# Patient Record
Sex: Male | Born: 1965 | Race: Black or African American | Hispanic: No | Marital: Single | Smoking: Current every day smoker
Health system: Southern US, Community
[De-identification: ages and names within clinical notes are randomized; demographics above are authoritative.]

## PROBLEM LIST (undated history)

## (undated) DIAGNOSIS — R262 Difficulty in walking, not elsewhere classified: Secondary | ICD-10-CM

## (undated) DIAGNOSIS — I69391 Dysphagia following cerebral infarction: Secondary | ICD-10-CM

## (undated) DIAGNOSIS — I10 Essential (primary) hypertension: Secondary | ICD-10-CM

## (undated) DIAGNOSIS — F17200 Nicotine dependence, unspecified, uncomplicated: Secondary | ICD-10-CM

## (undated) DIAGNOSIS — E119 Type 2 diabetes mellitus without complications: Secondary | ICD-10-CM

---

## 2019-09-09 DIAGNOSIS — E1165 Type 2 diabetes mellitus with hyperglycemia: Secondary | ICD-10-CM | POA: Insufficient documentation

## 2019-09-09 DIAGNOSIS — I10 Essential (primary) hypertension: Secondary | ICD-10-CM | POA: Insufficient documentation

## 2020-09-29 ENCOUNTER — Emergency Department: Payer: 59

## 2020-09-29 ENCOUNTER — Other Ambulatory Visit: Payer: Self-pay

## 2020-09-29 ENCOUNTER — Inpatient Hospital Stay
Admission: EM | Admit: 2020-09-29 | Discharge: 2020-10-06 | DRG: 064 | Disposition: A | Discharge status: Home / Self Care | Payer: 59 | Attending: Internal Medicine | Admitting: Internal Medicine

## 2020-09-29 ENCOUNTER — Encounter: Payer: Self-pay | Admitting: Emergency Medicine

## 2020-09-29 DIAGNOSIS — I43 Cardiomyopathy in diseases classified elsewhere: Secondary | ICD-10-CM | POA: Diagnosis present

## 2020-09-29 DIAGNOSIS — J90 Pleural effusion, not elsewhere classified: Secondary | ICD-10-CM | POA: Diagnosis present

## 2020-09-29 DIAGNOSIS — I639 Cerebral infarction, unspecified: Secondary | ICD-10-CM

## 2020-09-29 DIAGNOSIS — Z20822 Contact with and (suspected) exposure to covid-19: Secondary | ICD-10-CM | POA: Diagnosis present

## 2020-09-29 DIAGNOSIS — N1831 Chronic kidney disease, stage 3a: Secondary | ICD-10-CM | POA: Diagnosis present

## 2020-09-29 DIAGNOSIS — R4781 Slurred speech: Secondary | ICD-10-CM | POA: Diagnosis present

## 2020-09-29 DIAGNOSIS — Z7984 Long term (current) use of oral hypoglycemic drugs: Secondary | ICD-10-CM

## 2020-09-29 DIAGNOSIS — I63521 Cerebral infarction due to unspecified occlusion or stenosis of right anterior cerebral artery: Secondary | ICD-10-CM | POA: Diagnosis present

## 2020-09-29 DIAGNOSIS — R471 Dysarthria and anarthria: Secondary | ICD-10-CM | POA: Diagnosis present

## 2020-09-29 DIAGNOSIS — F17210 Nicotine dependence, cigarettes, uncomplicated: Secondary | ICD-10-CM | POA: Diagnosis present

## 2020-09-29 DIAGNOSIS — R1312 Dysphagia, oropharyngeal phase: Secondary | ICD-10-CM | POA: Diagnosis present

## 2020-09-29 DIAGNOSIS — R778 Other specified abnormalities of plasma proteins: Secondary | ICD-10-CM | POA: Diagnosis present

## 2020-09-29 DIAGNOSIS — I63421 Cerebral infarction due to embolism of right anterior cerebral artery: Secondary | ICD-10-CM | POA: Diagnosis not present

## 2020-09-29 DIAGNOSIS — I160 Hypertensive urgency: Secondary | ICD-10-CM | POA: Diagnosis present

## 2020-09-29 DIAGNOSIS — N179 Acute kidney failure, unspecified: Secondary | ICD-10-CM | POA: Diagnosis present

## 2020-09-29 DIAGNOSIS — Z9889 Other specified postprocedural states: Secondary | ICD-10-CM

## 2020-09-29 DIAGNOSIS — R7989 Other specified abnormal findings of blood chemistry: Secondary | ICD-10-CM | POA: Diagnosis present

## 2020-09-29 DIAGNOSIS — E1122 Type 2 diabetes mellitus with diabetic chronic kidney disease: Secondary | ICD-10-CM | POA: Diagnosis present

## 2020-09-29 DIAGNOSIS — N19 Unspecified kidney failure: Secondary | ICD-10-CM | POA: Diagnosis not present

## 2020-09-29 DIAGNOSIS — N289 Disorder of kidney and ureter, unspecified: Secondary | ICD-10-CM

## 2020-09-29 DIAGNOSIS — Z9114 Patient's other noncompliance with medication regimen: Secondary | ICD-10-CM

## 2020-09-29 DIAGNOSIS — I5031 Acute diastolic (congestive) heart failure: Secondary | ICD-10-CM | POA: Diagnosis present

## 2020-09-29 DIAGNOSIS — R296 Repeated falls: Secondary | ICD-10-CM | POA: Diagnosis present

## 2020-09-29 DIAGNOSIS — R29701 NIHSS score 1: Secondary | ICD-10-CM | POA: Diagnosis present

## 2020-09-29 DIAGNOSIS — I10 Essential (primary) hypertension: Secondary | ICD-10-CM

## 2020-09-29 DIAGNOSIS — R260 Ataxic gait: Secondary | ICD-10-CM | POA: Diagnosis present

## 2020-09-29 DIAGNOSIS — E870 Hyperosmolality and hypernatremia: Secondary | ICD-10-CM | POA: Diagnosis not present

## 2020-09-29 DIAGNOSIS — I130 Hypertensive heart and chronic kidney disease with heart failure and stage 1 through stage 4 chronic kidney disease, or unspecified chronic kidney disease: Secondary | ICD-10-CM | POA: Diagnosis present

## 2020-09-29 DIAGNOSIS — Z880 Allergy status to penicillin: Secondary | ICD-10-CM

## 2020-09-29 DIAGNOSIS — I509 Heart failure, unspecified: Secondary | ICD-10-CM

## 2020-09-29 DIAGNOSIS — N390 Urinary tract infection, site not specified: Secondary | ICD-10-CM | POA: Diagnosis present

## 2020-09-29 HISTORY — DX: Type 2 diabetes mellitus without complications: E11.9

## 2020-09-29 HISTORY — DX: Essential (primary) hypertension: I10

## 2020-09-29 LAB — TROPONIN I (HIGH SENSITIVITY)
Troponin I (High Sensitivity): 21 ng/L — ABNORMAL HIGH (ref <18)
Troponin I (High Sensitivity): 26 ng/L — ABNORMAL HIGH (ref <18)

## 2020-09-29 LAB — CBC
HCT: 44.3 % (ref 39.0–52.0)
Hemoglobin: 13.3 g/dL (ref 13.0–17.0)
MCH: 26.8 pg (ref 26.0–34.0)
MCHC: 30 g/dL (ref 30.0–36.0)
MCV: 89.3 fL (ref 80.0–100.0)
Platelets: 191 10*3/uL (ref 150–400)
RBC: 4.96 MIL/uL (ref 4.22–5.81)
RDW: 16.8 % — ABNORMAL HIGH (ref 11.5–15.5)
WBC: 6.6 10*3/uL (ref 4.0–10.5)
nRBC: 0 % (ref 0.0–0.2)

## 2020-09-29 LAB — URINALYSIS, COMPLETE (UACMP) WITH MICROSCOPIC
Bilirubin Urine: NEGATIVE
Glucose, UA: 50 mg/dL — AB
Ketones, ur: NEGATIVE mg/dL
Nitrite: NEGATIVE
Protein, ur: >=300 mg/dL — AB
Specific Gravity, Urine: 1.013 (ref 1.005–1.030)
WBC, UA: >50 WBC/hpf — ABNORMAL HIGH (ref 0–5)
pH: 5 (ref 5.0–8.0)

## 2020-09-29 LAB — COMPREHENSIVE METABOLIC PANEL
ALT: 38 U/L (ref 0–44)
AST: 28 U/L (ref 15–41)
Albumin: 2.5 g/dL — ABNORMAL LOW (ref 3.5–5.0)
Alkaline Phosphatase: 127 U/L — ABNORMAL HIGH (ref 38–126)
Anion gap: 10 (ref 5–15)
BUN: 29 mg/dL — ABNORMAL HIGH (ref 6–20)
CO2: 21 mmol/L — ABNORMAL LOW (ref 22–32)
Calcium: 7.9 mg/dL — ABNORMAL LOW (ref 8.9–10.3)
Chloride: 115 mmol/L — ABNORMAL HIGH (ref 98–111)
Creatinine, Ser: 1.72 mg/dL — ABNORMAL HIGH (ref 0.61–1.24)
GFR, Estimated: 46 mL/min — ABNORMAL LOW (ref >60)
Glucose, Bld: 108 mg/dL — ABNORMAL HIGH (ref 70–99)
Potassium: 4.6 mmol/L (ref 3.5–5.1)
Sodium: 146 mmol/L — ABNORMAL HIGH (ref 135–145)
Total Bilirubin: 0.4 mg/dL (ref 0.3–1.2)
Total Protein: 6.9 g/dL (ref 6.5–8.1)

## 2020-09-29 LAB — BRAIN NATRIURETIC PEPTIDE: B Natriuretic Peptide: 1264.5 pg/mL — ABNORMAL HIGH (ref 0.0–100.0)

## 2020-09-29 LAB — PROCALCITONIN: Procalcitonin: <0.1 ng/mL

## 2020-09-29 LAB — RESP PANEL BY RT-PCR (FLU A&B, COVID) ARPGX2
Influenza A by PCR: NEGATIVE
Influenza B by PCR: NEGATIVE
SARS Coronavirus 2 by RT PCR: NEGATIVE

## 2020-09-29 MED ORDER — FUROSEMIDE 10 MG/ML IJ SOLN
60.0000 mg | Freq: Once | INTRAMUSCULAR | Status: AC
  Administered 2020-09-29: 60 mg via INTRAVENOUS
  Filled 2020-09-29: qty 8

## 2020-09-29 MED ORDER — ACETAMINOPHEN 160 MG/5ML PO SOLN
650.0000 mg | ORAL | Status: DC | PRN
  Filled 2020-09-29: qty 20.3

## 2020-09-29 MED ORDER — POLYETHYLENE GLYCOL 3350 17 G PO PACK
17.0000 g | PACK | Freq: Every day | ORAL | Status: DC | PRN
  Filled 2020-09-29: qty 1

## 2020-09-29 MED ORDER — ALBUTEROL SULFATE (2.5 MG/3ML) 0.083% IN NEBU: 2.5000 mg | INHALATION_SOLUTION | RESPIRATORY_TRACT | Status: DC | PRN

## 2020-09-29 MED ORDER — SODIUM CHLORIDE 0.9 % IV SOLN
1.0000 g | INTRAVENOUS | Status: AC
  Administered 2020-09-29 – 2020-10-03 (×5): 1 g via INTRAVENOUS
  Filled 2020-09-29 (×5): qty 10

## 2020-09-29 MED ORDER — IOHEXOL 300 MG/ML  SOLN
75.0000 mL | Freq: Once | INTRAMUSCULAR | Status: AC | PRN
  Administered 2020-09-29: 75 mL via INTRAVENOUS
  Filled 2020-09-29: qty 75

## 2020-09-29 MED ORDER — HYDRALAZINE HCL 20 MG/ML IJ SOLN
10.0000 mg | Freq: Four times a day (QID) | INTRAMUSCULAR | Status: DC | PRN
  Administered 2020-10-01 – 2020-10-02 (×3): 10 mg via INTRAVENOUS
  Filled 2020-09-29 (×3): qty 1

## 2020-09-29 MED ORDER — LABETALOL HCL 5 MG/ML IV SOLN
INTRAVENOUS | Status: AC
  Administered 2020-09-29: 20 mg via INTRAVENOUS
  Filled 2020-09-29: qty 4

## 2020-09-29 MED ORDER — ASPIRIN 81 MG PO CHEW
324.0000 mg | CHEWABLE_TABLET | Freq: Once | ORAL | Status: AC
  Administered 2020-09-29: 324 mg via ORAL
  Filled 2020-09-29: qty 4

## 2020-09-29 MED ORDER — ACETAMINOPHEN 650 MG RE SUPP: 650.0000 mg | RECTAL | Status: DC | PRN

## 2020-09-29 MED ORDER — ACETAMINOPHEN 325 MG PO TABS: 650.0000 mg | ORAL_TABLET | ORAL | Status: DC | PRN

## 2020-09-29 MED ORDER — LABETALOL HCL 5 MG/ML IV SOLN: 20.0000 mg | Freq: Once | INTRAVENOUS | Status: AC

## 2020-09-29 MED ORDER — FUROSEMIDE 10 MG/ML IJ SOLN: 40.0000 mg | Freq: Once | INTRAMUSCULAR | Status: DC

## 2020-09-29 MED ORDER — STROKE: EARLY STAGES OF RECOVERY BOOK: Freq: Once | Status: AC

## 2020-09-29 MED ORDER — HYDRALAZINE HCL 20 MG/ML IJ SOLN
10.0000 mg | Freq: Once | INTRAMUSCULAR | Status: AC
  Administered 2020-09-29: 10 mg via INTRAVENOUS
  Filled 2020-09-29: qty 1

## 2020-09-29 NOTE — ED Provider Notes (Signed)
[facility] Emergency Department Provider Note  Time seen: 3:36 PM  I have reviewed the triage vital signs and the nursing notes.   HISTORY  Chief Complaint Off balance, dizzy  HPI Tommy Perez is a 55 y.o. male with a past medical history of hypertension who presents to the emergency department with off balance/dizziness. Cording to the patient over the past 2 weeks or so he has been very off balance having difficulty ambulating, significant others who states he is also been having trouble speaking at times. Patient is significantly hypertensive currently 212 systolic, states he has not taken his blood pressure medication in several weeks, does not recall which blood pressure medications he is taking. Patient denies any headache. Denies any chest pain or shortness of breath. No fever. Patient denies alcohol use, but is a daily smoker. No vomiting. Initially reported leg pain to the triage nurse however upon further explanation he states they are not hurting him but he feels like he cannot control them properly.   Past Medical History:  Diagnosis Date  . Hypertension     There are no problems to display for this patient.   History reviewed. No pertinent surgical history.  Prior to Admission medications   Not on File    Allergies  Allergen Reactions  . Penicillins     No family history on file.  Social History Social History   Tobacco Use  . Smoking status: Current Every Day Smoker    Types: Cigarettes  . Smokeless tobacco: Never Used  Substance Use Topics  . Alcohol use: Not Currently  . Drug use: Not Currently    Review of Systems Constitutional: Negative for fever. Positive for being off balance x2 weeks Cardiovascular: Negative for chest pain. Respiratory: Negative for shortness of breath. Gastrointestinal: Negative for abdominal pain, vomiting  Musculoskeletal: Negative for musculoskeletal complaints Neurological: Negative for headache.  Positive for being off balance. Occasional difficulty speaking per significant another. All other ROS negative  ____________________________________________   PHYSICAL EXAM:  VITAL SIGNS: ED Triage Vitals  Enc Vitals Group     BP 09/29/20 1412 (!) 212/117     Pulse Rate 09/29/20 1412 85     Resp 09/29/20 1412 16     Temp 09/29/20 1412 98.3 F (36.8 C)     Temp Source 09/29/20 1412 Oral     SpO2 09/29/20 1412 100 %     Weight 09/29/20 1410 170 lb (77.1 kg)     Height 09/29/20 1410 5\' 11"  (1.803 m)     Head Circumference --      Peak Flow --      Pain Score 09/29/20 1409 3     Pain Loc --      Pain Edu? --      Excl. in GC? --     Constitutional: Alert and oriented. Well appearing and in no distress. Eyes: Chronic strabismus. ENT      Head: Normocephalic and atraumatic.      Mouth/Throat: Mucous membranes are moist. Cardiovascular: Normal rate, regular rhythm.  Respiratory: Normal respiratory effort without tachypnea nor retractions. Breath sounds are clear  Gastrointestinal: Soft and nontender. No distention.   Musculoskeletal: 1+ lower extremity edema. Nontender to palpation. Neurologic:  Normal speech and language. Equal grip strength bilaterally. No pronator drift. 5/5 motor in bilateral upper extremities, 4+/5 motor in bilateral lower extremities. Mild difficulty with finger-to-nose testing but overall intact. No cranial nerve deficits noted. Skin:  Skin is warm, dry and intact.  Psychiatric: Mood and affect are normal.  ____________________________________________    EKG  EKG viewed and interpreted by myself shows a normal sinus rhythm 84 bpm with a narrow QRS, normal axis, normal intervals, no concerning ST changes.  ____________________________________________    RADIOLOGY  CT scan of the head is negative. Chest x-ray shows moderate right pleural effusion with patchy opacities could represent  pneumonia.  ____________________________________________   INITIAL IMPRESSION / ASSESSMENT AND PLAN / ED COURSE  Pertinent labs & imaging results that were available during my care of the patient were reviewed by me and considered in my medical decision making (see chart for details).   Patient presents to the emergency department with multiple symptoms including feeling off balance, difficulty ambulating occasional difficulty speaking over the past 2 weeks. Also states he has been off his blood pressure medication for some time at least several weeks. Patient is significantly hypertensive 212 systolic over 120 diastolic. Patient given hydralazine currently 210/120. We will dose IV labetalol. Patient's lab work shows renal insufficiency, no old lab work seen in care everywhere or in our system for comparison. CBC is largely within normal limits. Troponin slightly elevated at 21, not significantly so and could be explained by the renal insufficiency. Given the patient's ataxia with significant hypertension I ordered a CT scan to evaluate for CVA, bleed or press. CT is overall negative however given the patient's continued ataxia we will proceed with MRI for further evaluation. Patient denies any significant cough no fever, he is a daily smoker and has mild rhonchi on lung exam we will obtain a CT scan with contrast to rule out oncologic or other intrathoracic process. Patient agreeable.  MRI confirms acute to subacute infarct.  Blood pressure remains 210/120, however given acute infarct we will proceed with permissive hypertension.  CT scan is somewhat indeterminate, BNP is quite elevated with peripheral edema highly suspect a degree of interstitial edema over infectious etiology given negative Covid normal white blood cell count no cough fever or other signs of pneumonia.  We will dose 60 mg of IV Lasix.  We will dose aspirin given acute infarct.  Patient agreeable to admission for further work-up and  treatment.  Donnelle Olmeda was evaluated in Emergency Department on 09/29/2020 for the symptoms described in the history of present illness. He was evaluated in the context of the global COVID-19 pandemic, which necessitated consideration that the patient might be at risk for infection with the SARS-CoV-2 virus that causes COVID-19. Institutional protocols and algorithms that pertain to the evaluation of patients at risk for COVID-19 are in a state of rapid change based on information released by regulatory bodies including the CDC and federal and state organizations. These policies and algorithms were followed during the patient's care in the ED.  ____________________________________________   FINAL CLINICAL IMPRESSION(S) / ED DIAGNOSES  Hypertension Pleural effusion Ataxia CVA   Minna Antis, MD 09/29/20 1754

## 2020-09-29 NOTE — ED Notes (Signed)
Lab staff in room to draw blood work at this time.

## 2020-09-29 NOTE — ED Notes (Signed)
Pt reports he has missed his home medications x 2 weeks. Pt has bottles of medication in his possession in room and verified that he only takes Hydralazine, Metformin, and Jardiance currently.

## 2020-09-29 NOTE — ED Notes (Signed)
Reference triage note. Pt reports bilateral leg pain. Pt denies shortness of breath or weakness at this time. Pt reports hx of high bp, has not taken medications this am.

## 2020-09-29 NOTE — ED Notes (Signed)
MD Shalhoub at bedside 

## 2020-09-29 NOTE — ED Notes (Signed)
Date and time results received: 09/29/20 6:50 (use smartphrase ".now" to insert current time)  Test: BP Critical Value: 201/112  Name of Provider Notified: Dr. Leafy Half  Orders Received? Or Actions Taken?: Acknowledged-Do not treat due to stroke

## 2020-09-29 NOTE — ED Triage Notes (Signed)
Arrives with C/O bilateral leg pain x 2 weeks.  Denies CP and SOB.

## 2020-09-30 ENCOUNTER — Observation Stay
Admit: 2020-09-30 | Discharge: 2020-09-30 | Disposition: A | Discharge status: Home / Self Care | Payer: 59 | Attending: Internal Medicine | Admitting: Internal Medicine

## 2020-09-30 ENCOUNTER — Observation Stay: Payer: 59

## 2020-09-30 ENCOUNTER — Encounter: Payer: Self-pay | Admitting: Internal Medicine

## 2020-09-30 DIAGNOSIS — R471 Dysarthria and anarthria: Secondary | ICD-10-CM | POA: Diagnosis present

## 2020-09-30 DIAGNOSIS — N179 Acute kidney failure, unspecified: Secondary | ICD-10-CM | POA: Diagnosis present

## 2020-09-30 DIAGNOSIS — E870 Hyperosmolality and hypernatremia: Secondary | ICD-10-CM | POA: Diagnosis not present

## 2020-09-30 DIAGNOSIS — R4781 Slurred speech: Secondary | ICD-10-CM | POA: Diagnosis present

## 2020-09-30 DIAGNOSIS — R778 Other specified abnormalities of plasma proteins: Secondary | ICD-10-CM

## 2020-09-30 DIAGNOSIS — R29701 NIHSS score 1: Secondary | ICD-10-CM | POA: Diagnosis present

## 2020-09-30 DIAGNOSIS — F17210 Nicotine dependence, cigarettes, uncomplicated: Secondary | ICD-10-CM | POA: Diagnosis present

## 2020-09-30 DIAGNOSIS — N19 Unspecified kidney failure: Secondary | ICD-10-CM | POA: Diagnosis present

## 2020-09-30 DIAGNOSIS — I43 Cardiomyopathy in diseases classified elsewhere: Secondary | ICD-10-CM | POA: Diagnosis present

## 2020-09-30 DIAGNOSIS — I130 Hypertensive heart and chronic kidney disease with heart failure and stage 1 through stage 4 chronic kidney disease, or unspecified chronic kidney disease: Secondary | ICD-10-CM | POA: Diagnosis present

## 2020-09-30 DIAGNOSIS — Z7984 Long term (current) use of oral hypoglycemic drugs: Secondary | ICD-10-CM | POA: Diagnosis not present

## 2020-09-30 DIAGNOSIS — N390 Urinary tract infection, site not specified: Secondary | ICD-10-CM | POA: Diagnosis present

## 2020-09-30 DIAGNOSIS — I63421 Cerebral infarction due to embolism of right anterior cerebral artery: Secondary | ICD-10-CM | POA: Diagnosis present

## 2020-09-30 DIAGNOSIS — R260 Ataxic gait: Secondary | ICD-10-CM | POA: Diagnosis present

## 2020-09-30 DIAGNOSIS — I160 Hypertensive urgency: Secondary | ICD-10-CM | POA: Diagnosis present

## 2020-09-30 DIAGNOSIS — I509 Heart failure, unspecified: Secondary | ICD-10-CM | POA: Diagnosis not present

## 2020-09-30 DIAGNOSIS — J90 Pleural effusion, not elsewhere classified: Secondary | ICD-10-CM | POA: Diagnosis not present

## 2020-09-30 DIAGNOSIS — I5031 Acute diastolic (congestive) heart failure: Secondary | ICD-10-CM | POA: Diagnosis present

## 2020-09-30 DIAGNOSIS — E1122 Type 2 diabetes mellitus with diabetic chronic kidney disease: Secondary | ICD-10-CM | POA: Diagnosis present

## 2020-09-30 DIAGNOSIS — I63521 Cerebral infarction due to unspecified occlusion or stenosis of right anterior cerebral artery: Secondary | ICD-10-CM | POA: Diagnosis not present

## 2020-09-30 DIAGNOSIS — R1312 Dysphagia, oropharyngeal phase: Secondary | ICD-10-CM | POA: Diagnosis present

## 2020-09-30 DIAGNOSIS — Z20822 Contact with and (suspected) exposure to covid-19: Secondary | ICD-10-CM | POA: Diagnosis present

## 2020-09-30 DIAGNOSIS — R296 Repeated falls: Secondary | ICD-10-CM | POA: Diagnosis present

## 2020-09-30 DIAGNOSIS — N1831 Chronic kidney disease, stage 3a: Secondary | ICD-10-CM | POA: Diagnosis present

## 2020-09-30 DIAGNOSIS — Z880 Allergy status to penicillin: Secondary | ICD-10-CM | POA: Diagnosis not present

## 2020-09-30 DIAGNOSIS — Z9114 Patient's other noncompliance with medication regimen: Secondary | ICD-10-CM | POA: Diagnosis not present

## 2020-09-30 LAB — LIPID PANEL
Cholesterol: 173 mg/dL (ref 0–200)
HDL: 66 mg/dL (ref >40)
LDL Cholesterol: 87 mg/dL (ref 0–99)
Total CHOL/HDL Ratio: 2.6 RATIO
Triglycerides: 99 mg/dL (ref <150)
VLDL: 20 mg/dL (ref 0–40)

## 2020-09-30 LAB — GLUCOSE, CAPILLARY
Glucose-Capillary: 86 mg/dL (ref 70–99)
Glucose-Capillary: 85 mg/dL (ref 70–99)
Glucose-Capillary: 86 mg/dL (ref 70–99)
Glucose-Capillary: 101 mg/dL — ABNORMAL HIGH (ref 70–99)

## 2020-09-30 LAB — SODIUM, URINE, RANDOM: Sodium, Ur: 90 mmol/L

## 2020-09-30 LAB — HIV ANTIBODY (ROUTINE TESTING W REFLEX): HIV Screen 4th Generation wRfx: NONREACTIVE

## 2020-09-30 LAB — C-REACTIVE PROTEIN: CRP: 1 mg/dL — ABNORMAL HIGH (ref <1.0)

## 2020-09-30 LAB — TROPONIN I (HIGH SENSITIVITY): Troponin I (High Sensitivity): 28 ng/L — ABNORMAL HIGH (ref <18)

## 2020-09-30 LAB — HEMOGLOBIN A1C
Hgb A1c MFr Bld: 8.4 % — ABNORMAL HIGH (ref 4.8–5.6)
Mean Plasma Glucose: 194.38 mg/dL

## 2020-09-30 LAB — PROTEIN / CREATININE RATIO, URINE
Creatinine, Urine: 40 mg/dL
Protein Creatinine Ratio: 9.48 mg/mg{Cre} — ABNORMAL HIGH (ref 0.00–0.15)
Total Protein, Urine: 379 mg/dL

## 2020-09-30 LAB — CREATININE, URINE, RANDOM: Creatinine, Urine: 38 mg/dL

## 2020-09-30 MED ORDER — GADOBUTROL 1 MMOL/ML IV SOLN
7.0000 mL | Freq: Once | INTRAVENOUS | Status: AC | PRN
  Administered 2020-09-30: 7 mL via INTRAVENOUS

## 2020-09-30 MED ORDER — FUROSEMIDE 10 MG/ML IJ SOLN
40.0000 mg | Freq: Two times a day (BID) | INTRAMUSCULAR | Status: DC
  Administered 2020-09-30 – 2020-10-01 (×4): 40 mg via INTRAVENOUS
  Filled 2020-09-30 (×4): qty 4

## 2020-09-30 MED ORDER — INSULIN ASPART 100 UNIT/ML ~~LOC~~ SOLN
0.0000 [IU] | Freq: Three times a day (TID) | SUBCUTANEOUS | Status: DC
  Administered 2020-10-01: 2 [IU] via SUBCUTANEOUS
  Administered 2020-10-02: 3 [IU] via SUBCUTANEOUS
  Administered 2020-10-02: 2 [IU] via SUBCUTANEOUS
  Administered 2020-10-03: 3 [IU] via SUBCUTANEOUS
  Filled 2020-09-30 (×5): qty 1

## 2020-09-30 MED ORDER — ASPIRIN EC 81 MG PO TBEC
81.0000 mg | DELAYED_RELEASE_TABLET | Freq: Every day | ORAL | Status: DC
  Administered 2020-09-30 – 2020-10-06 (×7): 81 mg via ORAL
  Filled 2020-09-30 (×7): qty 1

## 2020-09-30 MED ORDER — HYDROCERIN EX CREA
TOPICAL_CREAM | Freq: Two times a day (BID) | CUTANEOUS | Status: DC
  Filled 2020-09-30 (×2): qty 113

## 2020-09-30 NOTE — Progress Notes (Signed)
*  PRELIMINARY RESULTS* Echocardiogram 2D Echocardiogram has been performed.  Tommy Perez 09/30/2020, 12:00 PM

## 2020-09-30 NOTE — Consult Note (Signed)
Neurology Consultation Reason for Consult: Stroke on MRI iso gait difficulty Requesting Physician: Esaw Grandchild  CC: Difficulty walking  History is obtained from: Patient and chart review  HPI: Tommy Perez is a 55 y.o. male with past medical history significant for hypertension (poorly controlled, blood pressures typically in the 170s at home), diabetes (poorly controlled, A1c 9.9% per [facility] cardiology note), ongoing tobacco abuse (1 pack/day, 40-pack-year history), prior substance abuse, nonadherence to medical treatment.  Patient reports that he has been having some difficulty with his walking for the past 2 weeks or perhaps even 3 weeks.  He has not noticed any specific weakness of the right or left leg.  He has not had any numbness that he has noticed.  On review of systems he reports he had a runny nose 2 weeks ago and was vaccinated against COVID-19 in November 2021 with a J&J vaccine.  He does report he has lost 10 pounds in the last 2 months without trying and that he has been having some swelling of his bilateral lower extremities for a few weeks as well.  He has a chronic lazy eye and sees better out of his right eye.   Review of systems is negative otherwise, including denying bowel or bladder issues, changes in sensation.  He was recently seen by cardiology 09/05/2019, Dr. Laruth Bouchard at [facility], who planned an echocardiogram, consideration for potential stress test  LKW: 2 to 3 weeks prior to admission tPA given?: No, due to out of the window Premorbid modified rankin scale:      0 - No symptoms.  ROS: A 14 point ROS was performed and is negative except as noted in the HPI.   Past Medical History:  Diagnosis Date  . Diabetes mellitus, type 2 (HCC)   . Hypertension    History reviewed. No pertinent surgical history.  Current Meds  Medication Sig  . empagliflozin (JARDIANCE) 10 MG TABS tablet Take 10 mg by mouth daily.  . hydrALAZINE (APRESOLINE) 50 MG tablet Take 50 mg by  mouth in the morning and at bedtime.  . metFORMIN (GLUCOPHAGE) 1000 MG tablet Take 1,000 mg by mouth in the morning and at bedtime.     Family History  Problem Relation Age of Onset  . Heart attack Neg Hx     Social History:  reports that he has been smoking cigarettes. He has been smoking about 1.00 pack per day. He has never used smokeless tobacco. He reports previous alcohol use. He reports previous drug use.   He enjoys drag racing which he does about twice a week and enjoys watching the [employer] play football.  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  He reports he is cutting back on his smoking and has reduced from 1 pack/day to half a pack a day.  He is encouraged that he has not smoked at all this admission but notes that the fact that family members in the home continue to smoke will be a barrier to quitting in the future.  Exam: Current vital signs: BP (!) 171/93 (BP Location: Left Arm)   Pulse 83   Temp 97.8 F (36.6 C)   Resp 18   Ht 5\' 11"  (1.803 m)   Wt 77.1 kg   SpO2 96%   BMI 23.71 kg/m  Vital signs in last 24 hours: Temp:  [97.8 F (36.6 C)-98.3 F (36.8 C)] 97.8 F (36.6 C) (02/09 0723) Pulse Rate:  [72-87] 83 (02/09 0723)  Resp:  [15-20] 18 (02/09 0723) BP: (171-212)/(89-123) 171/93 (02/09 0723) SpO2:  [95 %-100 %] 96 % (02/09 0723) Weight:  [77.1 kg] 77.1 kg (02/08 1410)   Physical Exam  Constitutional: Appears thin and older than stated age Psych: Affect appropriate to situation Eyes: No scleral injection, chronic lazy eye. HENT: No OP obstruction, poor dentition MSK: no joint deformities.  Cardiovascular: Normal rate and regular rhythm.  Respiratory: Effort normal, non-labored breathing GI: Soft.  No distension. There is no tenderness.  Skin: WDI  Neuro: Mental Status: Patient is awake, alert, oriented to person, place, month, year, and situation.  However he struggles to name the current president, initially answering been  loud and then self correcting to biting.  He is able to remember Trump but not Obama.  He has difficulty with short-term memory, requiring 3 repetitions before he is able to repeat back to me the new medications that are being started (aspirin, Plavix, Lipitor) No signs of aphasia or neglect Cranial Nerves: II: Visual Fields are full. Pupils are equal, round, and reactive to light.   III,IV, VI: Chronically lazy eye, full ductions with each individual eye.   V: Facial sensation is symmetric to temperature VII: Facial movement is symmetric.  VIII: hearing is intact to voice X: Uvula elevates symmetrically XI: Shoulder shrug is symmetric. XII: tongue is midline without atrophy or fasciculations.  Motor: Tone is normal. Bulk is normal. 5/5 strength was present in all four extremities, other than bilateral hip flexor weakness 4/5 and left leg knee flexion weakness 4/5 Sensory: Sensation is symmetric to light touch and temperature in the arms and legs. Deep Tendon Reflexes: 2+ bilateral biceps, 2+ left brachioradialis, 1+ right brachioradialis, 2+ left ankle, 0 right ankle Plantars: Toes are mute bilaterally Cerebellar: FNF and HKS are intact other than some difficulty with the left lower extremity  NIHSS total 1 Score breakdown: Left lower extremity ataxia  I have reviewed labs in epic and the results pertinent to this consultation are:  Creatinine 1.72, GFR 46  CBC notable for mildly increased RDW  BNP 1264 Troponin 21, 26, 28 on serial checks TSH elevated at 5.87,  Urinalysis notable for rare bacteria, white blood cells, hyaline casts, small leukocytes, negative nitrites, proteinuria, glucosuria, hemoglobinuria  Lab Results  Component Value Date   HGBA1C 8.4 (H) 09/30/2020   Lab Results  Component Value Date   CHOL 173 09/30/2020   HDL 66 09/30/2020   LDLCALC 87 09/30/2020   TRIG 99 09/30/2020   CHOLHDL 2.6 09/30/2020    I have reviewed the images obtained: MRI brain  w/ small stroke in the right ACA territory, chronic microvascular disease fairly advanced for age, a few microhemorrhages    MRA head and MRA neck personally reviewed, no significant atherosclerosis  ECHO:  1. Negative bubble study.  2. Left ventricular ejection fraction, by estimation, is 55 to 60%. The  left ventricle has normal function. The left ventricle has no regional  wall motion abnormalities. The left ventricular internal cavity size was  mildly dilated. There is mild  concentric left ventricular hypertrophy. Left ventricular diastolic  parameters are consistent with Grade I diastolic dysfunction (impaired  relaxation).  3. Right ventricular systolic function is normal. The right ventricular  size is normal.  4. The mitral valve is normal in structure. Trivial mitral valve  regurgitation.  5. The aortic valve is normal in structure. Aortic valve regurgitation is  not visualized.  6. Aortic dilatation noted and Mild dilatation.  Normal biatrial  sizes  Impression: This is a pleasant 55 year old man with vascular risk factors as above presenting with a small stroke that may be expected to cause some left leg weakness.  However his exam is notable for bilateral hip flexion weakness, though knee flexion is weaker on the left than the right, and some asymmetric reflexes that are not adequately explained. In this setting, will obtain MRI spine to evaluate for compressive etiologies of his gait impairment. Additionally will assess for reversible causes of cognitive impairment.   Recommendations: # Right ACA territory (cingulate gyrus), embolic of unknown source - Stroke labs HgbA1c, fasting lipid panel (A1c 8.4, LDL 87) - MRI brain completed as above - MRA of the brain without contrast and MRA neck w/wo completed as above - Frequent neuro checks - Echocardiogram completed, read as above - Prophylactic therapy-Antiplatelet med: Aspirin - dose 325mg  PO or 300mg  PR, followed by  81 mg daily - Consider Plavix 300 mg load with 75 mg daily for 21 - 90 day course  - Atorvastatin 40 mg nightly - Risk factor modification, hemoglobin A1c goal less than 7, lipid goal less than 70, medication adherence, diet, exercise, smoking cessation counseling performed - Telemetry monitoring; 30 day event monitor on discharge if no arrythmias captured  - Blood pressure goal  - Normotension, to be achieved gradually over 3-5 days  - PT consult, OT consult, Speech consult, appreciate recommendation for CIR  - Outpatient neurology follow-up  # Bilateral lower extremity weakness -MRI C-spine, T-spine and L-spine -Should these studies be negative, no further inpatient neurological workup is needed.   # Impaired short-term memory  -B12 level ordered, pending -Thiamine level ordered, pending -TSH elevated, free T4 pending -RPR ordered, pending -HIV negative   MD-PhD [facility] [phone]

## 2020-09-30 NOTE — Hospital Course (Addendum)
55 year old male with past medical history of hypertension, diabetes mellitus type 2, nicotine dependence and medication noncompliance who presented to the ED on 09/29/20 with complaints of unsteady gait with frequent falls and slurred speech.  Symptoms had started 2-3 weeks prior but patient did not seek any medical attention.  MRI brain showed an area of restricted diffusion in the right anterior angulate gyrus, consistent with acute/subacute stroke.

## 2020-09-30 NOTE — Consult Note (Signed)
WOC Nurse Consult Note: Reason for Consult:Dry flaking skin on bilateral LEs, hemosiderin staining consistent with venous insufficiency. Two or three very small open areas, partial and full thickness, <0.4cm round x 0.1cm deep. Heels intact. Elongated toenails (particularly RGT) in need of debridement. Wound type: N/A Pressure Injury POA: N/A Measurement:N/A Wound BUL:AGTX, dry Drainage (amount, consistency, odor) None Periwound:hemosiderin staining bilaterally Dressing procedure/placement/frequency: I have provided input to the POC for twice daily Eucerin cream application after soap and water cleanse and gently patting skin dry. Heels are to be floated and a prophylactic foam dressing placed to the sacrum. I advised Nursing to turn patient side to side.  If desired, consult with podiatric medicine for toenail debridement either inpatient or refer for outpatient consultation.  WOC nursing team will not follow, but will remain available to this patient, the nursing and medical teams.  Please re-consult if needed. Thanks, Ladona Mow, MSN, RN, GNP, [place]  Pager# [phone]

## 2020-09-30 NOTE — Evaluation (Signed)
Occupational Therapy Evaluation Patient Details Name: Tommy Perez MRN: [id number] DOB: 01-18-1966 Today's Date: 09/30/2020    History of Present Illness presented to ER secondary to 2-3 week history of unsteady gait, slurred speech and multiple falls; admitted for TIA/CVA work up.  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   Clinical Impression   Patient presenting with decreased I in self care, balance, functional mobility/transfer, endurance, and safety awareness. Patient reports living with roommates and working full time in [employer]. Patient currently functioning and min A - mod A for functional mobility and self care. Pt's HR increased to 170's while standing at sink for grooming tasks. Pt's HR returned to 120's once seated. Patient will benefit from acute OT to increase overall independence in the areas of ADLs, functional mobility, and safety awareness in order to safely discharge to next venue of care.    Follow Up Recommendations  CIR;Supervision/Assistance - 24 hour    Equipment Recommendations  Other (comment) (defer to next venue of care)       Precautions / Restrictions Precautions Precautions: Fall Precaution Comments: elevated HR in 170s      Mobility Bed Mobility Overal bed mobility: Needs Assistance Bed Mobility: Supine to Sit;Sit to Supine     Supine to sit: Supervision Sit to supine: Supervision   General bed mobility comments: min cuing for technique    Transfers Overall transfer level: Needs assistance Equipment used: None Transfers: Sit to/from Stand Sit to Stand: Min assist;Mod assist         General transfer comment: unsteady and demonstrates furniture walking in the room with cuing for technique    Balance Overall balance assessment: Needs assistance Sitting-balance support: No upper extremity supported;Feet supported Sitting  balance-Leahy Scale: Fair     Standing balance support: Bilateral upper extremity supported Standing balance-Leahy Scale: Poor                             ADL either performed or assessed with clinical judgement   ADL Overall ADL's : Needs assistance/impaired     Grooming: Wash/dry hands;Wash/dry face;Standing;Moderate assistance Grooming Details (indicate cue type and reason): mod A for standing balance with increased time to complete tasks                 Toilet Transfer: Moderate assistance Toilet Transfer Details (indicate cue type and reason): simulated transfer                 Vision Patient Visual Report: No change from baseline              Pertinent Vitals/Pain Pain Assessment: No/denies pain     Hand Dominance Right   Extremity/Trunk Assessment Upper Extremity Assessment Upper Extremity Assessment: Overall WFL for tasks assessed   Lower Extremity Assessment Lower Extremity Assessment: Overall WFL for tasks assessed       Communication Communication Communication: No difficulties   Cognition Arousal/Alertness: Awake/alert Behavior During Therapy: WFL for tasks assessed/performed Overall Cognitive Status: Within Functional Limits for tasks assessed                                 General Comments: oriented to self, location as hospital; aware of year, and does report correct month learned from prior session. Follows simple commands, but poor recall of new information,  poor insight and awareness of deficits              Home Living Family/patient expects to be discharged to:: Private residence Living Arrangements: Non-relatives/Friends Available Help at Discharge: Friend(s);Available 24 hours/day Type of Home: House Home Access: Stairs to enter [employer] of Steps: 1   Home Layout: One level     Bathroom Shower/Tub: Tub/shower unit         Home Equipment: None      Lives With: Other  (Comment) (per pt lives with 3 roommates, one of whom is girlfriend?)    Prior Functioning/Environment Level of Independence: Independent        Comments: Pt reports independence with all aspects of care, he drives, and 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        OT Problem List: Decreased strength;Decreased activity tolerance;Impaired balance (sitting and/or standing);Decreased safety awareness;Decreased knowledge of use of DME or AE;Decreased coordination;Decreased cognition      OT Treatment/Interventions: Self-care/ADL training;Manual therapy;Therapeutic exercise;Energy conservation;DME and/or AE instruction;Cognitive remediation/compensation;Therapeutic activities;Balance training;Patient/family education;Neuromuscular education    OT Goals(Current goals can be found in the care plan section) Acute Rehab OT Goals Patient Stated Goal: to be independent again OT Goal Formulation: With patient Time For Goal Achievement: 10/14/20 Potential to Achieve Goals: Good ADL Goals Pt Will Perform Grooming: with supervision;standing Pt Will Perform Lower Body Dressing: with supervision;sit to/from stand Pt Will Transfer to Toilet: with supervision;ambulating Pt Will Perform Toileting - Clothing Manipulation and hygiene: with supervision;sit to/from stand Pt Will Perform Tub/Shower Transfer: with supervision;ambulating  OT Frequency: Min 3X/week   Barriers to D/C: Other (comment)  none known known at this time          AM-PAC OT "6 Clicks" Daily Activity     Outcome Measure Help from another person eating meals?: A Little Help from another person taking care of personal grooming?: A Little Help from another person toileting, which includes using toliet, bedpan, or urinal?: A Lot Help from another person bathing (including washing, rinsing, drying)?: A Lot Help from another person to put on and taking off regular upper body clothing?: A Little Help from another person to put  on and taking off regular lower body clothing?: A Lot 6 Click Score: 15   End of Session Nurse Communication: Mobility status  Activity Tolerance: Patient tolerated treatment well Patient left: in bed;with call bell/phone within reach;with family/visitor present  OT Visit Diagnosis: Unsteadiness on feet (R26.81);Muscle weakness (generalized) (M62.81)                Time: 9678-9381 OT Time Calculation (min): 21 min Charges:  OT General Charges $OT Visit: 1 Visit OT Evaluation $OT Eval Moderate Complexity: 1 Mod OT Treatments $Self Care/Home Management : 8-22 mins  Jackquline Denmark, MS, OTR/L , CBIS ascom [phone]  09/30/20, 5:02 PM

## 2020-09-30 NOTE — Plan of Care (Signed)
  Problem: Education: Goal: Knowledge of disease or condition will improve Outcome: Progressing Goal: Knowledge of secondary prevention will improve Outcome: Progressing Goal: Knowledge of patient specific risk factors addressed and post discharge goals established will improve Outcome: Progressing   Problem: Self-Care: Goal: Ability to participate in self-care as condition permits will improve Outcome: Progressing   

## 2020-09-30 NOTE — Progress Notes (Signed)
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) 09/29/2020   AKI (acute kidney injury) (HCC) 09/29/2020   Type 2 diabetes mellitus with stage 3a chronic kidney disease, without long-term current use of insulin (HCC) 09/29/2020   Hypertensive urgency 09/29/2020   Pleural effusion, bilateral 09/29/2020   Acute congestive heart failure (HCC) 09/29/2020   Elevated troponin level not due myocardial infarction 09/29/2020   Nicotine dependence, cigarettes, uncomplicated 09/29/2020   Complicated UTI (urinary tract infection) 09/29/2020   Essential hypertension 09/09/2019   Type 2 diabetes mellitus with hyperglycemia (HCC) 09/09/2019   Past Medical History:  Past Medical History:  Diagnosis Date   Diabetes mellitus, type 2 (HCC)    Hypertension    Past Surgical History: History reviewed. No pertinent surgical history. HPI:  55 y.o. male who presented to ED with 2-3 week history of unsteady gait, slurred speech and multiple falls. MRI showed acute/subacute R cingulate gyrus infarct, remote bilateral cerebellar, R pons, L thalamus and bilateral centrum semiovale infarcts. Chest CT 09/29/20 showed bilateral pleural effusions, R>L, scattered bilateral ground-glass airspace disease greatest in RUL, dense R middle and R lower lobe consolidation, mucoid material within R mainstem brochus and bronchus intermedius.   Assessment / Plan / Recommendation Clinical Impression   Patient presents with significant cognitive communication impairments and dysarthria. Deficits are noted with sustained attention, recall, simple problem solving (and thus higher level functions). Patient also has reduced awareness of, insight into implications  of deficits. He was unable to describe reason for admit beyond "my legs were heavy," and when asked if he understood he'd had a stroke pt affirmed this but did not appear to have understanding of condition. Administered SLUMS for assessment of cognition; pt scored 9/30 (>26 is WNL). Patient was receptive to teachback using error confrontation and agreed that he would have performed better on assessments prior to CVA. Voice is low intensity, with crackling respirations and intermittent wet vocal quality; intelligibility is ~85% for this trained listener in conversation. Per chart review pt passed nursing swallow screen and is on regular diet, no SLP swallow evaluation ordered. Pt sipped thin liquids during evaluation with intermittent coughing and increased WOB. D/w patient and MD, plan for MBS for objective swallow testing next date. Continue current diet pending MBS however if respiratory status decline or fever advise NPO. Patient would benefit from intensive ST services to maximize safety and independence and return to PLOF post-d/c.    SLP Assessment  SLP Recommendation/Assessment: Patient needs continued Speech Lanaguage Pathology Services SLP Visit Diagnosis: Cognitive communication deficit (R41.841);Dysarthria and anarthria (R47.1)    Follow Up Recommendations  Inpatient Rehab    Frequency and Duration min 3x week  2 weeks      SLP Evaluation Cognition  Overall Cognitive Status: No family/caregiver present to determine baseline cognitive functioning Arousal/Alertness: Awake/alert Orientation Level: Oriented to person;Oriented to place Attention: Sustained Sustained Attention: Impaired Sustained Attention Impairment: Verbal complex (difficulty sustaining attn to divergent naming task) Memory: Impaired Memory Impairment: Decreased recall of new information;Decreased short term memory;Prospective memory Decreased Short Term Memory: Verbal basic Immediate Memory Recall:  (recalled 5/5  items immediately, 1/5 with 2 min delay) Awareness: Impaired Awareness Impairment: Intellectual impairment (initially denied cognitive changes, accepted with error confrontation/cues) Problem Solving: Impaired Problem Solving  Impairment: Verbal basic (simple $ problems 0/3) Executive Function:  (impaired due to lower level deficits (attn, memory)) Behaviors: Other (comment) (was pleasant and cooperative with all tasks) Safety/Judgment: Impaired ("am I still going to be here tomorrow?")       Comprehension  Auditory Comprehension Overall Auditory Comprehension: Impaired Yes/No Questions: Within Functional Limits Commands: Impaired Multistep Basic Commands: Other (comment) (needs repetition, chunking, due to attn/recall vs comprehension) Conversation: Simple Interfering Components: Attention;Processing speed;Working Radio broadcast assistant: Wellsite geologist: Not tested Reading Comprehension Reading Status: Not tested    Expression Expression Primary Mode of Expression: Verbal Verbal Expression Overall Verbal Expression: Appears within functional limits for tasks assessed Level of Generative/Spontaneous Verbalization: Conversation Naming:  (divergent naming 6 animals 60 seconds, reduced attn, perseveration) Interfering Components: Attention Written Expression Dominant Hand: Right Written Expression: Not tested (able to write #s 1-12)   Oral / Motor  Oral Motor/Sensory Function Overall Oral Motor/Sensory Function: Within functional limits Motor Speech Overall Motor Speech: Impaired Phonation: Low vocal intensity Articulation: Impaired Level of Impairment: Word Intelligibility: Intelligibility reduced Conversation: 75-100% accurate Motor Planning: Witnin functional limits Effective Techniques: Slow rate   GO                   Rondel Baton, MS, CCC-SLP Speech-Language  Pathologist  Arlana Lindau 09/30/2020, 1:56 PM

## 2020-09-30 NOTE — H&P (Signed)
History and Physical    Natasha Paulson XKG:[id number] DOB: June 15, 1966 DOA: 09/29/2020  PCP: Patient, No Pcp Per  Patient coming from: Home   Chief Complaint:  Chief Complaint  Patient presents with  . Leg Pain     HPI:    55 year old male with past medical history of hypertension, diabetes mellitus type 2, nicotine dependence and medication noncompliance who presents to [facility] with complaints of unsteady gait and slurred speech.  Patient is an extremely poor historian and portions of the history have been obtained from patient significant other at the bedside.  Patient explains that approximately 3 weeks ago he began to experience an unsteady gait.  Patient significant other reports the patient has fallen several times since requiring assistance from her and other friends.  Patient has not experienced any loss of consciousness.  With patient's unsteady gait, patient began to experience slurring of his words at approximately the same time.  Patient denies coughing when attempting to swallow foods or liquids.  Patient denies drooling.  Patient denies focal weakness or changes in vision.  Patient is neurologic this is continued to persist over the next 2 to 3 weeks.  Patient did not seek medical attention over the span of time.  Due to patient's ongoing symptoms of slurred speech and unsteady gait the patient eventually presented to [facility] emergency department for evaluation.  Upon evaluation in the emergency department, patient was found presenting many days/weeks after the onset of symptoms and therefore was not felt to be in the TPA window.  On MRI brain, patient was found to have an area of restricted diffusion in the right anterior angulate gyrus.  Patient was found to have an elevated creatinine of 1.72 of unclear chronicity.  Patient was also found to have a BNP of 1264.  The hospitalist group was then called to assess the patient for mission to the hospital.  Review of Systems:   Review of  Systems  Neurological: Positive for dizziness and speech change.    Past Medical History:  Diagnosis Date  . Diabetes mellitus, type 2 (HCC)   . Hypertension     History reviewed. No pertinent surgical history.   reports that he has been smoking cigarettes. He has been smoking about 1.00 pack per day. He has never used smokeless tobacco. He reports previous alcohol use. He reports previous drug use.  Allergies  Allergen Reactions  . Penicillins     Family History  Problem Relation Age of Onset  . Heart attack Neg Hx      Prior to Admission medications   Medication Sig Start Date End Date Taking? Authorizing Provider  empagliflozin (JARDIANCE) 10 MG TABS tablet Take 10 mg by mouth daily.   Yes [provider]  hydrALAZINE (APRESOLINE) 50 MG tablet Take 50 mg by mouth in the morning and at bedtime.   Yes [provider]  metFORMIN (GLUCOPHAGE) 1000 MG tablet Take 1,000 mg by mouth in the morning and at bedtime.    [provider]    Physical Exam: Vitals:   09/29/20 1917 09/29/20 1959 09/29/20 2102 09/29/20 2315  BP: (!) 184/89 (!) 183/97 (!) 178/107 (!) 184/103  Pulse: 73 73 75 77  Resp: 16 20 17 17   Temp:   98 F (36.7 C) 98 F (36.7 C)  TempSrc:      SpO2: 98% 99% 100% 95%  Weight:      Height:        Constitutional: Acute alert and oriented x3, no  associated distress.   Skin: no rashes, no lesions, good skin turgor noted. Eyes: Pupils are equally reactive to light.  Notable lateral deviation of the left eye.  no evidence of scleral icterus or conjunctival pallor.  ENMT: Moist mucous membranes noted.  Posterior pharynx clear of any exudate or lesions.   Neck: normal, supple, no masses, no thyromegaly.  No evidence of jugular venous distension.   Respiratory: clear to auscultation bilaterally, no wheezing, no crackles. Normal respiratory effort. No accessory muscle use.  Cardiovascular: Regular rate and rhythm, no murmurs / rubs /  gallops. No extremity edema. 2+ pedal pulses. No carotid bruits.  Chest:   Nontender without crepitus or deformity.   Back:   Nontender without crepitus or deformity. Abdomen: Abdomen is soft and nontender.  No evidence of intra-abdominal masses.  Positive bowel sounds noted in all quadrants.   Musculoskeletal: No joint deformity upper and lower extremities. Good ROM, no contractures. Normal muscle tone.  Neurologic: Notable lateral deviation of the left eye consistent with amblyopia that has been present since since childhood.  Sensation intact.  Notable mild weakness of the left upper and left lower extremity left lower upper and left lower extremity.   Patient is responsive to verbal stimuli.   Psychiatric: Patient exhibits depressed mood with flat affect.  Patient does not seem to possess insight as to his current situation  Labs on Admission: I have personally reviewed following labs and imaging studies -   CBC: Recent Labs  Lab 09/29/20 1435  WBC 6.6  HGB 13.3  HCT 44.3  MCV 89.3  PLT 191   Basic Metabolic Panel: Recent Labs  Lab 09/29/20 1435  NA 146*  K 4.6  CL 115*  CO2 21*  GLUCOSE 108*  BUN 29*  CREATININE 1.72*  CALCIUM 7.9*   GFR: Estimated Creatinine Clearance: 51.7 mL/min (A) (by C-G formula based on SCr of 1.72 mg/dL (H)). Liver Function Tests: Recent Labs  Lab 09/29/20 1435  AST 28  ALT 38  ALKPHOS 127*  BILITOT 0.4  PROT 6.9  ALBUMIN 2.5*   No results for input(s): LIPASE, AMYLASE in the last 168 hours. No results for input(s): AMMONIA in the last 168 hours. Coagulation Profile: No results for input(s): INR, PROTIME in the last 168 hours. Cardiac Enzymes: No results for input(s): CKTOTAL, CKMB, CKMBINDEX, TROPONINI in the last 168 hours. BNP (last 3 results) No results for input(s): PROBNP in the last 8760 hours. HbA1C: No results for input(s): HGBA1C in the last 72 hours. CBG: No results for input(s): GLUCAP in the last 168 hours. Lipid  Profile: No results for input(s): CHOL, HDL, LDLCALC, TRIG, CHOLHDL, LDLDIRECT in the last 72 hours. Thyroid Function Tests: No results for input(s): TSH, T4TOTAL, FREET4, T3FREE, THYROIDAB in the last 72 hours. Anemia Panel: No results for input(s): VITAMINB12, FOLATE, FERRITIN, TIBC, IRON, RETICCTPCT in the last 72 hours. Urine analysis:    Component Value Date/Time   COLORURINE YELLOW (A) 09/29/2020 1924   APPEARANCEUR CLOUDY (A) 09/29/2020 1924   LABSPEC 1.013 09/29/2020 1924   PHURINE 5.0 09/29/2020 1924   GLUCOSEU 50 (A) 09/29/2020 1924   HGBUR SMALL (A) 09/29/2020 1924   BILIRUBINUR NEGATIVE 09/29/2020 1924   KETONESUR NEGATIVE 09/29/2020 1924   PROTEINUR >=300 (A) 09/29/2020 1924   NITRITE NEGATIVE 09/29/2020 1924   LEUKOCYTESUR SMALL (A) 09/29/2020 1924    Radiological Exams on Admission - Personally Reviewed: CT Head Wo Contrast  Result Date: 09/29/2020 CLINICAL DATA:  Bilateral lower extremity weakness.  Hypertension. EXAM: CT HEAD WITHOUT CONTRAST TECHNIQUE: Contiguous axial images were obtained from the base of the skull through the vertex without intravenous contrast. COMPARISON:  None. FINDINGS: Brain: No evidence of acute infarction, hemorrhage, hydrocephalus, extra-axial collection or mass lesion/mass effect. Chronic microvascular ischemic change noted. Vascular: No hyperdense vessel or unexpected calcification. Skull: Intact.  No focal lesion. Sinuses/Orbits: Negative. Other: None. IMPRESSION: No acute abnormality. Chronic microvascular ischemic change. Electronically Signed   By: Drusilla Kanner M.D.   On: 09/29/2020 15:09   CT Chest W Contrast  Result Date: 09/29/2020 CLINICAL DATA:  Tobacco abuse, hypertension, abnormal chest x-ray EXAM: CT CHEST WITH CONTRAST TECHNIQUE: Multidetector CT imaging of the chest was performed during intravenous contrast administration. CONTRAST:  16mL OMNIPAQUE IOHEXOL 300 MG/ML  SOLN COMPARISON:  09/29/2020 FINDINGS: Cardiovascular: The  heart is unremarkable without pericardial effusion. No evidence of thoracic aortic aneurysm or dissection. There are no filling defects or pulmonary emboli. Mediastinum/Nodes: No enlarged mediastinal, hilar, or axillary lymph nodes. Thyroid gland, trachea, and esophagus demonstrate no significant findings. Lungs/Pleura: There are bilateral pleural effusions, right greater than left. On the right, pleural fluid estimated at less than 2 L in on the left less than 1 L. there is dense consolidation within the right middle and right lower lobes compatible with atelectasis. Mucoid material is seen within the right mainstem bronchus and bronchus intermedius. Scattered throughout the remainder of the aerated lungs, most pronounced in the right upper lobe, there are ground-glass areas of consolidation most consistent with infection or inflammation. Bilateral bronchial wall thickening, again most pronounced within the right upper lobe. No pneumothorax. Upper Abdomen: No acute abnormality. Musculoskeletal: There are no acute or destructive bony lesions. Reconstructed images demonstrate no additional findings. IMPRESSION: 1. Bilateral pleural effusions, right greater than left. 2. Scattered bilateral ground-glass airspace disease greatest in the right upper lobe, with diffuse bronchial wall thickening. Overall, findings are most consistent with bronchopneumonia. 3. Dense right middle and right lower lobe consolidation, consistent with atelectasis. 4. Mucoid material within the right mainstem bronchus and bronchus intermedius. This could reflect retained secretions versus aspiration. Electronically Signed   By: Sharlet Salina M.D.   On: 09/29/2020 16:06   MR BRAIN WO CONTRAST  Result Date: 09/29/2020 CLINICAL DATA:  Neuro deficit, acute, stroke suspected. EXAM: MRI HEAD WITHOUT CONTRAST TECHNIQUE: Multiplanar, multiecho pulse sequences of the brain and surrounding structures were obtained without intravenous contrast.  COMPARISON:  Head CT September 29, 2020 FINDINGS: Brain: Focus of restricted diffusion in the anterior right cingulate gyrus. Remote infarcts in the bilateral cerebellar hemisphere, right side of the pons, left thalamus and bilateral centrum semiovale. Scattered foci of T2 hyperintensity are seen within the white matter of cerebral hemispheres and within the pons, nonspecific, most likely related to chronic microangiopathic changes. Mild parenchymal volume loss. No hemorrhage, hydrocephalus, extra-axial collection or mass lesion. Vascular: Normal flow voids. Skull and upper cervical spine: Normal marrow signal. Sinuses/Orbits: Divergent gaze.  Paranasal sinuses are clear. IMPRESSION: 1. Focus of restricted diffusion in the anterior right cingulate gyrus, consistent with acute/subacute infarct. 2. Remote infarcts in the bilateral cerebellar hemisphere, right side of the pons, left thalamus and bilateral centrum semiovale. 3. Moderate chronic microangiopathic changes, more pronounced than expected for age. These results were called by telephone at the time of interpretation on 09/29/2020 at 4:55 pm to provider [facility] , who verbally acknowledged these results. Electronically Signed   By: Baldemar Lenis M.D.   On: 09/29/2020 16:55   DG Chest  Portable 1 View  Result Date: 09/29/2020 CLINICAL DATA:  Rhonchi, smoker, hypertension EXAM: PORTABLE CHEST 1 VIEW COMPARISON:  None. FINDINGS: Normal heart size. Normal mediastinal contour. No pneumothorax. Small to moderate right pleural effusion. No left pleural effusion. Patchy right perihilar and lung base opacity. IMPRESSION: Small to moderate right pleural effusion. Patchy right perihilar and lung base opacity. Findings could represent pneumonia with parapneumonic effusion in the correct clinical setting. Suggest chest radiograph follow-up to resolution. If clinical presentation is not suggestive of pneumonia, consider chest CT with IV contrast for  further characterization. Electronically Signed   By: Delbert Phenix M.D.   On: 09/29/2020 14:57    EKG: Personally reviewed.  Rhythm is normal sinus rhythm with heart rate of 84 bpm.  No dynamic ST segment changes appreciated.  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   MRI brain reveals evidence of restricted diffusion of the anterior right cingulate gyrus  Additionally, MRI brain reveals evidence of remote infarcts of the bilateral cerebral hemispheres, left thalamus and bilateral centrum semiovale concerning for a cardioembolic source  Obtaining echocardiogram with bubble study in the morning  If echocardiogram and telemetry are unrevealing, patient may require a TEE and outpatient event monitor  325 mg of aspirin administered in the emergency department, followed by 81 mg of aspirin daily beginning tomorrow  Lipid panel ordered for the morning with initiation of high intensity statin if LDL above 70  Permissive hypertension for now, will provide as needed intravenous antihypertensives for systolic blood pressures above 220  Monitoring on telemetry  Obtaining formal neurology consultation in the morning  PT, SLP, OT evaluations ordered  Active Problems:   Hypertensive urgency   Patient presenting with hypertensive urgency, partially exacerbated by acute/subacute stroke in the setting of longstanding medication noncompliance  Obtaining formal neurology consultation tomorrow.  We will begin to initiate and titrate antihypertensives once neurology gives clearance to end permissive hypertension.    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  Antibiotics can be quickly discontinued if necessary.    AKI (acute kidney injury) (HCC)  Creatinine of 1.72  Unclear whether this is CKD or acute kidney injury  Possibly secondary to longstanding uncontrolled hypertension  Urinalysis is also suggestive of UTI, will initiate antibiotics for now until we better discern the etiology of the renal injury.  Antibiotics can be quickly de-escalate if necessary.  Obtaining renal ultrasound, urine electrolytes  Strict input and output monitoring  Monitoring renal function and electrolytes with serial chemistries    Type 2 diabetes mellitus  without long-term current use of insulin (HCC)   Accu-Cheks before every meal and nightly with sliding scale insulin  Hemoglobin A1c pending    Pleural effusion, bilateral   Please see assessment and plan above    Elevated troponin level not due myocardial infarction   Slight elevation in troponin in the absence of chest pain or dynamic ST segment change  Likely secondary to uncontrolled blood pressure in the setting of renal disease  Cycling cardiac enzymes  Monitoring patient on telemetry    Nicotine dependence, cigarettes, uncomplicated  Patient declining nicotine replacement therapy  Counseling patient on cessation daily    Code Status:  Full code Family  Communication: Significant other at bedside who has been updated on plan of care  Status is: Observation  The patient remains OBS appropriate and will d/c before 2 midnights.  Dispo: The patient is from: Home              Anticipated d/c is to: Home              Anticipated d/c date is: 2 days              Patient currently is not medically stable to d/c.   Difficult to place patient No        Marinda ElkGeorge J Kimoni Pagliarulo MD [facility] Pager [phone]  If 7PM-7AM, please contact night-coverage [email or web address] Use universal [place] password for that web site. If you do not have the password, please call the hospital operator.  09/30/2020, 1:53 AM

## 2020-09-30 NOTE — Progress Notes (Signed)
Rehab Admissions Coordinator Note:  Patient was screened by Clois Dupes for appropriateness for an Inpatient Acute Rehab consult per therapy recs. .  At this time, we are recommending Inpatient Rehab consult. I will place order for consult per protocol.  Clois Dupes RN MSN 09/30/2020, 12:34 PM  I can be reached at [phone].

## 2020-09-30 NOTE — Evaluation (Signed)
Physical Therapy Evaluation Patient Details Name: Tran Arzuaga MRN: [id number] DOB: 20-Apr-1966 Today's Date: 09/30/2020   History of Present Illness  presented to ER secondary to 2-3 week history of unsteady gait, slurred speech and multiple falls; admitted for TIA/CVA work up.  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  Clinical Impression  Patient resting in bed, talking on personal cell phone upon arrival to session.  Alert and oriented to self, location and follows simple commands; limited awareness of date, overall deficits and safety needs; limited ability to retain and integrate new information during session.  Strength and ROM assessment reveals mild weakness L > R hemi-body; moderate ataxia throughout all extremities and trunk.  Currently requires min/mod assist for sit/stand, standing balance and gait (30') without assist device.  Demonstrates broad BOS, staggered stepping pattern with inconsistent foot placement (due to ataxia); excessive lean/weight shift to L LE; mild/mod abdominal flaring with limited trunk stability.  Poor balance reactions; unsafe to attempt without +1 at all times.  Will plan to trial RW next session Would benefit from skilled PT to address above deficits and promote optimal return to PLOF.; recommend transition to acute inpatient rehab upon discharge for high-intensity, post-acute rehab services.      Follow Up Recommendations CIR    Equipment Recommendations       Recommendations for Other Services       Precautions / Restrictions Precautions Precautions: Fall Restrictions Weight Bearing Restrictions: No      Mobility  Bed Mobility Overal bed mobility: Modified Independent                  Transfers Overall transfer level: Needs assistance Equipment used: None Transfers: Sit to/from Stand Sit to Stand: Min assist;Mod assist          General transfer comment: broad BOS, very unsteady-reaching for bedrail/walls for external stabilization.  Excessive lean/weight shift to L LE  Ambulation/Gait Ambulation/Gait assistance: Min assist;Mod assist Gait Distance (Feet): 30 Feet Assistive device: None       General Gait Details: broad BOS, staggered stepping pattern with inconsistent foot placement (due to ataxia); excessive lean/weight shift to L LE; mild/mod abdominal flaring with limited trunk stability.  Poor balance reactions; unsafe to attempt without +1 at all times.  Will plan to trial RW next session  Stairs            Wheelchair Mobility    Modified Rankin (Stroke Patients Only)       Balance Overall balance assessment: Needs assistance Sitting-balance support: No upper extremity supported;Feet supported Sitting balance-Leahy Scale: Good     Standing balance support: Bilateral upper extremity supported Standing balance-Leahy Scale: Poor                               Pertinent Vitals/Pain Pain Assessment: No/denies pain    Home Living Family/patient expects to be discharged to:: Private residence Living Arrangements: Non-relatives/Friends Available Help at Discharge: Available PRN/intermittently;Friend(s) Type of Home: House Home Access: Stairs to enter   [employer] of Steps: 1 Home Layout: One level Home Equipment: None      Prior Function Level of Independence: Independent         Comments: Indep with ADLs, household and community mobilization; states he does drive and completes own grocery shopping, community errands; denies fall history (though chart suggests multiple falls recently).  Questionable  historian; will verify with family as available.     Hand Dominance        Extremity/Trunk Assessment   Upper Extremity Assessment Upper Extremity Assessment: Overall WFL for tasks assessed (grossly 4/5 throughout L LE, grossly 4+/5; moderate  dysmetria with finger/nose. does endorse some sensory deficit in fifth digit/lateral hand bilat)    Lower Extremity Assessment Lower Extremity Assessment: Overall WFL for tasks assessed (grossly 4/5 L LE, 4+/5 R LE; moderate ataxia with gait; endorses sensory deficit mid-calf distally bilat.  Significant scaling of skin knees distally (baseline per patient))       Communication   Communication: No difficulties  Cognition Arousal/Alertness: Awake/alert Behavior During Therapy: WFL for tasks assessed/performed Overall Cognitive Status: No family/caregiver present to determine baseline cognitive functioning                                 General Comments: oriented to self, location as hospital; aware of year, but unaware of date and time.  Follows simple commands, but poor recall of new information, poor insight and awareness of deficits      General Comments      Exercises Other Exercises Other Exercises: Educated in role of PT and progressive mobility; initiated education on transfer mechanics and safety with transfers/gait (need for assist with all mobility).  Introduced Fish farm manager of RW with mobiltiy tasks; patient agreeable to trialing next session.   Assessment/Plan    PT Assessment Patient needs continued PT services  PT Problem List Decreased strength;Decreased activity tolerance;Decreased balance;Decreased mobility;Decreased coordination;Decreased cognition;Decreased knowledge of use of DME;Decreased safety awareness;Decreased knowledge of precautions;Cardiopulmonary status limiting activity;Impaired sensation;Decreased skin integrity       PT Treatment Interventions DME instruction;Gait training;Stair training;Functional mobility training;Therapeutic activities;Therapeutic exercise;Balance training;Neuromuscular re-education;Cognitive remediation;Patient/family education    PT Goals (Current goals can be found in the Care Plan section)  Acute Rehab PT  Goals Patient Stated Goal: to get my legs working again PT Goal Formulation: With patient Time For Goal Achievement: 10/14/20 Potential to Achieve Goals: Good    Frequency 7X/week   Barriers to discharge        Co-evaluation               AM-PAC PT "6 Clicks" Mobility  Outcome Measure Help needed turning from your back to your side while in a flat bed without using bedrails?: None Help needed moving from lying on your back to sitting on the side of a flat bed without using bedrails?: None Help needed moving to and from a bed to a chair (including a wheelchair)?: A Lot Help needed standing up from a chair using your arms (e.g., wheelchair or bedside chair)?: A Lot Help needed to walk in hospital room?: A Lot Help needed climbing 3-5 steps with a railing? : A Lot 6 Click Score: 16    End of Session Equipment Utilized During Treatment: Gait belt Activity Tolerance: Patient tolerated treatment well Patient left: in bed;with call bell/phone within reach;with bed alarm set (ECHO entering room for testing) Nurse Communication: Mobility status PT Visit Diagnosis: Muscle weakness (generalized) (M62.81);Difficulty in walking, not elsewhere classified (R26.2);Hemiplegia and hemiparesis;Other symptoms and signs involving the nervous system (R29.898) Hemiplegia - Right/Left: Left Hemiplegia - dominant/non-dominant: Non-dominant Hemiplegia - caused by: Cerebral infarction    Time: 3159-4585 PT Time Calculation (min) (ACUTE ONLY): 23 min   Charges:   PT Evaluation $PT Eval Moderate Complexity: 1 Mod PT Treatments $Therapeutic Activity: 8-22  mins        Eean Buss H. Manson Passey, PT, DPT, NCS 09/30/20, 11:54 AM [phone]

## 2020-09-30 NOTE — Progress Notes (Addendum)
  PROGRESS NOTE    Tommy Perez  HTD:[id number] DOB: 05-Dec-1965 DOA: 09/29/2020  PCP: Patient, No Pcp Per    LOS - 0    Patient admitted earlier this AM with acute/subacte stroke confirmed on MRI after history of 2-3 weeks ataxic gait and slurred speech.     Interval subjective: pt feels well.  Denies any new or worsening neurologic symptoms.  Asks about discharge.  Echo tech at bedside finishing study.   Exam: pt awake & alert, NAD.  Abdomen soft and non-tender.  Lungs clear with diminished bases.  Heart sounds RRR.  No peripheral edema.  Lateral gaze bilaterally, dysarthric speech consistent with CN deficits. Bilateral distal lower extremities with what appeared to be venous stasis skin changes with thickening and flaking.   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 Leafy Half in detail, and I agree with the assessment and plan as outlined therein. In addition: --change to inpatient status --CIR consult per PT recommendation and screening by CIR admission coordinator --WOC consult for legs --F/u neuro recommendations but for now continue permissive HTN  No Charge    Pennie Banter, DO [facility]   If 7PM-7AM, please contact night-coverage [email or web address] 09/30/2020, 1:57 PM

## 2020-10-01 ENCOUNTER — Inpatient Hospital Stay: Payer: 59

## 2020-10-01 DIAGNOSIS — I160 Hypertensive urgency: Secondary | ICD-10-CM | POA: Diagnosis not present

## 2020-10-01 DIAGNOSIS — J90 Pleural effusion, not elsewhere classified: Secondary | ICD-10-CM | POA: Diagnosis not present

## 2020-10-01 DIAGNOSIS — N179 Acute kidney failure, unspecified: Secondary | ICD-10-CM

## 2020-10-01 LAB — BASIC METABOLIC PANEL
Anion gap: 10 (ref 5–15)
BUN: 35 mg/dL — ABNORMAL HIGH (ref 6–20)
CO2: 26 mmol/L (ref 22–32)
Calcium: 8 mg/dL — ABNORMAL LOW (ref 8.9–10.3)
Chloride: 111 mmol/L (ref 98–111)
Creatinine, Ser: 2.17 mg/dL — ABNORMAL HIGH (ref 0.61–1.24)
GFR, Estimated: 35 mL/min — ABNORMAL LOW (ref >60)
Glucose, Bld: 78 mg/dL (ref 70–99)
Potassium: 4.6 mmol/L (ref 3.5–5.1)
Sodium: 147 mmol/L — ABNORMAL HIGH (ref 135–145)

## 2020-10-01 LAB — MAGNESIUM: Magnesium: 2.2 mg/dL (ref 1.7–2.4)

## 2020-10-01 LAB — ECHOCARDIOGRAM COMPLETE BUBBLE STUDY
AR max vel: 2.48 cm2
AV Area VTI: 2.6 cm2
AV Area mean vel: 2.3 cm2
AV Mean grad: 2 mmHg
AV Peak grad: 2.9 mmHg
Ao pk vel: 0.85 m/s
Area-P 1/2: 4.6 cm2
MV VTI: 1.85 cm2
S' Lateral: 2.9 cm

## 2020-10-01 LAB — GLUCOSE, CAPILLARY
Glucose-Capillary: 75 mg/dL (ref 70–99)
Glucose-Capillary: 126 mg/dL — ABNORMAL HIGH (ref 70–99)
Glucose-Capillary: 112 mg/dL — ABNORMAL HIGH (ref 70–99)
Glucose-Capillary: 120 mg/dL — ABNORMAL HIGH (ref 70–99)

## 2020-10-01 LAB — VITAMIN B12: Vitamin B-12: 574 pg/mL (ref 180–914)

## 2020-10-01 LAB — URINE CULTURE: Culture: <10000 — AB

## 2020-10-01 LAB — UREA NITROGEN, URINE: Urea Nitrogen, Ur: 277 mg/dL

## 2020-10-01 LAB — TSH: TSH: 5.877 u[IU]/mL — ABNORMAL HIGH (ref 0.350–4.500)

## 2020-10-01 LAB — T4, FREE: Free T4: 0.87 ng/dL (ref 0.61–1.12)

## 2020-10-01 MED ORDER — DEXTROSE-NACL 5-0.45 % IV SOLN: INTRAVENOUS | Status: DC

## 2020-10-01 MED ORDER — ATORVASTATIN CALCIUM 20 MG PO TABS
40.0000 mg | ORAL_TABLET | Freq: Every day | ORAL | Status: DC
  Administered 2020-10-01 – 2020-10-05 (×5): 40 mg via ORAL
  Filled 2020-10-01 (×5): qty 2

## 2020-10-01 MED ORDER — HYDRALAZINE HCL 50 MG PO TABS: 50.0000 mg | ORAL_TABLET | Freq: Three times a day (TID) | ORAL | Status: DC

## 2020-10-01 MED ORDER — HYDRALAZINE HCL 50 MG PO TABS
50.0000 mg | ORAL_TABLET | Freq: Two times a day (BID) | ORAL | Status: DC
  Administered 2020-10-01 (×2): 50 mg via ORAL
  Filled 2020-10-01 (×2): qty 1

## 2020-10-01 MED ORDER — NICOTINE 14 MG/24HR TD PT24
14.0000 mg | MEDICATED_PATCH | Freq: Every day | TRANSDERMAL | Status: DC
  Administered 2020-10-01 – 2020-10-05 (×5): 14 mg via TRANSDERMAL
  Filled 2020-10-01 (×6): qty 1

## 2020-10-01 NOTE — Plan of Care (Signed)
  Problem: Education: Goal: Knowledge of disease or condition will improve 10/01/2020 1543 by Ansel Bong, RN Outcome: Progressing 10/01/2020 1543 by Ansel Bong, RN Outcome: Progressing Goal: Knowledge of secondary prevention will improve 10/01/2020 1543 by Ansel Bong, RN Outcome: Progressing 10/01/2020 1543 by Ansel Bong, RN Outcome: Progressing Goal: Knowledge of patient specific risk factors addressed and post discharge goals established will improve 10/01/2020 1543 by Ansel Bong, RN Outcome: Progressing 10/01/2020 1543 by Ansel Bong, RN Outcome: Progressing   Problem: Self-Care: Goal: Ability to participate in self-care as condition permits will improve 10/01/2020 1543 by Ansel Bong, RN Outcome: Progressing 10/01/2020 1543 by Ansel Bong, RN Outcome: Progressing

## 2020-10-01 NOTE — Progress Notes (Signed)
Objective Swallowing Evaluation: Type of Study: MBS-Modified Barium Swallow Study   Patient Details  Name: Tommy Perez MRN: [id number] Date of Birth: Apr 26, 1966  Today's Date: 10/01/2020 Time: SLP Start Time (ACUTE ONLY): 2035 -SLP Stop Time (ACUTE ONLY): 0910  SLP Time Calculation (min) (ACUTE ONLY): 35 min   Past Medical History:  Past Medical History:  Diagnosis Date   Diabetes mellitus, type 2 (HCC)    Hypertension    Past Surgical History: History reviewed. No pertinent surgical history. HPI: 55 y.o. male who presented to ED with 2-3 week history of unsteady gait, slurred speech and multiple falls. MRI showed acute/subacute R cingulate gyrus infarct, remote bilateral cerebellar, R pons, L thalamus and bilateral centrum semiovale infarcts. Chest CT 09/29/20 showed bilateral pleural effusions, R>L, scattered bilateral ground-glass airspace disease greatest in RUL, dense R middle and R lower lobe consolidation, mucoid material within R mainstem brochus and bronchus intermedius.   Subjective: asks why he is having this test    Assessment / Plan / Recommendation  CHL IP CLINICAL IMPRESSIONS 10/01/2020  Clinical Impression Patient presents with mild oropharyngeal dysphagia. Oral stage characterized by lingual weakness with decreased bolus cohesion and premature spillage to the valleculae with thin, puree and solids, impaired mastication (edentulous), and mild oral residue. Piecemeal swallowing with larger boluses of thin. Discoordination with mixed consistency (pill/thin liquid). Swallow initiation is delayed to pyriform sinuses with thin liquids, when material spills into pt's laryngeal vestibule during the swallow, with consistent, deep laryngeal penetration, unsensed by the patient. Hard cough vs throat clear required to eject penetration. Timing improves with thicker boluses (nectar, solids). Aside from delayed swallow initiation resulting in penetration with thin liquids, pharyngeal  stage appears functional with adequate stripping of bolus from the pharynx. Epiglottic deflection is complete. There is no observed frank aspiration, however potential for trace aspiration with thin due with to consistent larygneal penetration. Amplitude/duration of cricopharyngeal opening appears within functional limits. Given cognitive impairments, anticipate pt will have difficulty implementing compensations consistently.  Recommend dysphagia 3, nectar thick liquids with meds whole in puree, with potential for advancement as pulmonary status improves and with training in aspiration precautions (throat clear, small sips for thins).  SLP Visit Diagnosis Dysphagia, oropharyngeal phase (R13.12)  Attention and concentration deficit following --  Frontal lobe and executive function deficit following --  Impact on safety and function Mild aspiration risk      CHL IP TREATMENT RECOMMENDATION 10/01/2020  Treatment Recommendations Therapy as outlined in treatment plan below     Prognosis 10/01/2020  Prognosis for Safe Diet Advancement Good  Barriers to Reach Goals Cognitive deficits  Barriers/Prognosis Comment --    CHL IP DIET RECOMMENDATION 10/01/2020  SLP Diet Recommendations Nectar thick liquid;Dysphagia 3 (Mech soft) solids  Liquid Administration via Cup;Straw  Medication Administration Whole meds with puree  Compensations Minimize environmental distractions;Slow rate;Small sips/bites  Postural Changes Seated upright at 90 degrees      CHL IP OTHER RECOMMENDATIONS 10/01/2020  Recommended Consults --  Oral Care Recommendations Oral care BID  Other Recommendations Order thickener from pharmacy;Remove water pitcher;Prohibited food (jello, ice cream, thin soups)      CHL IP FOLLOW UP RECOMMENDATIONS 10/01/2020  Follow up Recommendations Inpatient Rehab      CHL IP FREQUENCY AND DURATION 10/01/2020  Speech Therapy Frequency (ACUTE ONLY) min 3x week  Treatment Duration 2 weeks            CHL IP ORAL PHASE 10/01/2020  Oral Phase Impaired  Oral - Pudding Teaspoon --  Oral - Pudding Cup --  Oral - Honey Teaspoon --  Oral - Honey Cup --  Oral - Nectar Teaspoon --  Oral - Nectar Cup WFL  Oral - Nectar Straw WFL  Oral - Thin Teaspoon --  Oral - Thin Cup Lingual/palatal residue;Premature spillage;Piecemeal swallowing;Decreased bolus cohesion  Oral - Thin Straw Lingual/palatal residue;Piecemeal swallowing;Decreased bolus cohesion;Premature spillage  Oral - Puree Decreased bolus cohesion;Premature spillage  Oral - Mech Soft Impaired mastication;Weak lingual manipulation;Decreased bolus cohesion;Premature spillage  Oral - Regular --  Oral - Multi-Consistency --  Oral - Pill --  Oral Phase - Comment --    CHL IP PHARYNGEAL PHASE 10/01/2020  Pharyngeal Phase Impaired  Pharyngeal- Pudding Teaspoon --  Pharyngeal --  Pharyngeal- Pudding Cup --  Pharyngeal --  Pharyngeal- Honey Teaspoon --  Pharyngeal --  Pharyngeal- Honey Cup --  Pharyngeal --  Pharyngeal- Nectar Teaspoon --  Pharyngeal --  Pharyngeal- Nectar Cup [facility]  Pharyngeal Material does not enter airway  Pharyngeal- Nectar Straw WFL  Pharyngeal Material does not enter airway  Pharyngeal- Thin Teaspoon --  Pharyngeal --  Pharyngeal- Thin Cup Delayed swallow initiation-pyriform sinuses;Penetration/Aspiration during swallow;Compensatory strategies attempted (with notebox)  Pharyngeal Material enters airway, remains ABOVE vocal cords and not ejected out  Pharyngeal- Thin Straw Delayed swallow initiation-pyriform sinuses;Penetration/Aspiration during swallow  Pharyngeal Material enters airway, remains ABOVE vocal cords and not ejected out  Pharyngeal- Puree Delayed swallow initiation-vallecula  Pharyngeal Material does not enter airway  Pharyngeal- Mechanical Soft Delayed swallow initiation-vallecula  Pharyngeal Material enters airway, remains ABOVE vocal cords then ejected out;Material does not enter airway   Pharyngeal- Regular --  Pharyngeal --  Pharyngeal- Multi-consistency --  Pharyngeal --  Pharyngeal- Pill --  Pharyngeal --  Pharyngeal Comment --     CHL IP CERVICAL ESOPHAGEAL PHASE 10/01/2020  Cervical Esophageal Phase WFL  Pudding Teaspoon --  Pudding Cup --  Honey Teaspoon --  Honey Cup --  Nectar Teaspoon --  Nectar Cup --  Nectar Straw --  Thin Teaspoon --  Thin Cup --  Thin Straw --  Puree --  Mechanical Soft --  Regular --  Multi-consistency --  Pill --  Cervical Esophageal Comment --    Rondel Baton, MS, CCC-SLP Speech-Language Pathologist  Arlana Lindau 10/01/2020, 9:47 AM

## 2020-10-01 NOTE — Progress Notes (Signed)
Physical Therapy Treatment Patient Details Name: Tommy Perez MRN: [id number] DOB: 1966-01-17 Today's Date: 10/01/2020    History of Present Illness presented to ER secondary to 2-3 week history of unsteady gait, slurred speech and multiple falls; admitted for TIA/CVA work up.  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    PT Comments    Pt was pleasant and motivated to participate during the session and put forth good effort throughout.  Pt required no physical assistance with bed mobility tasks and only min A for stability initially upon standing but static standing balance improved grossly as the session progressed.  Pt presented with significantly improved stability with ambulation with a RW compared to with no AD but did drift left/right mildly even with the walker.  Pt will benefit from PT services in an IR setting upon discharge to safely address deficits listed in patient problem list for decreased caregiver assistance and eventual return to PLOF.     Follow Up Recommendations  CIR     Equipment Recommendations  Other (comment) (TBD at next venue of care)    Recommendations for Other Services       Precautions / Restrictions Precautions Precautions: Fall Precaution Comments: Watch HR Restrictions Weight Bearing Restrictions: No    Mobility  Bed Mobility Overal bed mobility: Needs Assistance Bed Mobility: Supine to Sit;Sit to Supine     Supine to sit: Supervision Sit to supine: Supervision   General bed mobility comments: Extra time and effort but no physical assistance required    Transfers Overall transfer level: Needs assistance Equipment used: None;Rolling walker (2 wheeled) Transfers: Sit to/from Stand Sit to Stand: Min assist         General transfer comment: Min A for stability upon initial stand but then  CGA  Ambulation/Gait Ambulation/Gait assistance: Min assist;Min guard Gait Distance (Feet): 80 Feet x 2 Assistive device: None;Rolling walker (2 wheeled) Gait Pattern/deviations: Drifts right/left;Step-through pattern;Decreased step length - right;Decreased step length - left Gait velocity: decreased   General Gait Details: Min A for stability with amb without an AD but CGA with a RW with min drifting left/right but no LOB   Stairs             Wheelchair Mobility    Modified Rankin (Stroke Patients Only)       Balance Overall balance assessment: Needs assistance Sitting-balance support: No upper extremity supported;Feet supported Sitting balance-Leahy Scale: Fair     Standing balance support: Bilateral upper extremity supported;No upper extremity supported;During functional activity Standing balance-Leahy Scale: Poor Standing balance comment: Fair stability with a RW, poor stability without UE support                            Cognition Arousal/Alertness: Awake/alert Behavior During Therapy: WFL for tasks assessed/performed Overall Cognitive Status: Within Functional Limits for tasks assessed                                        Exercises Total Joint Exercises Ankle Circles/Pumps: Strengthening;Both;10 reps;15 reps (with manual resistance) Quad Sets: Strengthening;Both;10 reps;15 reps Gluteal Sets: Strengthening;Both;10 reps;15 reps Towel Squeeze: Strengthening;Both;5 reps;10 reps Heel Slides: Strengthening;Both;10 reps Hip ABduction/ADduction: Strengthening;Both;10 reps Straight Leg Raises: Strengthening;Both;10 reps Long Arc Quad: Strengthening;Both;10 reps;15 reps (with manual resistance) Knee Flexion: Strengthening;Both;10 reps;15  reps (with manual resistance) Marching in Standing: AROM;Strengthening;Both;10 reps;Standing Other Exercises Other Exercises: Dynamic sitting balance training and core therex with reaching outside  BOS and resisted perturbations Other Exercises: Static standing balance training with and without UE support    General Comments        Pertinent Vitals/Pain Pain Assessment: No/denies pain    Home Living                      Prior Function            PT Goals (current goals can now be found in the care plan section) Progress towards PT goals: Progressing toward goals    Frequency    7X/week      PT Plan Current plan remains appropriate    Co-evaluation              AM-PAC PT "6 Clicks" Mobility   Outcome Measure  Help needed turning from your back to your side while in a flat bed without using bedrails?: None Help needed moving from lying on your back to sitting on the side of a flat bed without using bedrails?: A Little Help needed moving to and from a bed to a chair (including a wheelchair)?: A Little Help needed standing up from a chair using your arms (e.g., wheelchair or bedside chair)?: A Little Help needed to walk in hospital room?: A Little Help needed climbing 3-5 steps with a railing? : A Lot 6 Click Score: 18    End of Session Equipment Utilized During Treatment: Gait belt Activity Tolerance: Patient tolerated treatment well Patient left: in bed;with call bell/phone within reach;with bed alarm set;Other (comment) (Pt declined OOB to chair) Nurse Communication: Mobility status PT Visit Diagnosis: Muscle weakness (generalized) (M62.81);Difficulty in walking, not elsewhere classified (R26.2);Hemiplegia and hemiparesis;Other symptoms and signs involving the nervous system (R29.898) Hemiplegia - Right/Left: Left Hemiplegia - dominant/non-dominant: Non-dominant Hemiplegia - caused by: Cerebral infarction     Time: 5366-4403 PT Time Calculation (min) (ACUTE ONLY): 40 min  Charges:  $Gait Training: 8-22 mins $Therapeutic Exercise: 8-22 mins $Therapeutic Activity: 8-22 mins                     D. Scott Delmer Kowalski PT, DPT 10/01/20, 3:46  PM

## 2020-10-01 NOTE — Progress Notes (Addendum)
PROGRESS NOTE    Tommy Perez   UEA:[id number]  DOB: Dec 23, 1965  PCP: Patient, No Pcp Per    DOA: 09/29/2020 LOS: 1   Brief Narrative   55 year old male with past medical history of hypertension, diabetes mellitus type 2, nicotine dependence and medication noncompliance who presented to the ED on 09/29/20 with complaints of unsteady gait with frequent falls and slurred speech.  Symptoms had started 2-3 weeks prior but patient did not seek any medical attention.  MRI brain showed an area of restricted diffusion in the right anterior angulate gyrus.      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  MRI as above with acute/subacute stroke in addition to multiple prior/remote infarcts in bilateral cerebral hemispheres, L thalamus, b/l centrum semiovale -- ? cardioembolic etiology of strokes. --Neurology following --Echo negative for shunt --Telemetry monitoring - ambulatory monitor at d/c if tele unremarkable --Discuss with cardiology re TEE  --Continue ASA 81 mg daily --Start high dose statin: Lipitor 40 mg daily --Follow up on recs from PT/OT/SLP --Going for MBSS today --Follow up neurology recommendations  Dysphagia / Dysarthria due to stroke - SLP following, as above  Hypertensive urgency - initially allowed for permissive HTN, okay to start slowly  treating at this point.  Is getting IV Lasix BID.  Resumed home hydralazine 50 mg BID.   Acute diastolic CHF / HFpEF - presented with elevated BNP 1264 and bilateral pleural effusions.  Mild LVH on Echo.  CHF most likely due to longstanding uncontrolled HTN.   Echo EF 55-60%, grade 1 diastolic dfx, mild LVH Net IO Since Admission: -1,805 mL [10/01/20 1920] --Stop IV Lasix with rise in Cr --Monitor I/O's, daily weights --Monitor renal function and electrolytes  Complicated UTI - POA.  UA with pyuria and leuk-esterase.   Started on empiric antibiotics on admission.  Continue abx and follow up on urine culture.  AKI superimposed on CKD stage IIIa - POA with Cr 1.72 >> 2.17 after diuresis.  Baseline unclear but long history uncontrolled HTN and diabetes makes CKD IIIa most likely estimated baseline. AKI suspected multifactorial due to reduced PO intake since stroke, UTI --Abx for UTI as above --Start gentle IV fluids given also hypernatremic   Hypernatremia - Na 147 this AM.  Given Cr also up, will start on D5-1/2NS fluids and recheck BMP tomorrow.  Due to poor PO intake due to above.  Type 2 diabetes mellitus - sliding scale Novolog.  Follow up A1c.   Pleural effusion, bilateral - suspect due to hypertensive / diastolic heart failure.  Started on IV lasix on admission, but need to stop due to worsened renal function.  Monitor respiratory status.  Other mgmt as above.  Elevated troponin due to demand ischemia - mildly elevated and flat trend, no chest pain or acute ischemic changes on EKG. Consistent with demand ischemia in setting of hypertensive urgency / CHF.  Nicotine dependence, cigarettes, uncomplicated - nicotine patch ordered per pt request.  Counseled on importance of cessation.    DVT prophylaxis: SCD's Start: 09/29/20 2226   Diet:  Diet Orders (From admission, onward)    Start     Ordered   10/01/20 0924  DIET DYS 3 Room service appropriate? Yes; Fluid consistency: Nectar  Thick  Diet effective now       Question Answer Comment  Room service appropriate? Yes   Fluid consistency: Nectar Thick      10/01/20 0924            Code Status: Full Code    Subjective 10/01/20    Pt reports feeling okay.  No new or worsened neurologic symptoms.  No acute events reported.  Going for modified barium swallow study.   Disposition Plan & Communication   Status is: Inpatient  Inpatient status remains appropriate due to ongoing diagnostic evaluation not appropriate for outpatient setting, lab abnormalities, uncontrolled BP, and pending CIR consult for dispo.  Dispo: The patient is from: Home              Anticipated d/c is to: CIR              Anticipated d/c date is: 2-3 days              Patient currently is not medically stable for d/c.   Difficult to place patient: No   Family Communication: none at bedside, will attempt to call   Consults, Procedures, Significant Events   Consultants:   Neurology  Procedures:   None  Antimicrobials:  Anti-infectives (From admission, onward)   Start     Dose/Rate Route Frequency Ordered Stop   09/29/20 2100  cefTRIAXone (ROCEPHIN) 1 g in sodium chloride 0.9 % 100 mL IVPB        1 g 200 mL/hr over 30 Minutes Intravenous Every 24 hours 09/29/20 2015 10/04/20 2059        Objective   Vitals:   10/01/20 0545 10/01/20 0733 10/01/20 1153 10/01/20 1523  BP: (!) 199/116 (!) 196/120 (!) 195/120 (!) 197/114  Pulse: 96 97 92 97  Resp: 17 18 18 20   Temp: 98 F (36.7 C) 98.2 F (36.8 C) 98.1 F (36.7 C) 97.9 F (36.6 C)  TempSrc: Oral     SpO2: 94% 98% 100% 99%  Weight:      Height:        Intake/Output Summary (Last 24 hours) at 10/01/2020 1906 Last data filed at 10/01/2020 1635 Gross per 24 hour  Intake 515 ml  Output 1750 ml  Net -1235 ml   Filed Weights   09/29/20 1410  Weight: 77.1 kg    Physical Exam:  General exam: awake, alert, no acute distress Respiratory system: symmetric chest rise,  normal respiratory effort. Cardiovascular system: RRR, no JVD, no pedal edema.   Central nervous system: dysarthric speech stable, b/l lateral gaze stable, no new gross focal deficits on brief exam Skin: dry, intact, normal temperature Psychiatry: normal mood, congruent affect, judgement and insight appear normal  Labs   Data Reviewed: I have personally reviewed following labs and imaging studies  CBC: Recent Labs  Lab 09/29/20 1435  WBC 6.6  HGB 13.3  HCT 44.3  MCV 89.3  PLT 191   Basic Metabolic Panel: Recent Labs  Lab 09/29/20 1435 10/01/20 0507  NA 146* 147*  K 4.6 4.6  CL 115* 111  CO2 21* 26  GLUCOSE 108* 78  BUN  29* 35*  CREATININE 1.72* 2.17*  CALCIUM 7.9* 8.0*  MG  --  2.2   GFR: Estimated Creatinine Clearance: 41 mL/min (A) (by C-G formula based on SCr of 2.17 mg/dL (H)). Liver Function Tests: Recent Labs  Lab 09/29/20 1435  AST 28  ALT 38  ALKPHOS 127*  BILITOT 0.4  PROT 6.9  ALBUMIN 2.5*   No results for input(s): LIPASE, AMYLASE in the last 168 hours. No results for input(s): AMMONIA in the last 168 hours. Coagulation Profile: No results for input(s): INR, PROTIME in the last 168 hours. Cardiac Enzymes: No results for input(s): CKTOTAL, CKMB, CKMBINDEX, TROPONINI in the last 168 hours. BNP (last 3 results) No results for input(s): PROBNP in the last 8760 hours. HbA1C: Recent Labs    09/30/20 0435  HGBA1C 8.4*   CBG: Recent Labs  Lab 09/30/20 1631 09/30/20 2041 10/01/20 0734 10/01/20 1153 10/01/20 1633  GLUCAP 86 101* 75 126* 112*   Lipid Profile: Recent Labs    09/30/20 0435  CHOL 173  HDL 66  LDLCALC 87  TRIG 99  CHOLHDL 2.6   Thyroid Function Tests: Recent Labs    10/01/20 0507 10/01/20 1105  TSH  --  5.877*  FREET4 0.87  --    Anemia Panel: Recent Labs    10/01/20 1105  VITAMINB12 574   Sepsis Labs: Recent Labs  Lab 09/29/20 1841  PROCALCITON <0.10    Recent Results (from the past 240 hour(s))  Resp  Panel by RT-PCR (Flu A&B, Covid) Nasopharyngeal Swab     Status: None   Collection Time: 09/29/20  5:00 PM   Specimen: Nasopharyngeal Swab; Nasopharyngeal(NP) swabs in vial transport medium  Result Value Ref Range Status   SARS Coronavirus 2 by RT PCR NEGATIVE NEGATIVE Final    Comment: (NOTE) SARS-CoV-2 target nucleic acids are NOT DETECTED.  The SARS-CoV-2 RNA is generally detectable in upper respiratory specimens during the acute phase of infection. The lowest concentration of SARS-CoV-2 viral copies this assay can detect is 138 copies/mL. A negative result does not preclude SARS-Cov-2 infection and should not be used as the sole basis for treatment or other patient management decisions. A negative result may occur with  improper specimen collection/handling, submission of specimen other than nasopharyngeal swab, presence of viral mutation(s) within the areas targeted by this assay, and inadequate number of viral copies(<138 copies/mL). A negative result must be combined with clinical observations, patient history, and epidemiological information. The expected result is Negative.  Fact Sheet for Patients:  BloggerCourse.com  Fact Sheet for Healthcare Providers:  SeriousBroker.it  This test is no t yet approved or cleared by the [place] FDA and  has been authorized for detection and/or diagnosis of SARS-CoV-2 by FDA under an Emergency Use Authorization (EUA). This EUA will remain  in effect (meaning this test can be used) for the duration of the COVID-19 declaration under Section 564(b)(1) of the Act, 21 U.S.C.section 360bbb-3(b)(1), unless the authorization is terminated  or revoked sooner.       Influenza A by PCR NEGATIVE NEGATIVE Final   Influenza B by PCR NEGATIVE NEGATIVE Final    Comment: (NOTE) The Xpert Xpress SARS-CoV-2/FLU/RSV plus assay is intended as an aid in the diagnosis of influenza from Nasopharyngeal  swab specimens and should not be used as a sole basis for treatment. Nasal washings and aspirates are unacceptable for Xpert Xpress SARS-CoV-2/FLU/RSV testing.  Fact Sheet for Patients: BloggerCourse.com  Fact Sheet for Healthcare Providers: SeriousBroker.it  This test is not  yet approved or cleared by the [place] and has been authorized for detection and/or diagnosis of SARS-CoV-2 by FDA under an Emergency Use Authorization (EUA). This EUA will remain in effect (meaning this test can be used) for the duration of the COVID-19 declaration under Section 564(b)(1) of the Act, 21 U.S.C. section 360bbb-3(b)(1), unless the authorization is terminated or revoked.  Performed at [facility], [address] [place], [place] [address]   Culture, blood (Routine X 2) w Reflex to ID Panel     Status: None (Preliminary result)   Collection Time: 09/29/20  6:41 PM   Specimen: BLOOD  Result Value Ref Range Status   Specimen Description BLOOD BLOOD RIGHT HAND  Final   Special Requests   Final    BOTTLES DRAWN AEROBIC AND ANAEROBIC Blood Culture results may not be optimal due to an inadequate volume of blood received in culture bottles   Culture   Final    NO GROWTH 2 DAYS Performed at [facility], [address], [place], [place] [address]    Report Status PENDING  Incomplete  Culture, blood (Routine X 2) w Reflex to ID Panel     Status: None (Preliminary result)   Collection Time: 09/29/20  6:53 PM   Specimen: BLOOD  Result Value Ref Range Status   Specimen Description BLOOD BLOOD RIGHT FOREARM  Final   Special Requests   Final    BOTTLES DRAWN AEROBIC AND ANAEROBIC Blood Culture adequate volume   Culture   Final    NO GROWTH 2 DAYS Performed at [facility], [address], [place], [place] [address]    Report Status PENDING  Incomplete  Urine Culture     Status: Abnormal   Collection Time:  09/30/20  1:18 AM   Specimen: Urine, Random  Result Value Ref Range Status   Specimen Description   Final    URINE, RANDOM Performed at [facility], [address] [place], [place] [address]    Special Requests   Final    NONE Performed at [facility], [address], [place], [place] [address]    Culture (A)  Final    <10,000 COLONIES/mL INSIGNIFICANT GROWTH Performed at [facility], [address], [place], [place] [address]    Report Status 10/01/2020 FINAL  Final      Imaging Studies   MR ANGIO HEAD WO CONTRAST  Result Date: 09/30/2020 CLINICAL DATA:  Stroke EXAM: MRA HEAD WITHOUT CONTRAST TECHNIQUE: Angiographic images of the Circle of Willis were obtained using MRA technique without intravenous contrast. COMPARISON:  None. FINDINGS: Both vertebral arteries patent to the basilar. Left vertebral artery dominant. PICA patent bilaterally. Basilar widely patent. Posterior cerebral arteries widely patent bilaterally. Internal carotid artery widely patent bilaterally with mild atherosclerotic irregularity on the left. Anterior and middle cerebral arteries patent bilaterally. Negative for large vessel occlusion.  Negative for aneurysm. IMPRESSION: Negative MRA head Electronically Signed   By: Marlan Palau M.D.   On: 09/30/2020 11:09   MR ANGIO NECK W WO CONTRAST  Result Date: 09/30/2020 CLINICAL DATA:  Stroke. EXAM: MRA NECK WITHOUT AND WITH CONTRAST TECHNIQUE: Multiplanar and multiecho pulse sequences of the neck were obtained without and with intravenous contrast. Angiographic images of the neck were obtained using MRA technique without and with intravenous contrast. CONTRAST:  76mL GADAVIST GADOBUTROL 1 MMOL/ML IV SOLN COMPARISON:  None. FINDINGS: Antegrade flow in the carotid and vertebral arteries bilaterally. Carotid bifurcation widely patent.  No significant carotid stenosis Left vertebral artery dominant and widely patent. Non dominant right vertebral  artery widely patent without stenosis. IMPRESSION: Negative MRA neck. Electronically Signed   By: Marlan Palau M.D.   On: 09/30/2020 11:11   MR CERVICAL SPINE WO CONTRAST  Result Date: 10/01/2020 CLINICAL DATA:  Initial evaluation for unsteady gait, slurred speech, myelopathy. EXAM: MRI CERVICAL, THORACIC AND LUMBAR SPINE WITHOUT CONTRAST TECHNIQUE: Multiplanar and multiecho pulse sequences of the cervical spine, to include the craniocervical junction and cervicothoracic junction, and thoracic and lumbar spine, were obtained without intravenous contrast. COMPARISON:  None available. FINDINGS: MRI CERVICAL SPINE FINDINGS Alignment: Examination severely degraded by motion artifact, and is nearly nondiagnostic. Straightening of the normal cervical lordosis.  No listhesis. Vertebrae: Vertebral body height maintained without visible acute or chronic fracture. Bone marrow signal intensity grossly within normal limits. No visible discrete or worrisome osseous lesions. No abnormal marrow edema. Cord: Signal intensity within the cervical spinal cord is grossly within normal limits. Grossly normal cord caliber and morphology. Posterior Fossa, vertebral arteries, paraspinal tissues: Visualized brain and posterior fossa within normal limits. Craniocervical junction normal. Paraspinous and prevertebral soft tissues grossly within normal limits. Vertebral artery flow voids grossly maintained. Disc levels: C2-C3: Unremarkable. C3-C4: Mild disc bulge. No definite spinal stenosis. Foramina are grossly patent. C4-C5: Mild disc bulge. No more than mild spinal stenosis. Foramina remain grossly patent. C5-C6: Mild diffuse disc bulge. No more than mild spinal stenosis. Foramina are grossly patent. C6-C7:  Grossly negative. C7-T1:  Grossly negative. MRI THORACIC SPINE FINDINGS Alignment: Examination severely degraded by motion artifact and nearly nondiagnostic. Alignment within normal limits with preservation of the normal thoracic  kyphosis. No listhesis. Vertebrae: Vertebral body height maintained without acute or chronic fracture. Bone marrow signal intensity grossly within normal limits. No visible discrete or worrisome osseous lesions or abnormal marrow edema. Cord: Signal intensity within the thoracic spinal cord is grossly within normal limits. Grossly normal cord caliber and morphology. Mild diffuse prominence of the dorsal epidural fat noted. Paraspinal and other soft tissues: Paraspinous soft tissues demonstrate no obvious abnormality. Large layering bilateral pleural effusions noted, right larger than left. Disc levels: T9-10: Suspected small central disc protrusion mildly indents the ventral thecal sac (series 21, image 32). Minimal flattening of the ventral cord without cord signal changes or significant spinal stenosis. No other appreciable or significant disc pathology seen within the thoracic spine. No stenosis or neural impingement. MRI LUMBAR SPINE FINDINGS Segmentation: Examination severely degraded by motion artifact and nearly nondiagnostic. Standard segmentation. Lowest well-formed disc space labeled the L5-S1 level. Alignment: Physiologic with preservation of the normal lumbar lordosis. No listhesis. Vertebrae: Vertebral body height maintained without acute or chronic fracture. Bone marrow signal intensity grossly within normal limits. No visible worrisome osseous lesions. No abnormal marrow edema. Conus medullaris and cauda equina: Conus extends to the L1 level. Conus and cauda equina appear grossly within normal limits. Paraspinal and other soft tissues: Paraspinous soft tissues demonstrate no obvious abnormality. Disc levels: L1-2:  Unremarkable. L2-3:  Unremarkable. L3-4: Minimal annular disc bulge. No spinal stenosis. Foramina appear grossly patent. L4-5: Minimal disc bulge. No significant spinal stenosis. Foramina remain grossly patent. L5-S1:  Unremarkable. IMPRESSION: MRI CERVICAL SPINE IMPRESSION: 1. Severely  limited exam due to extensive motion artifact. 2. Grossly normal MRI appearance of the cervical spinal cord. Mild noncompressive disc bulging at C3-4 through C5-6 with no more than mild spinal stenosis. MRI THORACIC SPINE IMPRESSION: 1. Severely limited exam due to extensive motion artifact. 2.  Grossly normal MRI appearance of the thoracic spinal cord. No definite cord signal abnormality to suggest edema or myelomalacia. No significant spinal stenosis or neural impingement. 3. Suspected small central disc protrusion at T9-10 without significant stenosis. 4. Large layering bilateral pleural effusions, right greater than left. MRI LUMBAR SPINE IMPRESSION: 1. Severely limited exam due to motion artifact and nearly nondiagnostic exam. 2. No acute abnormality within the lumbar spine. No significant spinal stenosis or evidence for neural impingement. 3. Minimal disc bulging at L3-4 and L4-5 without significant stenosis. Electronically Signed   By: Rise MuBenjamin  McClintock M.D.   On: 10/01/2020 16:19   MR THORACIC SPINE WO CONTRAST  Result Date: 10/01/2020 CLINICAL DATA:  Initial evaluation for unsteady gait, slurred speech, myelopathy. EXAM: MRI CERVICAL, THORACIC AND LUMBAR SPINE WITHOUT CONTRAST TECHNIQUE: Multiplanar and multiecho pulse sequences of the cervical spine, to include the craniocervical junction and cervicothoracic junction, and thoracic and lumbar spine, were obtained without intravenous contrast. COMPARISON:  None available. FINDINGS: MRI CERVICAL SPINE FINDINGS Alignment: Examination severely degraded by motion artifact, and is nearly nondiagnostic. Straightening of the normal cervical lordosis.  No listhesis. Vertebrae: Vertebral body height maintained without visible acute or chronic fracture. Bone marrow signal intensity grossly within normal limits. No visible discrete or worrisome osseous lesions. No abnormal marrow edema. Cord: Signal intensity within the cervical spinal cord is grossly within  normal limits. Grossly normal cord caliber and morphology. Posterior Fossa, vertebral arteries, paraspinal tissues: Visualized brain and posterior fossa within normal limits. Craniocervical junction normal. Paraspinous and prevertebral soft tissues grossly within normal limits. Vertebral artery flow voids grossly maintained. Disc levels: C2-C3: Unremarkable. C3-C4: Mild disc bulge. No definite spinal stenosis. Foramina are grossly patent. C4-C5: Mild disc bulge. No more than mild spinal stenosis. Foramina remain grossly patent. C5-C6: Mild diffuse disc bulge. No more than mild spinal stenosis. Foramina are grossly patent. C6-C7:  Grossly negative. C7-T1:  Grossly negative. MRI THORACIC SPINE FINDINGS Alignment: Examination severely degraded by motion artifact and nearly nondiagnostic. Alignment within normal limits with preservation of the normal thoracic kyphosis. No listhesis. Vertebrae: Vertebral body height maintained without acute or chronic fracture. Bone marrow signal intensity grossly within normal limits. No visible discrete or worrisome osseous lesions or abnormal marrow edema. Cord: Signal intensity within the thoracic spinal cord is grossly within normal limits. Grossly normal cord caliber and morphology. Mild diffuse prominence of the dorsal epidural fat noted. Paraspinal and other soft tissues: Paraspinous soft tissues demonstrate no obvious abnormality. Large layering bilateral pleural effusions noted, right larger than left. Disc levels: T9-10: Suspected small central disc protrusion mildly indents the ventral thecal sac (series 21, image 32). Minimal flattening of the ventral cord without cord signal changes or significant spinal stenosis. No other appreciable or significant disc pathology seen within the thoracic spine. No stenosis or neural impingement. MRI LUMBAR SPINE FINDINGS Segmentation: Examination severely degraded by motion artifact and nearly nondiagnostic. Standard segmentation. Lowest  well-formed disc space labeled the L5-S1 level. Alignment: Physiologic with preservation of the normal lumbar lordosis. No listhesis. Vertebrae: Vertebral body height maintained without acute or chronic fracture. Bone marrow signal intensity grossly within normal limits. No visible worrisome osseous lesions. No abnormal marrow edema. Conus medullaris and cauda equina: Conus extends to the L1 level. Conus and cauda equina appear grossly within normal limits. Paraspinal and other soft tissues: Paraspinous soft tissues demonstrate no obvious abnormality. Disc levels: L1-2:  Unremarkable. L2-3:  Unremarkable. L3-4: Minimal annular disc bulge. No spinal stenosis. Foramina appear grossly patent. L4-5: Minimal  disc bulge. No significant spinal stenosis. Foramina remain grossly patent. L5-S1:  Unremarkable. IMPRESSION: MRI CERVICAL SPINE IMPRESSION: 1. Severely limited exam due to extensive motion artifact. 2. Grossly normal MRI appearance of the cervical spinal cord. Mild noncompressive disc bulging at C3-4 through C5-6 with no more than mild spinal stenosis. MRI THORACIC SPINE IMPRESSION: 1. Severely limited exam due to extensive motion artifact. 2. Grossly normal MRI appearance of the thoracic spinal cord. No definite cord signal abnormality to suggest edema or myelomalacia. No significant spinal stenosis or neural impingement. 3. Suspected small central disc protrusion at T9-10 without significant stenosis. 4. Large layering bilateral pleural effusions, right greater than left. MRI LUMBAR SPINE IMPRESSION: 1. Severely limited exam due to motion artifact and nearly nondiagnostic exam. 2. No acute abnormality within the lumbar spine. No significant spinal stenosis or evidence for neural impingement. 3. Minimal disc bulging at L3-4 and L4-5 without significant stenosis. Electronically Signed   By: Rise Mu M.D.   On: 10/01/2020 16:19   MR LUMBAR SPINE WO CONTRAST  Result Date: 10/01/2020 CLINICAL DATA:   Initial evaluation for unsteady gait, slurred speech, myelopathy. EXAM: MRI CERVICAL, THORACIC AND LUMBAR SPINE WITHOUT CONTRAST TECHNIQUE: Multiplanar and multiecho pulse sequences of the cervical spine, to include the craniocervical junction and cervicothoracic junction, and thoracic and lumbar spine, were obtained without intravenous contrast. COMPARISON:  None available. FINDINGS: MRI CERVICAL SPINE FINDINGS Alignment: Examination severely degraded by motion artifact, and is nearly nondiagnostic. Straightening of the normal cervical lordosis.  No listhesis. Vertebrae: Vertebral body height maintained without visible acute or chronic fracture. Bone marrow signal intensity grossly within normal limits. No visible discrete or worrisome osseous lesions. No abnormal marrow edema. Cord: Signal intensity within the cervical spinal cord is grossly within normal limits. Grossly normal cord caliber and morphology. Posterior Fossa, vertebral arteries, paraspinal tissues: Visualized brain and posterior fossa within normal limits. Craniocervical junction normal. Paraspinous and prevertebral soft tissues grossly within normal limits. Vertebral artery flow voids grossly maintained. Disc levels: C2-C3: Unremarkable. C3-C4: Mild disc bulge. No definite spinal stenosis. Foramina are grossly patent. C4-C5: Mild disc bulge. No more than mild spinal stenosis. Foramina remain grossly patent. C5-C6: Mild diffuse disc bulge. No more than mild spinal stenosis. Foramina are grossly patent. C6-C7:  Grossly negative. C7-T1:  Grossly negative. MRI THORACIC SPINE FINDINGS Alignment: Examination severely degraded by motion artifact and nearly nondiagnostic. Alignment within normal limits with preservation of the normal thoracic kyphosis. No listhesis. Vertebrae: Vertebral body height maintained without acute or chronic fracture. Bone marrow signal intensity grossly within normal limits. No visible discrete or worrisome osseous lesions or  abnormal marrow edema. Cord: Signal intensity within the thoracic spinal cord is grossly within normal limits. Grossly normal cord caliber and morphology. Mild diffuse prominence of the dorsal epidural fat noted. Paraspinal and other soft tissues: Paraspinous soft tissues demonstrate no obvious abnormality. Large layering bilateral pleural effusions noted, right larger than left. Disc levels: T9-10: Suspected small central disc protrusion mildly indents the ventral thecal sac (series 21, image 32). Minimal flattening of the ventral cord without cord signal changes or significant spinal stenosis. No other appreciable or significant disc pathology seen within the thoracic spine. No stenosis or neural impingement. MRI LUMBAR SPINE FINDINGS Segmentation: Examination severely degraded by motion artifact and nearly nondiagnostic. Standard segmentation. Lowest well-formed disc space labeled the L5-S1 level. Alignment: Physiologic with preservation of the normal lumbar lordosis. No listhesis. Vertebrae: Vertebral body height maintained without acute or chronic fracture. Bone marrow signal intensity grossly  within normal limits. No visible worrisome osseous lesions. No abnormal marrow edema. Conus medullaris and cauda equina: Conus extends to the L1 level. Conus and cauda equina appear grossly within normal limits. Paraspinal and other soft tissues: Paraspinous soft tissues demonstrate no obvious abnormality. Disc levels: L1-2:  Unremarkable. L2-3:  Unremarkable. L3-4: Minimal annular disc bulge. No spinal stenosis. Foramina appear grossly patent. L4-5: Minimal disc bulge. No significant spinal stenosis. Foramina remain grossly patent. L5-S1:  Unremarkable. IMPRESSION: MRI CERVICAL SPINE IMPRESSION: 1. Severely limited exam due to extensive motion artifact. 2. Grossly normal MRI appearance of the cervical spinal cord. Mild noncompressive disc bulging at C3-4 through C5-6 with no more than mild spinal stenosis. MRI THORACIC  SPINE IMPRESSION: 1. Severely limited exam due to extensive motion artifact. 2. Grossly normal MRI appearance of the thoracic spinal cord. No definite cord signal abnormality to suggest edema or myelomalacia. No significant spinal stenosis or neural impingement. 3. Suspected small central disc protrusion at T9-10 without significant stenosis. 4. Large layering bilateral pleural effusions, right greater than left. MRI LUMBAR SPINE IMPRESSION: 1. Severely limited exam due to motion artifact and nearly nondiagnostic exam. 2. No acute abnormality within the lumbar spine. No significant spinal stenosis or evidence for neural impingement. 3. Minimal disc bulging at L3-4 and L4-5 without significant stenosis. Electronically Signed   By: Rise Mu M.D.   On: 10/01/2020 16:19   US RENAL  Result Date: 09/30/2020 CLINICAL DATA:  Renal failure. EXAM: RENAL / URINARY TRACT ULTRASOUND COMPLETE COMPARISON:  None. FINDINGS: Right Kidney: Renal measurements: 10.8 x 4.4 x 5.8 cm = volume: 143 mL. No hydronephrosis. Parenchymal echogenicity appears diffusely increased. Left Kidney: Renal measurements: 10.6 x 5.9 x 6.0 cm = volume: 196 mL. No hydronephrosis. Diffusely increased parenchymal echogenicity. Bladder: Unremarkable although there are some dependent low level internal echoes or debris. Other: Bilateral pleural effusions noted with apparent trace Peri renal edema bilaterally. IMPRESSION: 1. No evidence for hydronephrosis. 2. Increased echogenicity of both kidneys suggest medical renal disease. 3. Bilateral pleural effusions with probable mild perinephric edema bilaterally. Electronically Signed   By: Kennith Center M.D.   On: 09/30/2020 10:11   ECHOCARDIOGRAM COMPLETE BUBBLE STUDY  Result Date: 10/01/2020    ECHOCARDIOGRAM REPORT   Patient Name:   YAAKOV SAINDON Date of Exam: 09/30/2020 Medical Rec #:  [id number]     Height:       71.0 in Accession #:    [id number]    Weight:       170.0 lb Date of Birth:   [birth year]     BSA:          1.968 m Patient Age:    54 years      BP:           171/93 mmHg Patient Gender: M             HR:           88 bpm. Exam Location:  [facility] Procedure: 2D Echo, Color Doppler, Cardiac Doppler, Strain Analysis and Saline            Contrast Bubble Study Indications:     I63.9 Stroke  History:         Patient has no prior history of Echocardiogram examinations.                  Risk Factors:Hypertension and Diabetes.  Sonographer:     Humphrey Rolls RDCS (AE) Referring Phys:  [id number] [facility] Diagnosing Phys:  Alwyn Pea MD  Sonographer Comments: Global longitudinal strain was attempted. IMPRESSIONS  1. Negative bubble study.  2. Left ventricular ejection fraction, by estimation, is 55 to 60%. The left ventricle has normal function. The left ventricle has no regional wall motion abnormalities. The left ventricular internal cavity size was mildly dilated. There is mild concentric left ventricular hypertrophy. Left ventricular diastolic parameters are consistent with Grade I diastolic dysfunction (impaired relaxation).  3. Right ventricular systolic function is normal. The right ventricular size is normal.  4. The mitral valve is normal in structure. Trivial mitral valve regurgitation.  5. The aortic valve is normal in structure. Aortic valve regurgitation is not visualized.  6. Aortic dilatation noted and Mild dilatation. FINDINGS  Left Ventricle: Left ventricular ejection fraction, by estimation, is 55 to 60%. The left ventricle has normal function. The left ventricle has no regional wall motion abnormalities. The left ventricular internal cavity size was mildly dilated. There is  mild concentric left ventricular hypertrophy. Left ventricular diastolic parameters are consistent with Grade I diastolic dysfunction (impaired relaxation). Right Ventricle: The right ventricular size is normal. No increase in right ventricular wall thickness. Right ventricular systolic function is normal.  Left Atrium: Left atrial size was normal in size. Right Atrium: Right atrial size was normal in size. Pericardium: Trivial pericardial effusion is present. Mitral Valve: The mitral valve is normal in structure. There is mild thickening of the mitral valve leaflet(s). Trivial mitral valve regurgitation. MV peak gradient, 6.7 mmHg. The mean mitral valve gradient is 2.0 mmHg. Tricuspid Valve: The tricuspid valve is normal in structure. Tricuspid valve regurgitation is trivial. Aortic Valve: The aortic valve is normal in structure. Aortic valve regurgitation is not visualized. Aortic valve mean gradient measures 2.0 mmHg. Aortic valve peak gradient measures 2.9 mmHg. Aortic valve area, by VTI measures 2.60 cm. Pulmonic Valve: The pulmonic valve was normal in structure. Pulmonic valve regurgitation is not visualized. Aorta: Aortic dilatation noted and Mild dilatation. IAS/Shunts: No atrial level shunt detected by color flow Doppler. Agitated saline contrast was given intravenously to evaluate for intracardiac shunting. Additional Comments: Negative bubble study.  LEFT VENTRICLE PLAX 2D LVIDd:         4.20 cm  Diastology LVIDs:         2.90 cm  LV e' medial:    4.24 cm/s LV PW:         1.50 cm  LV E/e' medial:  23.6 LV IVS:        1.20 cm  LV e' lateral:   4.79 cm/s LVOT diam:     2.00 cm  LV E/e' lateral: 20.9 LV SV:         39 LV SV Index:   20 LVOT Area:     3.14 cm  RIGHT VENTRICLE RV Basal diam:  3.40 cm LEFT ATRIUM             Index       RIGHT ATRIUM           Index LA diam:        4.30 cm 2.19 cm/m  RA Area:     17.60 cm LA Vol (A2C):   84.5 ml 42.94 ml/m RA Volume:   49.60 ml  25.21 ml/m LA Vol (A4C):   57.3 ml 29.12 ml/m LA Biplane Vol: 70.6 ml 35.88 ml/m  AORTIC VALVE                   PULMONIC VALVE AV Area (  Vmax):    2.48 cm    PV Vmax:       0.76 m/s AV Area (Vmean):   2.30 cm    PV Vmean:      53.200 cm/s AV Area (VTI):     2.60 cm    PV VTI:        0.128 m AV Vmax:           85.10 cm/s  PV Peak  grad:  2.3 mmHg AV Vmean:          61.900 cm/s PV Mean grad:  1.0 mmHg AV VTI:            0.150 m AV Peak Grad:      2.9 mmHg AV Mean Grad:      2.0 mmHg LVOT Vmax:         67.20 cm/s LVOT Vmean:        45.400 cm/s LVOT VTI:          0.124 m LVOT/AV VTI ratio: 0.83  AORTA Ao Root diam: 4.10 cm MITRAL VALVE                TRICUSPID VALVE MV Area (PHT): 4.60 cm     TR Peak grad:   53.6 mmHg MV Area VTI:   1.85 cm     TR Vmax:        366.00 cm/s MV Peak grad:  6.7 mmHg MV Mean grad:  2.0 mmHg     SHUNTS MV Vmax:       1.29 m/s     Systemic VTI:  0.12 m MV Vmean:      72.9 cm/s    Systemic Diam: 2.00 cm MV Decel Time: 165 msec MV E velocity: 100.00 cm/s MV A velocity: 75.00 cm/s MV E/A ratio:  1.33 Dwayne D Callwood MD Electronically signed by Alwyn Pea MD Signature Date/Time: 10/01/2020/7:42:01 AM    Final      Medications   Scheduled Meds: . aspirin EC  81 mg Oral Daily  . atorvastatin  40 mg Oral QHS  . furosemide  40 mg Intravenous BID  . hydrALAZINE  50 mg Oral BID  . hydrocerin   Topical BID  . insulin aspart  0-15 Units Subcutaneous TID AC & HS  . nicotine  14 mg Transdermal Daily   Continuous Infusions: . cefTRIAXone (ROCEPHIN)  IV 1 g (09/30/20 2225)  . dextrose 5 % and 0.45% NaCl 75 mL/hr at 10/01/20 1214       LOS: 1 day    Time spent: 25 minutes with >50% spent at bedside and in coordination of care.    Pennie Banter, DO [facility]  10/01/2020, 7:06 PM      If 7PM-7AM, please contact night-coverage. How to contact the [facility] Attending or Consulting provider 7A - 7P or covering provider during after hours 7P -7A, for this patient?    1. Check the care team in [facility] and look for a) attending/consulting [facility] provider listed and b) the [facility] team listed 2. Log into [email or web address] and use [facility]'s universal password to access. If you do not have the password, please contact the hospital operator. 3. Locate the [facility] provider you are looking for under [facility] and page to a number that you can be directly reached. 4. If you still have difficulty reaching the provider, please page the [facility] (Director on Call) for the Hospitalists listed on amion for assistance.

## 2020-10-01 NOTE — Progress Notes (Signed)
Inpatient Rehab Admissions Coordinator:   Attempted to reach pt by phone but no answer.  Will try again tomorrow.   Estill Dooms, PT, DPT Admissions Coordinator [phone] 10/01/20  3:10 PM

## 2020-10-02 DIAGNOSIS — I160 Hypertensive urgency: Secondary | ICD-10-CM | POA: Diagnosis not present

## 2020-10-02 DIAGNOSIS — I63521 Cerebral infarction due to unspecified occlusion or stenosis of right anterior cerebral artery: Secondary | ICD-10-CM

## 2020-10-02 DIAGNOSIS — N179 Acute kidney failure, unspecified: Secondary | ICD-10-CM | POA: Diagnosis not present

## 2020-10-02 DIAGNOSIS — J90 Pleural effusion, not elsewhere classified: Secondary | ICD-10-CM | POA: Diagnosis not present

## 2020-10-02 LAB — BASIC METABOLIC PANEL
Anion gap: 11 (ref 5–15)
BUN: 37 mg/dL — ABNORMAL HIGH (ref 6–20)
CO2: 24 mmol/L (ref 22–32)
Calcium: 7.8 mg/dL — ABNORMAL LOW (ref 8.9–10.3)
Chloride: 108 mmol/L (ref 98–111)
Creatinine, Ser: 2.26 mg/dL — ABNORMAL HIGH (ref 0.61–1.24)
GFR, Estimated: 33 mL/min — ABNORMAL LOW (ref >60)
Glucose, Bld: 113 mg/dL — ABNORMAL HIGH (ref 70–99)
Potassium: 4.3 mmol/L (ref 3.5–5.1)
Sodium: 143 mmol/L (ref 135–145)

## 2020-10-02 LAB — RPR: RPR Ser Ql: NONREACTIVE

## 2020-10-02 LAB — GLUCOSE, CAPILLARY
Glucose-Capillary: 130 mg/dL — ABNORMAL HIGH (ref 70–99)
Glucose-Capillary: 134 mg/dL — ABNORMAL HIGH (ref 70–99)
Glucose-Capillary: 117 mg/dL — ABNORMAL HIGH (ref 70–99)
Glucose-Capillary: 155 mg/dL — ABNORMAL HIGH (ref 70–99)

## 2020-10-02 MED ORDER — HYDRALAZINE HCL 20 MG/ML IJ SOLN
10.0000 mg | Freq: Four times a day (QID) | INTRAMUSCULAR | Status: DC | PRN
  Administered 2020-10-02: 10 mg via INTRAVENOUS
  Filled 2020-10-02: qty 1

## 2020-10-02 MED ORDER — GUAIFENESIN ER 600 MG PO TB12
600.0000 mg | ORAL_TABLET | Freq: Two times a day (BID) | ORAL | Status: DC | PRN
  Administered 2020-10-04: 600 mg via ORAL
  Filled 2020-10-02: qty 1

## 2020-10-02 MED ORDER — SODIUM CHLORIDE 0.9 % IV SOLN: INTRAVENOUS | Status: DC

## 2020-10-02 MED ORDER — HYDRALAZINE HCL 50 MG PO TABS
50.0000 mg | ORAL_TABLET | Freq: Three times a day (TID) | ORAL | Status: DC
  Administered 2020-10-02 (×3): 50 mg via ORAL
  Filled 2020-10-02 (×3): qty 1

## 2020-10-02 MED ORDER — AMLODIPINE BESYLATE 5 MG PO TABS
5.0000 mg | ORAL_TABLET | Freq: Every day | ORAL | Status: DC
  Administered 2020-10-02: 5 mg via ORAL
  Filled 2020-10-02: qty 1

## 2020-10-02 MED ORDER — DEXTROSE-NACL 5-0.9 % IV SOLN: INTRAVENOUS | Status: DC

## 2020-10-02 NOTE — Progress Notes (Signed)
PROGRESS NOTE    Tommy Perez   ZOX:[id number]  DOB: 1965-12-19  PCP: Patient, No Pcp Per    DOA: 09/29/2020 LOS: 2   Brief Narrative   55 year old male with past medical history of hypertension, diabetes mellitus type 2, nicotine dependence and medication noncompliance who presented to the ED on 09/29/20 with complaints of unsteady gait with frequent falls and slurred speech.  Symptoms had started 2-3 weeks prior but patient did not seek any medical attention.  MRI brain showed an area of restricted diffusion in the right anterior angulate gyrus.      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  MRI as above with acute/subacute stroke in addition to multiple prior/remote infarcts in bilateral cerebral hemispheres, L thalamus, b/l centrum semiovale -- ? cardioembolic etiology of strokes. 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 TSH up but free T4 normal.  B12 level 514. RPR and HIV negative.   Hypertensive urgency - initially allowed for permissive HTN, okay to start slowly treating at this point.  Is getting IV Lasix BID.  Increase hydralazine 50 mg BID>>TID.  PRN oral hydralazine. Add amlodipine.  Acute diastolic CHF / HFpEF - presented with elevated BNP 1264 and bilateral pleural effusions.  Mild LVH on Echo.  CHF most likely due to longstanding uncontrolled HTN.   Echo EF 55-60%, grade 1 diastolic dfx, mild LVH Net IO Since Admission: -161.01 mL [10/02/20 2110] --Stop IV Lasix with rise in Cr --Monitor I/O's, daily weights --Monitor renal function and electrolytes  Complicated UTI - POA.  UA with pyuria and leuk-esterase.   Started on empiric antibiotics on admission.  Continue abx and follow up on urine culture.  AKI superimposed on CKD stage IIIa - POA with Cr 1.72 >> 2.17 after diuresis.  Baseline unclear but long history uncontrolled HTN and diabetes makes CKD IIIa most likely estimated baseline. AKI suspected multifactorial due to reduced PO intake since stroke, UTI --Abx for UTI as above --continue gentle IV fluids  --daily BMP  Hypernatremia - Na 147 this AM.  Resolved with D5-1/2NS fluids. and recheck BMP tomorrow.  Due to poor PO intake due to above. Fluids changed to NS for AKI.  Type 2 diabetes mellitus - sliding scale Novolog.  Follow up A1c.   Pleural effusion, bilateral - suspect due to hypertensive / diastolic heart failure.  Started on IV lasix on admission, but need to stop due to worsened renal function.  Monitor respiratory status.  Other mgmt as above. --US guided thoracentesis for diagnostic/therapeutic --Fluid studies ordered  Elevated troponin due to demand ischemia - mildly elevated and flat trend, no chest pain or acute ischemic changes on EKG. Consistent with demand ischemia in setting of hypertensive urgency / CHF.     Nicotine dependence, cigarettes, uncomplicated - nicotine patch ordered per pt request.  Counseled on importance of cessation.    DVT prophylaxis: SCD's Start: 09/29/20 2226   Diet:  Diet Orders (From admission, onward)    Start     Ordered   10/01/20 0924  DIET DYS 3 Room service appropriate? Yes; Fluid consistency: Nectar Thick  Diet effective now       Question Answer Comment  Room service appropriate? Yes   Fluid consistency: Nectar Thick      10/01/20 0924            Code Status: Full Code    Subjective 10/02/20    Patient seen up in chair today, male friend who is his ex-wife at bedside.  Patient reports feeling well.  Asks when he gets out of the hospital.  Speech sounds better today.  Says he also feels slightly stronger.     Disposition Plan & Communication   Status is: Inpatient  Inpatient status remains appropriate due to ongoing diagnostic evaluation not appropriate for outpatient setting, uncontrolled BP titrating medications, and pending CIR consult for dispo.  Dispo: The patient is from: Home              Anticipated d/c is to: CIR              Anticipated d/c date is: 2-3 days              Patient currently is not medically stable for d/c.   Difficult to place patient: No   Family Communication: none at bedside, will attempt to call   Consults, Procedures, Significant Events   Consultants:   Neurology  Procedures:   None  Antimicrobials:  Anti-infectives (From admission, onward)   Start     Dose/Rate Route Frequency Ordered Stop   09/29/20 2100  cefTRIAXone (ROCEPHIN) 1 g in sodium chloride 0.9 % 100 mL IVPB        1 g 200 mL/hr over 30 Minutes Intravenous Every 24 hours 09/29/20 2015 10/04/20 2059        Objective   Vitals:   10/02/20 1128 10/02/20 1340 10/02/20 1614 10/02/20 1731  BP: (!) 201/107 (!) 201/104 (!) 193/109 (!) 210/115  Pulse: 92 93 89 92  Resp: 16  15 16   Temp: 98.2 F (36.8 C)  (!) 97.5 F (36.4 C)    TempSrc: Oral  Oral   SpO2:   100% 100%  Weight:      Height:        Intake/Output Summary (Last 24 hours) at 10/02/2020 2110 Last data filed at 10/02/2020 1531 Gross per 24 hour  Intake 2343.99 ml  Output --  Net 2343.99 ml   Filed Weights   09/29/20 1410  Weight: 77.1 kg    Physical Exam:  General exam: awake, alert, no acute distress Respiratory system: symmetric chest rise, normal respiratory effort. Cardiovascular system: RRR, no pedal edema.   Central nervous system: dysarthric speech improved, b/l lateral gaze stable, no new gross focal deficits Skin: dry, intact, normal temperature  Labs   Data Reviewed: I have personally reviewed following labs and imaging studies  CBC: Recent Labs  Lab 09/29/20 1435  WBC 6.6  HGB 13.3  HCT 44.3  MCV 89.3  PLT 191   Basic Metabolic Panel: Recent Labs  Lab 09/29/20 1435 10/01/20 0507 10/02/20 0513  NA 146* 147* 143  K 4.6 4.6 4.3  CL 115* 111 108  CO2 21* 26 24  GLUCOSE 108* 78 113*  BUN 29* 35* 37*  CREATININE 1.72* 2.17* 2.26*  CALCIUM 7.9* 8.0* 7.8*  MG  --  2.2  --    GFR: Estimated Creatinine Clearance: 39.3 mL/min (A) (by C-G formula based on SCr of 2.26 mg/dL (H)). Liver Function Tests: Recent Labs  Lab 09/29/20 1435  AST 28  ALT 38  ALKPHOS 127*  BILITOT 0.4  PROT 6.9  ALBUMIN 2.5*   No results for input(s): LIPASE, AMYLASE in the last 168 hours. No results for input(s): AMMONIA in the last 168 hours. Coagulation Profile: No results for input(s): INR, PROTIME in the last 168 hours. Cardiac Enzymes: No results for input(s): CKTOTAL, CKMB, CKMBINDEX, TROPONINI in the last 168 hours. BNP (last 3 results) No results for input(s): PROBNP in the last 8760 hours. HbA1C: Recent Labs    09/30/20 0435  HGBA1C 8.4*   CBG: Recent Labs  Lab 10/01/20 1633 10/01/20 2146 10/02/20 0804 10/02/20 1132 10/02/20 1734  GLUCAP 112* 120* 130* 134* 117*   Lipid Profile: Recent Labs    09/30/20 0435   CHOL 173  HDL 66  LDLCALC 87  TRIG 99  CHOLHDL 2.6   Thyroid Function Tests: Recent Labs    10/01/20 0507 10/01/20 1105  TSH  --  5.877*  FREET4 0.87  --    Anemia Panel: Recent Labs    10/01/20 1105  VITAMINB12 574   Sepsis Labs: Recent Labs  Lab 09/29/20 1841  PROCALCITON <0.10    Recent Results (from the past 240 hour(s))  Resp Panel by RT-PCR (Flu A&B, Covid) Nasopharyngeal Swab     Status: None   Collection Time: 09/29/20  5:00 PM   Specimen: Nasopharyngeal Swab; Nasopharyngeal(NP) swabs in vial transport medium  Result Value Ref Range Status   SARS Coronavirus 2 by RT PCR NEGATIVE NEGATIVE Final    Comment: (NOTE) SARS-CoV-2 target nucleic acids are NOT DETECTED.  The SARS-CoV-2 RNA is generally detectable in upper respiratory specimens during the acute phase of infection. The lowest concentration of SARS-CoV-2 viral copies this assay can detect is 138 copies/mL. A negative result does not preclude SARS-Cov-2 infection and should not be used as the sole basis for treatment or other patient management decisions. A negative result may occur with  improper specimen collection/handling, submission of specimen other than nasopharyngeal swab, presence of viral mutation(s) within the areas targeted by this assay, and inadequate number of viral copies(<138 copies/mL). A negative result must be combined with clinical observations, patient history, and epidemiological information. The expected result is Negative.  Fact Sheet for Patients:  BloggerCourse.com  Fact Sheet for Healthcare Providers:  SeriousBroker.it  This test is no t yet approved or cleared by the [place] FDA and  has been authorized for detection and/or diagnosis of SARS-CoV-2 by FDA under an Emergency Use Authorization (EUA). This EUA will remain  in effect (meaning this test can be used) for the duration of the COVID-19 declaration under  Section 564(b)(1) of the Act, 21 U.S.C.section 360bbb-3(b)(1), unless the authorization is terminated  or revoked sooner.       Influenza A by PCR NEGATIVE NEGATIVE Final   Influenza B by PCR NEGATIVE NEGATIVE Final    Comment: (NOTE) The  Xpert Xpress SARS-CoV-2/FLU/RSV plus assay is intended as an aid in the diagnosis of influenza from Nasopharyngeal swab specimens and should not be used as a sole basis for treatment. Nasal washings and aspirates are unacceptable for Xpert Xpress SARS-CoV-2/FLU/RSV testing.  Fact Sheet for Patients: BloggerCourse.com  Fact Sheet for Healthcare Providers: SeriousBroker.it  This test is not yet approved or cleared by the [place] FDA and has been authorized for detection and/or diagnosis of SARS-CoV-2 by FDA under an Emergency Use Authorization (EUA). This EUA will remain in effect (meaning this test can be used) for the duration of the COVID-19 declaration under Section 564(b)(1) of the Act, 21 U.S.C. section 360bbb-3(b)(1), unless the authorization is terminated or revoked.  Performed at [facility], [address] [place], [place] [address]   Culture, blood (Routine X 2) w Reflex to ID Panel     Status: None (Preliminary result)   Collection Time: 09/29/20  6:41 PM   Specimen: BLOOD  Result Value Ref Range Status   Specimen Description BLOOD BLOOD RIGHT HAND  Final   Special Requests   Final    BOTTLES DRAWN AEROBIC AND ANAEROBIC Blood Culture results may not be optimal due to an inadequate volume of blood received in culture bottles   Culture   Final    NO GROWTH 3 DAYS Performed at [facility], [address], [place], [place] [address]    Report Status PENDING  Incomplete  Culture, blood (Routine X 2) w Reflex to ID Panel     Status: None (Preliminary result)   Collection Time: 09/29/20  6:53 PM   Specimen: BLOOD  Result Value Ref Range Status   Specimen  Description BLOOD BLOOD RIGHT FOREARM  Final   Special Requests   Final    BOTTLES DRAWN AEROBIC AND ANAEROBIC Blood Culture adequate volume   Culture   Final    NO GROWTH 3 DAYS Performed at [facility], [address], [place], [place] [address]    Report Status PENDING  Incomplete  Urine Culture     Status: Abnormal   Collection Time: 09/30/20  1:18 AM   Specimen: Urine, Random  Result Value Ref Range Status   Specimen Description   Final    URINE, RANDOM Performed at [facility], [address], [place], [place] [address]    Special Requests   Final    NONE Performed at [facility], [address] [place], [place] [address]    Culture (A)  Final    <10,000 COLONIES/mL INSIGNIFICANT GROWTH Performed at [facility], [address], [place], [place] [address]    Report Status 10/01/2020 FINAL  Final      Imaging Studies   MR CERVICAL SPINE WO CONTRAST  Result Date: 10/01/2020 CLINICAL DATA:  Initial evaluation for unsteady gait, slurred speech, myelopathy. EXAM: MRI CERVICAL, THORACIC AND LUMBAR SPINE WITHOUT CONTRAST TECHNIQUE: Multiplanar and multiecho pulse sequences of the cervical spine, to include the craniocervical junction and cervicothoracic junction, and thoracic and lumbar spine, were obtained without intravenous contrast. COMPARISON:  None available. FINDINGS: MRI CERVICAL SPINE FINDINGS Alignment: Examination severely degraded by motion artifact, and is nearly nondiagnostic. Straightening of the normal cervical lordosis.  No listhesis. Vertebrae: Vertebral body height maintained without visible acute or chronic fracture. Bone marrow signal intensity grossly within normal limits. No visible discrete or worrisome osseous lesions. No abnormal marrow edema. Cord: Signal intensity within the cervical spinal cord is grossly within normal limits. Grossly  normal cord caliber and morphology. Posterior Fossa, vertebral arteries, paraspinal  tissues: Visualized brain and posterior fossa within normal limits. Craniocervical junction normal. Paraspinous and prevertebral soft tissues grossly within normal limits. Vertebral artery flow voids grossly maintained. Disc levels: C2-C3: Unremarkable. C3-C4: Mild disc bulge. No definite spinal stenosis. Foramina are grossly patent. C4-C5: Mild disc bulge. No more than mild spinal stenosis. Foramina remain grossly patent. C5-C6: Mild diffuse disc bulge. No more than mild spinal stenosis. Foramina are grossly patent. C6-C7:  Grossly negative. C7-T1:  Grossly negative. MRI THORACIC SPINE FINDINGS Alignment: Examination severely degraded by motion artifact and nearly nondiagnostic. Alignment within normal limits with preservation of the normal thoracic kyphosis. No listhesis. Vertebrae: Vertebral body height maintained without acute or chronic fracture. Bone marrow signal intensity grossly within normal limits. No visible discrete or worrisome osseous lesions or abnormal marrow edema. Cord: Signal intensity within the thoracic spinal cord is grossly within normal limits. Grossly normal cord caliber and morphology. Mild diffuse prominence of the dorsal epidural fat noted. Paraspinal and other soft tissues: Paraspinous soft tissues demonstrate no obvious abnormality. Large layering bilateral pleural effusions noted, right larger than left. Disc levels: T9-10: Suspected small central disc protrusion mildly indents the ventral thecal sac (series 21, image 32). Minimal flattening of the ventral cord without cord signal changes or significant spinal stenosis. No other appreciable or significant disc pathology seen within the thoracic spine. No stenosis or neural impingement. MRI LUMBAR SPINE FINDINGS Segmentation: Examination severely degraded by motion artifact and nearly nondiagnostic. Standard segmentation. Lowest well-formed disc space labeled the L5-S1 level. Alignment: Physiologic with preservation of the normal  lumbar lordosis. No listhesis. Vertebrae: Vertebral body height maintained without acute or chronic fracture. Bone marrow signal intensity grossly within normal limits. No visible worrisome osseous lesions. No abnormal marrow edema. Conus medullaris and cauda equina: Conus extends to the L1 level. Conus and cauda equina appear grossly within normal limits. Paraspinal and other soft tissues: Paraspinous soft tissues demonstrate no obvious abnormality. Disc levels: L1-2:  Unremarkable. L2-3:  Unremarkable. L3-4: Minimal annular disc bulge. No spinal stenosis. Foramina appear grossly patent. L4-5: Minimal disc bulge. No significant spinal stenosis. Foramina remain grossly patent. L5-S1:  Unremarkable. IMPRESSION: MRI CERVICAL SPINE IMPRESSION: 1. Severely limited exam due to extensive motion artifact. 2. Grossly normal MRI appearance of the cervical spinal cord. Mild noncompressive disc bulging at C3-4 through C5-6 with no more than mild spinal stenosis. MRI THORACIC SPINE IMPRESSION: 1. Severely limited exam due to extensive motion artifact. 2. Grossly normal MRI appearance of the thoracic spinal cord. No definite cord signal abnormality to suggest edema or myelomalacia. No significant spinal stenosis or neural impingement. 3. Suspected small central disc protrusion at T9-10 without significant stenosis. 4. Large layering bilateral pleural effusions, right greater than left. MRI LUMBAR SPINE IMPRESSION: 1. Severely limited exam due to motion artifact and nearly nondiagnostic exam. 2. No acute abnormality within the lumbar spine. No significant spinal stenosis or evidence for neural impingement. 3. Minimal disc bulging at L3-4 and L4-5 without significant stenosis. Electronically Signed   By: Rise Mu M.D.   On: 10/01/2020 16:19   MR THORACIC SPINE WO CONTRAST  Result Date: 10/01/2020 CLINICAL DATA:  Initial evaluation for unsteady gait, slurred speech, myelopathy. EXAM: MRI CERVICAL, THORACIC AND  LUMBAR SPINE WITHOUT CONTRAST TECHNIQUE: Multiplanar and multiecho pulse sequences of the cervical spine, to include the craniocervical junction and cervicothoracic junction, and thoracic and lumbar spine, were obtained without intravenous contrast. COMPARISON:  None available. FINDINGS: MRI  CERVICAL SPINE FINDINGS Alignment: Examination severely degraded by motion artifact, and is nearly nondiagnostic. Straightening of the normal cervical lordosis.  No listhesis. Vertebrae: Vertebral body height maintained without visible acute or chronic fracture. Bone marrow signal intensity grossly within normal limits. No visible discrete or worrisome osseous lesions. No abnormal marrow edema. Cord: Signal intensity within the cervical spinal cord is grossly within normal limits. Grossly normal cord caliber and morphology. Posterior Fossa, vertebral arteries, paraspinal tissues: Visualized brain and posterior fossa within normal limits. Craniocervical junction normal. Paraspinous and prevertebral soft tissues grossly within normal limits. Vertebral artery flow voids grossly maintained. Disc levels: C2-C3: Unremarkable. C3-C4: Mild disc bulge. No definite spinal stenosis. Foramina are grossly patent. C4-C5: Mild disc bulge. No more than mild spinal stenosis. Foramina remain grossly patent. C5-C6: Mild diffuse disc bulge. No more than mild spinal stenosis. Foramina are grossly patent. C6-C7:  Grossly negative. C7-T1:  Grossly negative. MRI THORACIC SPINE FINDINGS Alignment: Examination severely degraded by motion artifact and nearly nondiagnostic. Alignment within normal limits with preservation of the normal thoracic kyphosis. No listhesis. Vertebrae: Vertebral body height maintained without acute or chronic fracture. Bone marrow signal intensity grossly within normal limits. No visible discrete or worrisome osseous lesions or abnormal marrow edema. Cord: Signal intensity within the thoracic spinal cord is grossly within normal  limits. Grossly normal cord caliber and morphology. Mild diffuse prominence of the dorsal epidural fat noted. Paraspinal and other soft tissues: Paraspinous soft tissues demonstrate no obvious abnormality. Large layering bilateral pleural effusions noted, right larger than left. Disc levels: T9-10: Suspected small central disc protrusion mildly indents the ventral thecal sac (series 21, image 32). Minimal flattening of the ventral cord without cord signal changes or significant spinal stenosis. No other appreciable or significant disc pathology seen within the thoracic spine. No stenosis or neural impingement. MRI LUMBAR SPINE FINDINGS Segmentation: Examination severely degraded by motion artifact and nearly nondiagnostic. Standard segmentation. Lowest well-formed disc space labeled the L5-S1 level. Alignment: Physiologic with preservation of the normal lumbar lordosis. No listhesis. Vertebrae: Vertebral body height maintained without acute or chronic fracture. Bone marrow signal intensity grossly within normal limits. No visible worrisome osseous lesions. No abnormal marrow edema. Conus medullaris and cauda equina: Conus extends to the L1 level. Conus and cauda equina appear grossly within normal limits. Paraspinal and other soft tissues: Paraspinous soft tissues demonstrate no obvious abnormality. Disc levels: L1-2:  Unremarkable. L2-3:  Unremarkable. L3-4: Minimal annular disc bulge. No spinal stenosis. Foramina appear grossly patent. L4-5: Minimal disc bulge. No significant spinal stenosis. Foramina remain grossly patent. L5-S1:  Unremarkable. IMPRESSION: MRI CERVICAL SPINE IMPRESSION: 1. Severely limited exam due to extensive motion artifact. 2. Grossly normal MRI appearance of the cervical spinal cord. Mild noncompressive disc bulging at C3-4 through C5-6 with no more than mild spinal stenosis. MRI THORACIC SPINE IMPRESSION: 1. Severely limited exam due to extensive motion artifact. 2. Grossly normal MRI  appearance of the thoracic spinal cord. No definite cord signal abnormality to suggest edema or myelomalacia. No significant spinal stenosis or neural impingement. 3. Suspected small central disc protrusion at T9-10 without significant stenosis. 4. Large layering bilateral pleural effusions, right greater than left. MRI LUMBAR SPINE IMPRESSION: 1. Severely limited exam due to motion artifact and nearly nondiagnostic exam. 2. No acute abnormality within the lumbar spine. No significant spinal stenosis or evidence for neural impingement. 3. Minimal disc bulging at L3-4 and L4-5 without significant stenosis. Electronically Signed   By: Rise Mu M.D.   On: 10/01/2020  16:19   MR LUMBAR SPINE WO CONTRAST  Result Date: 10/01/2020 CLINICAL DATA:  Initial evaluation for unsteady gait, slurred speech, myelopathy. EXAM: MRI CERVICAL, THORACIC AND LUMBAR SPINE WITHOUT CONTRAST TECHNIQUE: Multiplanar and multiecho pulse sequences of the cervical spine, to include the craniocervical junction and cervicothoracic junction, and thoracic and lumbar spine, were obtained without intravenous contrast. COMPARISON:  None available. FINDINGS: MRI CERVICAL SPINE FINDINGS Alignment: Examination severely degraded by motion artifact, and is nearly nondiagnostic. Straightening of the normal cervical lordosis.  No listhesis. Vertebrae: Vertebral body height maintained without visible acute or chronic fracture. Bone marrow signal intensity grossly within normal limits. No visible discrete or worrisome osseous lesions. No abnormal marrow edema. Cord: Signal intensity within the cervical spinal cord is grossly within normal limits. Grossly normal cord caliber and morphology. Posterior Fossa, vertebral arteries, paraspinal tissues: Visualized brain and posterior fossa within normal limits. Craniocervical junction normal. Paraspinous and prevertebral soft tissues grossly within normal limits. Vertebral artery flow voids grossly  maintained. Disc levels: C2-C3: Unremarkable. C3-C4: Mild disc bulge. No definite spinal stenosis. Foramina are grossly patent. C4-C5: Mild disc bulge. No more than mild spinal stenosis. Foramina remain grossly patent. C5-C6: Mild diffuse disc bulge. No more than mild spinal stenosis. Foramina are grossly patent. C6-C7:  Grossly negative. C7-T1:  Grossly negative. MRI THORACIC SPINE FINDINGS Alignment: Examination severely degraded by motion artifact and nearly nondiagnostic. Alignment within normal limits with preservation of the normal thoracic kyphosis. No listhesis. Vertebrae: Vertebral body height maintained without acute or chronic fracture. Bone marrow signal intensity grossly within normal limits. No visible discrete or worrisome osseous lesions or abnormal marrow edema. Cord: Signal intensity within the thoracic spinal cord is grossly within normal limits. Grossly normal cord caliber and morphology. Mild diffuse prominence of the dorsal epidural fat noted. Paraspinal and other soft tissues: Paraspinous soft tissues demonstrate no obvious abnormality. Large layering bilateral pleural effusions noted, right larger than left. Disc levels: T9-10: Suspected small central disc protrusion mildly indents the ventral thecal sac (series 21, image 32). Minimal flattening of the ventral cord without cord signal changes or significant spinal stenosis. No other appreciable or significant disc pathology seen within the thoracic spine. No stenosis or neural impingement. MRI LUMBAR SPINE FINDINGS Segmentation: Examination severely degraded by motion artifact and nearly nondiagnostic. Standard segmentation. Lowest well-formed disc space labeled the L5-S1 level. Alignment: Physiologic with preservation of the normal lumbar lordosis. No listhesis. Vertebrae: Vertebral body height maintained without acute or chronic fracture. Bone marrow signal intensity grossly within normal limits. No visible worrisome osseous lesions. No  abnormal marrow edema. Conus medullaris and cauda equina: Conus extends to the L1 level. Conus and cauda equina appear grossly within normal limits. Paraspinal and other soft tissues: Paraspinous soft tissues demonstrate no obvious abnormality. Disc levels: L1-2:  Unremarkable. L2-3:  Unremarkable. L3-4: Minimal annular disc bulge. No spinal stenosis. Foramina appear grossly patent. L4-5: Minimal disc bulge. No significant spinal stenosis. Foramina remain grossly patent. L5-S1:  Unremarkable. IMPRESSION: MRI CERVICAL SPINE IMPRESSION: 1. Severely limited exam due to extensive motion artifact. 2. Grossly normal MRI appearance of the cervical spinal cord. Mild noncompressive disc bulging at C3-4 through C5-6 with no more than mild spinal stenosis. MRI THORACIC SPINE IMPRESSION: 1. Severely limited exam due to extensive motion artifact. 2. Grossly normal MRI appearance of the thoracic spinal cord. No definite cord signal abnormality to suggest edema or myelomalacia. No significant spinal stenosis or neural impingement. 3. Suspected small central disc protrusion at T9-10 without significant stenosis. 4.  Large layering bilateral pleural effusions, right greater than left. MRI LUMBAR SPINE IMPRESSION: 1. Severely limited exam due to motion artifact and nearly nondiagnostic exam. 2. No acute abnormality within the lumbar spine. No significant spinal stenosis or evidence for neural impingement. 3. Minimal disc bulging at L3-4 and L4-5 without significant stenosis. Electronically Signed   By: Rise Mu M.D.   On: 10/01/2020 16:19     Medications   Scheduled Meds: . amLODipine  5 mg Oral Daily  . aspirin EC  81 mg Oral Daily  . atorvastatin  40 mg Oral QHS  . hydrALAZINE  50 mg Oral TID  . hydrocerin   Topical BID  . insulin aspart  0-15 Units Subcutaneous TID AC & HS  . nicotine  14 mg Transdermal Daily   Continuous Infusions: . cefTRIAXone (ROCEPHIN)  IV Stopped (10/01/20 2220)  . dextrose 5 %  and 0.9% NaCl 75 mL/hr at 10/02/20 1531       LOS: 2 days    Time spent: 25 minutes with >50% spent at bedside and in coordination of care.    Pennie Banter, DO [facility]  10/02/2020, 9:10 PM      If 7PM-7AM, please contact night-coverage. How to contact the [facility] Attending or Consulting provider 7A - 7P or covering provider during after hours 7P -7A, for this patient?    1. Check the care team in [facility] and look for a) attending/consulting [facility] provider listed and b) the [facility] team listed 2. Log into [email or web address] and use [place]'s universal password to access. If you do not have the password, please contact the hospital operator. 3. Locate the [facility] provider you are looking for under [facility] and page to a number that you can be directly reached. 4. If you still have difficulty reaching the provider, please page the [facility] (Director on Call) for the Hospitalists listed on amion for assistance.

## 2020-10-02 NOTE — Progress Notes (Signed)
Pt MEWS yellow, pt BP elevated. MD made aware, new orders in place.

## 2020-10-02 NOTE — Progress Notes (Signed)
Md aware of BP post PRN, MD stated they would make changes to the meds, awaiting new orders.

## 2020-10-02 NOTE — Progress Notes (Signed)
Pt in bed resting quietly. BP 203-116. Gave PRN Hydralazine 10 mg IV. Notified on call NP. No new orders received at this time. Will continue to monitor.

## 2020-10-02 NOTE — Progress Notes (Signed)
Neurology Progress Note  Patient ID: Tommy Perez is a 55 y.o. male with past medical history significant for hypertension (poorly controlled, blood pressures typically in the 170s at home), diabetes (poorly controlled, A1c 9.9% per Care Everywhere cardiology note), ongoing tobacco abuse (1 pack/day, 40-pack-year history), prior substance abuse, nonadherence to medical treatment.  Initially consulted for:  Stroke  Subjective: Feels well, no acute complaints  Exam: Vitals:   10/02/20 0155 10/02/20 1128  BP: (!) 178/100 (!) 201/107  Pulse: 92 92  Resp: 16 16  Temp: 98.7 F (37.1 C) 98.2 F (36.8 C)  SpO2: 100%    Gen: In bed, comfortable  Resp: non-labored breathing, no grossly audible wheezing Cardiac: Perfusing extremities well  Abd: soft, nt  Neuro: MS: Awake, alert, able to answer questions about presidents with more alacrity today CN: Face symmetric, baseline lazy eye, tongue midline Motor: Bilateral lower extremities hip flexion 4+/5 (improved from yesterday), otherwise 5/5 throughout the lower extremities Sensory: Intact to light touch throughout  Pertinent Data: I have reviewed the images obtained: MRI brain w/ small stroke in the right ACA territory, chronic microvascular disease fairly advanced for age, a few microhemorrhages    MRA head and MRA neck personally reviewed, no significant atherosclerosis  ECHO:  1. Negative bubble study.  2. Left ventricular ejection fraction, by estimation, is 55 to 60%. The  left ventricle has normal function. The left ventricle has no regional  wall motion abnormalities. The left ventricular internal cavity size was  mildly dilated. There is mild  concentric left ventricular hypertrophy. Left ventricular diastolic  parameters are consistent with Grade I diastolic dysfunction (impaired  relaxation).  3. Right ventricular systolic function is normal. The right ventricular  size is normal.  4. The mitral valve is normal in  structure. Trivial mitral valve  regurgitation.  5. The aortic valve is normal in structure. Aortic valve regurgitation is  not visualized.  6. Aortic dilatation noted and Mild dilatation.  Normal biatrial sizes  Personally reviewed MRI spine.  Agree with radiology read that this is essentially nondiagnostic.  Impression: This is a 55 year old man with uncontrolled small vessel risk factors as detailed above.  Stroke is likely embolic of unknown source given its location though could potentially be due to small vessel disease as well. Given his young age, additionally will add hypercoagulability panel to his work-up.  Did not send factor V Leiden as I am looking more for arterial hypercoagulability, did not send Antithrombin III, but would consider this if homocystine returns high, did not send prothrombin 3 gene mutation as it is primarily seen in white patients.   His lower extremity strength is improving, question if there was an element of encephalopathy secondary to hypertension given his mental status is improving today as well  Recommendations: # Right ACA territory (cingulate gyrus), embolic of unknown source - Stroke labs HgbA1c, fasting lipid panel (A1c 8.4, LDL 87) - Hypercoagulability workup ordered given his young age, pending  - MRI brain completed as above - MRA of the brain without contrast and MRA neck w/wo completed as above - Echocardiogram completed, read as above - Prophylactic therapy-Antiplatelet med: Aspirin - dose 325mg  PO or 300mg  PR, followed by 81 mg daily - Consider Plavix 300 mg load with 75 mg daily for 21 - 90 day course  - Atorvastatin 40 mg nightly - Risk factor modification, hemoglobin A1c goal less than 7, lipid goal less than 70, medication adherence, diet, exercise, smoking cessation counseling performed - Telemetry monitoring;  30 day event monitor on discharge if no arrythmias captured  - Blood pressure goal             - Normotension, to be achieved  gradually over 3-5 days  - PT consult, OT consult, Speech consult, appreciate recommendation for CIR  - Outpatient neurology follow-up  # Bilateral lower extremity weakness -MRI C-spine, T-spine and L-spine, completed but nondiagnostic due to coughing when laying flat -Given his strength is improved today, reasonable to complete on an outpatient basis  # Impaired short-term memory  -B12 level 514 -Thiamine level ordered, pending -TSH elevated, free T4 0.87, normal -RPR  negative -HIV negative  Neurology will be available on an as-needed basis going forward, please reach out with new questions or concerns  Brooke Dare MD-PhD [facility] [phone]

## 2020-10-02 NOTE — Progress Notes (Signed)
Physical Therapy Treatment Patient Details Name: Miliano Cotten MRN: [id number] DOB: [birth year] Today's Date: 10/02/2020    History of Present Illness presented to ER secondary to 2-3 week history of unsteady gait, slurred speech and multiple falls; admitted for TIA/CVA work up.  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    PT Comments    Pt was pleasant and motivated to participate during the session.  Pt required extra time and effort with bed mobility tasks but no physical assistance.  Pt was generally steady in standing but did require occasional min A to prevent LOB during ambulation and standing balance training.  Pt reported no adverse symptoms during the session with SpO2 and HR WNL.  Pt will benefit from PT services in an IR setting upon discharge to safely address deficits listed in patient problem list for decreased caregiver assistance and eventual return to PLOF.     Follow Up Recommendations  CIR     Equipment Recommendations  Other (comment) (TBD at next venue of care)    Recommendations for Other Services       Precautions / Restrictions Precautions Precautions: Fall Precaution Comments: Watch HR Restrictions Weight Bearing Restrictions: No Other Position/Activity Restrictions: Permissive HTN (on 2/10 MD ok with pt participating with PT with BP 190/120) with plan to slowly bring BP down    Mobility  Bed Mobility Overal bed mobility: Needs Assistance Bed Mobility: Supine to Sit     Supine to sit: Supervision     General bed mobility comments: Extra time and effort but no physical assistance required    Transfers Overall transfer level: Needs assistance Equipment used: Rolling walker (2 wheeled) Transfers: Sit to/from Stand Sit to Stand: Min assist         General transfer comment: Min A for stability upon initial stand but then  CGA  Ambulation/Gait Ambulation/Gait assistance: Min assist Gait Distance (Feet): 80 Feet x 2 Assistive device: Rolling walker (2 wheeled) Gait Pattern/deviations: Step-through pattern;Decreased step length - right;Decreased step length - left Gait velocity: decreased   General Gait Details: Min A x 2 for stability during standing/ambulation with RW   Stairs             Wheelchair Mobility    Modified Rankin (Stroke Patients Only)       Balance Overall balance assessment: Needs assistance Sitting-balance support: No upper extremity supported;Feet supported Sitting balance-Leahy Scale: Fair     Standing balance support: Bilateral upper extremity supported;No upper extremity supported;During functional activity Standing balance-Leahy Scale: Poor Standing balance comment: Min A for stability on occasion during ambulation                            Cognition Arousal/Alertness: Awake/alert Behavior During Therapy: WFL for tasks assessed/performed Overall Cognitive Status: Within Functional Limits for tasks assessed                                        Exercises Total Joint Exercises Ankle Circles/Pumps: Strengthening;Both;10 reps (with manual resistance) Quad Sets: Strengthening;Both;10 reps Gluteal Sets: Strengthening;Both;10 reps Towel Squeeze: Strengthening;Both;10 reps Heel Slides: Strengthening;Both;10 reps Hip ABduction/ADduction: Strengthening;Both;10 reps Straight Leg Raises: Strengthening;Both;10 reps Long Arc Quad: Strengthening;Both;10 reps;15 reps Knee Flexion: Strengthening;Both;10 reps;15 reps Marching in Standing: AROM;Strengthening;Both;10 reps;Standing Other Exercises Other Exercises: Static and  dynamic standing balance training with and without UE support    General Comments        Pertinent Vitals/Pain Pain Assessment: No/denies pain    Home Living                      Prior Function             PT Goals (current goals can now be found in the care plan section) Progress towards PT goals: Progressing toward goals    Frequency    7X/week      PT Plan Current plan remains appropriate    Co-evaluation              AM-PAC PT "6 Clicks" Mobility   Outcome Measure  Help needed turning from your back to your side while in a flat bed without using bedrails?: None Help needed moving from lying on your back to sitting on the side of a flat bed without using bedrails?: A Little Help needed moving to and from a bed to a chair (including a wheelchair)?: A Little Help needed standing up from a chair using your arms (e.g., wheelchair or bedside chair)?: A Little Help needed to walk in hospital room?: A Little Help needed climbing 3-5 steps with a railing? : A Little 6 Click Score: 19    End of Session Equipment Utilized During Treatment: Gait belt Activity Tolerance: Patient tolerated treatment well Patient left: with call bell/phone within reach;in chair;with family/visitor present;with chair alarm set Nurse Communication: Mobility status PT Visit Diagnosis: Muscle weakness (generalized) (M62.81);Difficulty in walking, not elsewhere classified (R26.2);Hemiplegia and hemiparesis;Other symptoms and signs involving the nervous system (R29.898) Hemiplegia - Right/Left: Left Hemiplegia - dominant/non-dominant: Non-dominant Hemiplegia - caused by: Cerebral infarction     Time: 1039-1110 PT Time Calculation (min) (ACUTE ONLY): 31 min  Charges:  $Gait Training: 8-22 mins $Therapeutic Exercise: 8-22 mins                     D. Scott Izola Teague PT, DPT 10/02/20, 11:31 AM

## 2020-10-02 NOTE — Progress Notes (Signed)
MD seen during rounds, RN asked about PRN parameters due to pt have htn at this time. MD said to given the prn hydralazine and she will change the parameters.

## 2020-10-02 NOTE — Care Plan (Incomplete Revision)
Personally reviewed MRI spine.  Agree with radiology read that this is essentially nondiagnostic. If patient remains clinically stable, the study can be repeated when he is better able to tolerate laying flat without coughing.  Appreciate primary team's consideration of diagnostic thoracentesis.  We will hold off on Plavix to facilitate this study.  Given his young age, additionally will add hypercoagulability panel to his work-up.  Did not send factor V Leiden as I am looking more for arterial hypercoagulability, did not send Antithrombin III, but would consider this if homocystine returns high, did not send prothrombin 3 gene mutation as it is primarily seen in white patients.   Continue to gradually normalize blood pressure, no indication for permissive hypertension from a neurologic perspective.

## 2020-10-02 NOTE — Progress Notes (Signed)
Recheck pt's BP 178/100.BP is coming down. Pt in bed resting with eyes closed.

## 2020-10-02 NOTE — Care Plan (Signed)
Personally reviewed MRI spine.  Agree with radiology read that this is essentially nondiagnostic. If patient remains clinically stable, the study can be repeated on an outpatient basis when he is better able to tolerate laying flat without coughing.  No further inpatient neurological work-up at this time. Please reconsult neurology with any new questions or concerns.

## 2020-10-02 NOTE — Progress Notes (Signed)
OT Cancellation Note  Patient Details Name: Nagee Goates MRN: [id number] DOB: June 15, 1966   Cancelled Treatment:    Reason Eval/Treat Not Completed: Medical issues which prohibited therapy. BP checked in supine at 1342 with results of 201/104. OT to hold at this time. OT will follow up as time allows if pt is able to medically participate safely in OT intervention.   Jackquline Denmark, MS, OTR/L , CBIS ascom [phone]  10/02/20, 1:46 PM   10/02/2020, 1:44 PM

## 2020-10-02 NOTE — Progress Notes (Signed)
Inpatient Rehab Admissions Coordinator:   Note pt progressing well with therapy with implementation of RW.  I will not have a bed available for him through Monday.  I will f/u with him at that time if he remains in house.    Estill Dooms, PT, DPT Admissions Coordinator [phone] 10/02/20  10:38 AM

## 2020-10-03 DIAGNOSIS — I160 Hypertensive urgency: Secondary | ICD-10-CM | POA: Diagnosis not present

## 2020-10-03 DIAGNOSIS — J90 Pleural effusion, not elsewhere classified: Secondary | ICD-10-CM | POA: Diagnosis not present

## 2020-10-03 LAB — BASIC METABOLIC PANEL
Anion gap: 10 (ref 5–15)
BUN: 34 mg/dL — ABNORMAL HIGH (ref 6–20)
CO2: 23 mmol/L (ref 22–32)
Calcium: 7.8 mg/dL — ABNORMAL LOW (ref 8.9–10.3)
Chloride: 111 mmol/L (ref 98–111)
Creatinine, Ser: 2.07 mg/dL — ABNORMAL HIGH (ref 0.61–1.24)
GFR, Estimated: 37 mL/min — ABNORMAL LOW (ref >60)
Glucose, Bld: 102 mg/dL — ABNORMAL HIGH (ref 70–99)
Potassium: 4.1 mmol/L (ref 3.5–5.1)
Sodium: 144 mmol/L (ref 135–145)

## 2020-10-03 LAB — CBC
HCT: 39.9 % (ref 39.0–52.0)
Hemoglobin: 12.5 g/dL — ABNORMAL LOW (ref 13.0–17.0)
MCH: 26.8 pg (ref 26.0–34.0)
MCHC: 31.3 g/dL (ref 30.0–36.0)
MCV: 85.4 fL (ref 80.0–100.0)
Platelets: 190 10*3/uL (ref 150–400)
RBC: 4.67 MIL/uL (ref 4.22–5.81)
RDW: 16.6 % — ABNORMAL HIGH (ref 11.5–15.5)
WBC: 5.4 10*3/uL (ref 4.0–10.5)
nRBC: 0 % (ref 0.0–0.2)

## 2020-10-03 LAB — GLUCOSE, CAPILLARY
Glucose-Capillary: 91 mg/dL (ref 70–99)
Glucose-Capillary: 136 mg/dL — ABNORMAL HIGH (ref 70–99)
Glucose-Capillary: 166 mg/dL — ABNORMAL HIGH (ref 70–99)
Glucose-Capillary: 102 mg/dL — ABNORMAL HIGH (ref 70–99)

## 2020-10-03 LAB — BETA-2-GLYCOPROTEIN I ABS, IGG/M/A
Beta-2 Glyco I IgG: <9 GPI IgG units (ref 0–20)
Beta-2-Glycoprotein I IgA: <9 GPI IgA units (ref 0–25)
Beta-2-Glycoprotein I IgM: <9 GPI IgM units (ref 0–32)

## 2020-10-03 LAB — PROTEIN S, TOTAL: Protein S Ag, Total: 132 % (ref 60–150)

## 2020-10-03 LAB — LUPUS ANTICOAGULANT PANEL
DRVVT: 37.8 s (ref 0.0–47.0)
PTT Lupus Anticoagulant: 39.2 s (ref 0.0–51.9)

## 2020-10-03 LAB — PROTEIN C ACTIVITY: Protein C Activity: 120 % (ref 73–180)

## 2020-10-03 LAB — CARDIOLIPIN ANTIBODIES, IGG, IGM, IGA
Anticardiolipin IgA: <9 APL U/mL (ref 0–11)
Anticardiolipin IgG: 12 GPL U/mL (ref 0–14)
Anticardiolipin IgM: <9 MPL U/mL (ref 0–12)

## 2020-10-03 LAB — PROTEIN S ACTIVITY: Protein S Activity: 117 % (ref 63–140)

## 2020-10-03 LAB — HOMOCYSTEINE: Homocysteine: 16.2 umol/L — ABNORMAL HIGH (ref 0.0–14.5)

## 2020-10-03 MED ORDER — HYDRALAZINE HCL 50 MG PO TABS
75.0000 mg | ORAL_TABLET | Freq: Three times a day (TID) | ORAL | Status: DC
  Administered 2020-10-03 – 2020-10-04 (×4): 75 mg via ORAL
  Filled 2020-10-03 (×4): qty 1

## 2020-10-03 MED ORDER — AMLODIPINE BESYLATE 10 MG PO TABS
10.0000 mg | ORAL_TABLET | Freq: Every day | ORAL | Status: DC
Start: 2020-10-03 — End: 2020-10-06
  Administered 2020-10-03 – 2020-10-06 (×4): 10 mg via ORAL
  Filled 2020-10-03 (×4): qty 1

## 2020-10-03 NOTE — Progress Notes (Signed)
Physical Therapy Treatment Patient Details Name: Tommy Perez MRN: [id number] DOB: 1966-03-09 Today's Date: 10/03/2020    History of Present Illness presented to ER secondary to 2-3 week history of unsteady gait, slurred speech and multiple falls; admitted for TIA/CVA work up.  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    PT Comments    Pt was supine in bed with Hob elevated ~ 30 degrees upon arriving. He agrees to PT session and is extremely motivated and pleasant. BP in safe parameter for participation in PT. He was able to exit L side of bed without physical assistance. Stood and ambulated with min assist. Pt required constant vcs for safety awareness and increased BOS during ambulation. Required intervention two times during gait training to prevent fall. Pt would greatly benefit from CIR at DC to address deficits while assisting pt to PLOF.He was sitting in recliner with call bell in reach and chair alarm in place at conclusion of session.    Follow Up Recommendations  CIR     Equipment Recommendations  Other (comment) (defer tio next level of care)    Recommendations for Other Services       Precautions / Restrictions Precautions Precautions: Fall Precaution Comments: Watch HR Restrictions Weight Bearing Restrictions: No    Mobility  Bed Mobility Overal bed mobility: Needs Assistance Bed Mobility: Supine to Sit     Supine to sit: Supervision     General bed mobility comments: Pt was able to exit L side of bed withoutphysical assistance    Transfers Overall transfer level: Needs assistance Equipment used: Rolling walker (2 wheeled) Transfers: Sit to/from Stand Sit to Stand: Min assist         General transfer comment: Vcs for improved technique and safety. min assist to achieve full upright standing with BUE support on  RW  Ambulation/Gait Ambulation/Gait assistance: Min assist Gait Distance (Feet): 200 Feet Assistive device: Rolling walker (2 wheeled) Gait Pattern/deviations: Narrow base of support;Step-through pattern;Decreased step length - right;Decreased weight shift to right;Decreased weight shift to left Gait velocity: decreased   General Gait Details: Pt was able to ambulated 200 ft from room 131 to 157 per charge RN request. He has several occasions of narrow BOS with 2 episodes of intervention to prevent fall. Overall tolerated well endurance wise      Balance Overall balance assessment: Needs assistance Sitting-balance support: No upper extremity supported;Feet supported Sitting balance-Leahy Scale: Fair     Standing balance support: Bilateral upper extremity supported;No upper extremity supported;During functional activity Standing balance-Leahy Scale: Fair Standing balance comment: static= good dynamic=poor         Cognition Arousal/Alertness: Awake/alert Behavior During Therapy: WFL for tasks assessed/performed Overall Cognitive Status: Within Functional Limits for tasks assessed          General Comments: Pt was A and O x 3. Agrees to session and cooperative throughout. BP is stable range to participate.             Pertinent Vitals/Pain Pain Assessment: No/denies pain           PT Goals (current goals can now be found in the care plan section) Acute Rehab PT Goals Patient Stated Goal: Agrees toi rehab prior to going home Progress towards PT goals: Progressing toward goals    Frequency    7X/week      PT Plan Current plan remains appropriate  AM-PAC PT "6 Clicks" Mobility   Outcome Measure  Help needed turning from your back to your side while in a flat bed without using bedrails?: None Help needed moving from lying on your back to sitting on the side of a flat bed without using bedrails?: A Little Help needed moving to and from a bed to a chair  (including a wheelchair)?: A Little Help needed standing up from a chair using your arms (e.g., wheelchair or bedside chair)?: A Little Help needed to walk in hospital room?: A Little Help needed climbing 3-5 steps with a railing? : A Little 6 Click Score: 19    End of Session Equipment Utilized During Treatment: Gait belt Activity Tolerance: Patient tolerated treatment well Patient left: with call bell/phone within reach;in chair;with family/visitor present;with chair alarm set Nurse Communication: Mobility status PT Visit Diagnosis: Muscle weakness (generalized) (M62.81);Difficulty in walking, not elsewhere classified (R26.2);Hemiplegia and hemiparesis;Other symptoms and signs involving the nervous system (R29.898) Hemiplegia - Right/Left: Left Hemiplegia - dominant/non-dominant: Non-dominant Hemiplegia - caused by: Cerebral infarction     Time: 2355-7322 PT Time Calculation (min) (ACUTE ONLY): 25 min  Charges:  $Gait Training: 8-22 mins $Neuromuscular Re-education: 8-22 mins                     Jetta Lout PTA 10/03/20, 1:16 PM

## 2020-10-03 NOTE — Progress Notes (Signed)
   10/02/20 0014  Assess: MEWS Score  Temp 97.7 F (36.5 C)  BP (!) 203/116  Pulse Rate 91  Resp 20  SpO2 98 %  O2 Device Room Air  Assess: MEWS Score  MEWS Temp 0  MEWS Systolic 2  MEWS Pulse 0  MEWS RR 0  MEWS LOC 0  MEWS Score 2  MEWS Score Color Yellow  Assess: if the MEWS score is Yellow or Red  Were vital signs taken at a resting state? Yes  Focused Assessment No change from prior assessment  Early Detection of Sepsis Score *See Row Information* Low  MEWS guidelines implemented *See Row Information* Yes  Treat  MEWS Interventions Administered scheduled meds/treatments  Pain Scale 0-10  Pain Score 1  Pain Type Acute pain  Take Vital Signs  Increase Vital Sign Frequency  Yellow: Q 2hr X 2 then Q 4hr X 2, if remains yellow, continue Q 4hrs  Notify: Charge Nurse/RN  Name of Charge Nurse/RN Notified Charlett Lango RN  Date Charge Nurse/RN Notified 10/02/20  Time Charge Nurse/RN Notified 0028  Notify: Provider  Provider Name/Title Manuela Schwartz NP  Date Provider Notified 10/02/20  Time Provider Notified 0040  Notification Type Page  Notification Reason Change in status  Document  Patient Outcome Not stable and remains on department  Progress note created (see row info) Yes  Entered for [employer]

## 2020-10-03 NOTE — Progress Notes (Addendum)
PROGRESS NOTE    Tommy Perez   NVB:[id number]  DOB: [birth year]  PCP: Patient, No Pcp Per    DOA: 09/29/2020 LOS: 3   Brief Narrative   55 year old male with past medical history of hypertension, diabetes mellitus type 2, nicotine dependence and medication noncompliance who presented to the ED on 09/29/20 with complaints of unsteady gait with frequent falls and slurred speech.  Symptoms had started 2-3 weeks prior but patient did not seek any medical attention.  MRI brain showed an area of restricted diffusion in the right anterior angulate gyrus.      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  MRI as above with acute/subacute stroke in addition to multiple prior/remote infarcts in bilateral cerebral hemispheres, L thalamus, b/l centrum semiovale -- ? cardioembolic etiology of strokes. 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  MBSS on 2/10  Short  term memory impairment - follow up pending thiamine level.  TSH up but free T4 normal.  B12 level 514. RPR and HIV negative.    Hypertensive urgency - initially allowed for permissive HTN, okay to start slowly treating at this point.  Is getting IV Lasix BID.   --Continue hydralazine 50>>75 mg TID.   --Continue amlodipine 10 mg daily --PRN oral hydralazine.  Acute diastolic CHF / HFpEF - presented with elevated BNP 1264 and bilateral pleural effusions.  Mild LVH on Echo.  CHF most likely due to longstanding uncontrolled HTN.   Echo EF 55-60%, grade 1 diastolic dfx, mild LVH Net IO Since Admission: -622.86 mL [10/03/20 2015] --Stop IV Lasix with rise in Cr --Monitor I/O's, daily weights --Monitor renal function and electrolytes  Complicated UTI - POA.  UA with pyuria and leuk-esterase.   Started on empiric antibiotics on admission.  Completing abx today (2/12)   AKI superimposed on CKD stage IIIa - POA with Cr 1.72 >> 2.17 after diuresis.  Baseline unclear but long history uncontrolled HTN and diabetes makes CKD IIIa most likely estimated baseline. AKI suspected multifactorial due to reduced PO intake since stroke, UTI - treated as above. --Off IV fluids  --daily BMP  Hypernatremia - Resolved with D5-1/2NS fluids. Monitor BMP.  Due to poor PO intake due to above.  Off IV fluids to monitor if adequate PO intake.  Type 2 diabetes mellitus - sliding scale Novolog.  Follow up A1c.   Pleural effusion, bilateral - suspect due to hypertensive / diastolic heart failure.  Started on IV lasix on admission, but need to stop due to worsened renal function.  Monitor respiratory status.  Other mgmt as above. --US guided thoracentesis for diagnostic/therapeutic - Mon --Fluid studies ordered  Elevated troponin due to demand ischemia - mildly elevated and flat trend, no chest pain or acute ischemic changes on EKG. Consistent with demand ischemia in setting of hypertensive urgency / CHF.    Nicotine  dependence, cigarettes, uncomplicated - nicotine patch ordered per pt request.  Counseled on importance of cessation.    DVT prophylaxis: SCD's Start: 09/29/20 2226   Diet:  Diet Orders (From admission, onward)    Start     Ordered   10/03/20 1142  DIET DYS 3 Room service appropriate? Yes; Fluid consistency: Thin  Diet effective now       Question Answer Comment  Room service appropriate? Yes   Fluid consistency: Thin      10/03/20 1142            Code Status: Full Code    Subjective 10/03/20    Patient seen up in chair today. Reports he feels well.  Speech he reports improving, strength as well.  No acute complaints.  We discussed thoracentesis Monday and working to bring his BP down carefully. He is agreeable.  Amenable to CIR and understands it will benefit him long term.     Disposition Plan & Communication   Status is: Inpatient  Inpatient status remains appropriate due to ongoing diagnostic evaluation not appropriate for outpatient setting, uncontrolled BP titrating medications, and pending CIR consult for dispo.  Dispo: The patient is from: Home              Anticipated d/c is to: CIR              Anticipated d/c date is: 2-3 days              Patient currently is not medically stable for d/c.   Difficult to place patient: No   Family Communication: none at bedside, will attempt to call   Consults, Procedures, Significant Events   Consultants:   Neurology  Procedures:   None  Antimicrobials:  Anti-infectives (From admission, onward)   Start     Dose/Rate Route Frequency Ordered Stop   09/29/20 2100  cefTRIAXone (ROCEPHIN) 1 g in sodium chloride 0.9 % 100 mL IVPB        1 g 200 mL/hr over 30 Minutes Intravenous Every 24 hours 09/29/20 2015 10/04/20 2059        Objective   Vitals:   10/03/20 0208 10/03/20 0400 10/03/20 0745 10/03/20 1533  BP: (!) 191/109 (!) 186/106 (!) 188/106 (!) 165/95  Pulse: 89 86 84 79  Resp: 17 17 16 16   Temp: 97.6 F  (36.4 C) 97.7 F (36.5 C)    TempSrc:      SpO2: 100% 98% 100% 98%  Weight:      Height:        Intake/Output Summary (Last 24 hours) at 10/03/2020 2015 Last data filed at 10/03/2020 1857 Gross per 24 hour  Intake 1688.15 ml  Output 2150 ml  Net -461.85 ml   Filed Weights   09/29/20 1410  Weight: 77.1 kg    Physical Exam:  General exam: awake, alert, no acute distress Respiratory system: symmetric chest rise, normal respiratory effort. Cardiovascular system: RRR, no pedal edema.   Central nervous system: dysarthric speech improved, b/l lateral gaze stable, no new gross focal deficits Skin: dry, intact, normal temperature  Labs   Data Reviewed: I have personally reviewed following labs and imaging studies  CBC:  Recent Labs  Lab 09/29/20 1435 10/03/20 0526  WBC 6.6 5.4  HGB 13.3 12.5*  HCT 44.3 39.9  MCV 89.3 85.4  PLT 191 190   Basic Metabolic Panel: Recent Labs  Lab 09/29/20 1435 10/01/20 0507 10/02/20 0513 10/03/20 0526  NA 146* 147* 143 144  K 4.6 4.6 4.3 4.1  CL 115* 111 108 111  CO2 21* 26 24 23   GLUCOSE 108* 78 113* 102*  BUN 29* 35* 37* 34*  CREATININE 1.72* 2.17* 2.26* 2.07*  CALCIUM 7.9* 8.0* 7.8* 7.8*  MG  --  2.2  --   --    GFR: Estimated Creatinine Clearance: 42.9 mL/min (A) (by C-G formula based on SCr of 2.07 mg/dL (H)). Liver Function Tests: Recent Labs  Lab 09/29/20 1435  AST 28  ALT 38  ALKPHOS 127*  BILITOT 0.4  PROT 6.9  ALBUMIN 2.5*   No results for input(s): LIPASE, AMYLASE in the last 168 hours. No results for input(s): AMMONIA in the last 168 hours. Coagulation Profile: No results for input(s): INR, PROTIME in the last 168 hours. Cardiac Enzymes: No results for input(s): CKTOTAL, CKMB, CKMBINDEX, TROPONINI in the last 168 hours. BNP (last 3 results) No results for input(s): PROBNP in the last 8760 hours. HbA1C: No results for input(s): HGBA1C in the last 72 hours. CBG: Recent Labs  Lab 10/02/20 1734  10/02/20 2137 10/03/20 0750 10/03/20 1305 10/03/20 1714  GLUCAP 117* 155* 91 136* 166*   Lipid Profile: No results for input(s): CHOL, HDL, LDLCALC, TRIG, CHOLHDL, LDLDIRECT in the last 72 hours. Thyroid Function Tests: Recent Labs    10/01/20 0507 10/01/20 1105  TSH  --  5.877*  FREET4 0.87  --    Anemia Panel: Recent Labs    10/01/20 1105  VITAMINB12 574   Sepsis Labs: Recent Labs  Lab 09/29/20 1841  PROCALCITON <0.10    Recent Results (from the past 240 hour(s))  Resp Panel by RT-PCR (Flu A&B, Covid) Nasopharyngeal Swab     Status: None   Collection Time: 09/29/20  5:00 PM   Specimen: Nasopharyngeal Swab; Nasopharyngeal(NP) swabs in vial transport medium  Result Value Ref Range Status   SARS Coronavirus 2 by RT PCR NEGATIVE NEGATIVE Final    Comment: (NOTE) SARS-CoV-2 target nucleic acids are NOT DETECTED.  The SARS-CoV-2 RNA is generally detectable in upper respiratory specimens during the acute phase of infection. The lowest concentration of SARS-CoV-2 viral copies this assay can detect is 138 copies/mL. A negative result does not preclude SARS-Cov-2 infection and should not be used as the sole basis for treatment or other patient management decisions. A negative result may occur with  improper specimen collection/handling, submission of specimen other than nasopharyngeal swab, presence of viral mutation(s) within the areas targeted by this assay, and inadequate number of viral copies(<138 copies/mL). A negative result must be combined with clinical observations, patient history, and epidemiological information. The expected result is Negative.  Fact Sheet for Patients:  11/27/20  Fact Sheet for Healthcare Providers:  BloggerCourse.com  This test is no t yet approved or cleared by the [employer] FDA and  has been authorized for detection and/or diagnosis of SARS-CoV-2 by FDA under an Emergency  Use Authorization (EUA). This EUA will remain  in effect (meaning this test can be used) for the duration of the COVID-19 declaration under Section 564(b)(1) of the Act, 21 U.S.C.section 360bbb-3(b)(1), unless the authorization is terminated  or revoked sooner.       Influenza A by  PCR NEGATIVE NEGATIVE Final   Influenza B by PCR NEGATIVE NEGATIVE Final    Comment: (NOTE) The Xpert Xpress SARS-CoV-2/FLU/RSV plus assay is intended as an aid in the diagnosis of influenza from Nasopharyngeal swab specimens and should not be used as a sole basis for treatment. Nasal washings and aspirates are unacceptable for Xpert Xpress SARS-CoV-2/FLU/RSV testing.  Fact Sheet for Patients: BloggerCourse.com  Fact Sheet for Healthcare Providers: SeriousBroker.it  This test is not yet approved or cleared by the [place] FDA and has been authorized for detection and/or diagnosis of SARS-CoV-2 by FDA under an Emergency Use Authorization (EUA). This EUA will remain in effect (meaning this test can be used) for the duration of the COVID-19 declaration under Section 564(b)(1) of the Act, 21 U.S.C. section 360bbb-3(b)(1), unless the authorization is terminated or revoked.  Performed at [facility], [address] [place], [place] [address]   Culture, blood (Routine X 2) w Reflex to ID Panel     Status: None (Preliminary result)   Collection Time: 09/29/20  6:41 PM   Specimen: BLOOD  Result Value Ref Range Status   Specimen Description BLOOD BLOOD RIGHT HAND  Final   Special Requests   Final    BOTTLES DRAWN AEROBIC AND ANAEROBIC Blood Culture results may not be optimal due to an inadequate volume of blood received in culture bottles   Culture   Final    NO GROWTH 4 DAYS Performed at [facility], [address], [place], [place] [address]    Report Status PENDING  Incomplete  Culture, blood (Routine X 2) w Reflex to ID  Panel     Status: None (Preliminary result)   Collection Time: 09/29/20  6:53 PM   Specimen: BLOOD  Result Value Ref Range Status   Specimen Description BLOOD BLOOD RIGHT FOREARM  Final   Special Requests   Final    BOTTLES DRAWN AEROBIC AND ANAEROBIC Blood Culture adequate volume   Culture   Final    NO GROWTH 4 DAYS Performed at [facility], [address], [place], [place] [address]    Report Status PENDING  Incomplete  Urine Culture     Status: Abnormal   Collection Time: 09/30/20  1:18 AM   Specimen: Urine, Random  Result Value Ref Range Status   Specimen Description   Final    URINE, RANDOM Performed at [facility], [address] [place], [place] [address]    Special Requests   Final    NONE Performed at [facility], [address] [place], [place] [address]    Culture (A)  Final    <10,000 COLONIES/mL INSIGNIFICANT GROWTH Performed at [facility], [address] [place], [place] [address]    Report Status 10/01/2020 FINAL  Final      Imaging Studies   No results found.   Medications   Scheduled Meds: . amLODipine  10 mg Oral Daily  . aspirin EC  81 mg Oral Daily  . atorvastatin  40 mg Oral QHS  . hydrALAZINE  75 mg Oral TID  . hydrocerin   Topical BID  . insulin aspart  0-15 Units Subcutaneous TID AC & HS  . nicotine  14 mg Transdermal Daily   Continuous Infusions: . cefTRIAXone (ROCEPHIN)  IV Stopped (10/02/20 2222)       LOS: 3 days    Time spent: 25 minutes with >50% spent at bedside and in coordination of care.    Tresa Endo  Gean Birchwood, DO [facility]  10/03/2020, 8:15 PM      If 7PM-7AM, please contact night-coverage. How to contact the [facility] Attending or Consulting provider 7A - 7P or covering provider during after hours 7P -7A, for this patient?    1. Check the care team in [facility] and look for a) attending/consulting TRH provider listed and b) the [facility] team listed 2. Log into [email or web address]  and use [place]'s universal password to access. If you do not have the password, please contact the hospital operator. 3. Locate the [facility] provider you are looking for under [facility] and page to a number that you can be directly reached. 4. If you still have difficulty reaching the provider, please page the [facility] (Director on Call) for the Hospitalists listed on amion for assistance.

## 2020-10-03 NOTE — Progress Notes (Signed)
  Speech Language Pathology Treatment: Dysphagia  Patient Details Name: Tommy Perez MRN: [id number] DOB: 19-Dec-1965 Today's Date: 10/03/2020 Time: 5053-9767 SLP Time Calculation (min) (ACUTE ONLY): 14 min  Assessment / Plan / Recommendation Clinical Impression  Skilled treatment session focused on pt's ongoing dysphagia goals. Of note, pt's respiratory status appears improved as well as his dysarthria. Skilled observation provided of pt consuming thin liquids via cup and straw x 12 oz. Pt was free of any overt s/s of aspiration even with large consecutive boluses. Extensive education provided on strict aspiration precautions with pt able to demonstrate understanding. Instruction included small sips/boluses, intermittent throat clears and sitting upright during PO consumption. When following strict aspiration precautions, pt appears appropriate for upgrade to thin liquids via cup or straw.     HPI HPI: 55 y.o. male who presented to ED with 2-3 week history of unsteady gait, slurred speech and multiple falls. MRI showed acute/subacute R cingulate gyrus infarct, remote bilateral cerebellar, R pons, L thalamus and bilateral centrum semiovale infarcts. Chest CT 09/29/20 showed bilateral pleural effusions, R>L, scattered bilateral ground-glass airspace disease greatest in RUL, dense R middle and R lower lobe consolidation, mucoid material within R mainstem brochus and bronchus intermedius.      SLP Plan   (dysphagia goals met, dysarthria much improved, will defer further cognitive therapy to next venue of care)       Recommendations  Diet recommendations: Dysphagia 3 (mechanical soft);Thin liquid Liquids provided via: Straw Medication Administration: Whole meds with puree Supervision: Patient able to self feed Compensations: Minimize environmental distractions;Slow rate;Small sips/bites Postural Changes and/or Swallow Maneuvers: Seated upright 90 degrees                Oral Care  Recommendations: Oral care BID Follow up Recommendations: Inpatient Rehab SLP Visit Diagnosis: Dysphagia, oropharyngeal phase (R13.12) Plan:  (dysphagia goals met, dysarthria much improved, will defer further cognitive therapy to next venue of care)       GO               Kohana Amble B. Rutherford Nail M.S., CCC-SLP, [place] Office [phone]  Gianlucas Evenson Rutherford Nail 10/03/2020, 1:16 PM

## 2020-10-04 DIAGNOSIS — E1122 Type 2 diabetes mellitus with diabetic chronic kidney disease: Secondary | ICD-10-CM | POA: Diagnosis not present

## 2020-10-04 DIAGNOSIS — N1831 Chronic kidney disease, stage 3a: Secondary | ICD-10-CM | POA: Diagnosis not present

## 2020-10-04 DIAGNOSIS — J90 Pleural effusion, not elsewhere classified: Secondary | ICD-10-CM | POA: Diagnosis not present

## 2020-10-04 DIAGNOSIS — I160 Hypertensive urgency: Secondary | ICD-10-CM | POA: Diagnosis not present

## 2020-10-04 LAB — CULTURE, BLOOD (ROUTINE X 2)
Culture: NO GROWTH
Culture: NO GROWTH
Special Requests: ADEQUATE

## 2020-10-04 LAB — GLUCOSE, CAPILLARY
Glucose-Capillary: 76 mg/dL (ref 70–99)
Glucose-Capillary: 69 mg/dL — ABNORMAL LOW (ref 70–99)
Glucose-Capillary: 99 mg/dL (ref 70–99)
Glucose-Capillary: 103 mg/dL — ABNORMAL HIGH (ref 70–99)

## 2020-10-04 LAB — BASIC METABOLIC PANEL
Anion gap: 9 (ref 5–15)
BUN: 33 mg/dL — ABNORMAL HIGH (ref 6–20)
CO2: 22 mmol/L (ref 22–32)
Calcium: 7.6 mg/dL — ABNORMAL LOW (ref 8.9–10.3)
Chloride: 111 mmol/L (ref 98–111)
Creatinine, Ser: 1.98 mg/dL — ABNORMAL HIGH (ref 0.61–1.24)
GFR, Estimated: 39 mL/min — ABNORMAL LOW (ref >60)
Glucose, Bld: 60 mg/dL — ABNORMAL LOW (ref 70–99)
Potassium: 4.3 mmol/L (ref 3.5–5.1)
Sodium: 142 mmol/L (ref 135–145)

## 2020-10-04 MED ORDER — INSULIN ASPART 100 UNIT/ML ~~LOC~~ SOLN
0.0000 [IU] | Freq: Three times a day (TID) | SUBCUTANEOUS | Status: DC
  Administered 2020-10-05 (×2): 2 [IU] via SUBCUTANEOUS
  Filled 2020-10-04 (×2): qty 1

## 2020-10-04 MED ORDER — HYDRALAZINE HCL 50 MG PO TABS
100.0000 mg | ORAL_TABLET | Freq: Three times a day (TID) | ORAL | Status: DC
  Administered 2020-10-04 – 2020-10-06 (×6): 100 mg via ORAL
  Filled 2020-10-04 (×6): qty 2

## 2020-10-04 MED ORDER — CARVEDILOL 12.5 MG PO TABS
12.5000 mg | ORAL_TABLET | Freq: Two times a day (BID) | ORAL | Status: DC
  Administered 2020-10-04 – 2020-10-06 (×4): 12.5 mg via ORAL
  Filled 2020-10-04 (×4): qty 1

## 2020-10-04 NOTE — Progress Notes (Signed)
PROGRESS NOTE    Tommy Perez   NFA:[id number]  DOB: 1966-05-01  PCP: Patient, No Pcp Per    DOA: 09/29/2020 LOS: 4   Brief Narrative   55 year old male with past medical history of hypertension, diabetes mellitus type 2, nicotine dependence and medication noncompliance who presented to the ED on 09/29/20 with complaints of unsteady gait with frequent falls and slurred speech.  Symptoms had started 2-3 weeks prior but patient did not seek any medical attention.  MRI brain showed an area of restricted diffusion in the right anterior angulate gyrus.      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  MRI as above with acute/subacute stroke in addition to multiple prior/remote infarcts in bilateral cerebral hemispheres, L thalamus, b/l centrum semiovale -- ? cardioembolic etiology of strokes. 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  MBSS on 2/10, see  report.  On dysphagia-3 diet and seems tolerating well.  Short term memory impairment - follow up pending thiamine level.  TSH up but free T4 normal.  B12 level 514. RPR and HIV negative.    Hypertensive urgency - initially allowed for permissive HTN, okay to start slowly treating at this point.  Is getting IV Lasix BID.   --Continue hydralazine 75>>100 mg TID --Continue amlodipine 10 mg daily --Add Coreg 12.5 mg BID --PRN oral hydralazine.  Acute diastolic CHF / HFpEF - presented with elevated BNP 1264 and bilateral pleural effusions.  Mild LVH on Echo.  CHF most likely due to longstanding uncontrolled HTN.   Echo EF 55-60%, grade 1 diastolic dfx, mild LVH Net IO Since Admission: -2,172.86 mL [10/04/20 2144] --Continue to hold diuresis, pt seems euvolemic --Monitor I/O's, daily weights --Monitor renal function and electrolytes  Complicated UTI - POA.  UA with pyuria and leuk-esterase.   Started on empiric antibiotics on admission.  Completed course.  AKI superimposed on CKD stage IIIa - POA with Cr 1.72 >> 2.17 after diuresis.  Baseline unclear but long history uncontrolled HTN and diabetes makes CKD IIIa most likely estimated baseline. AKI suspected multifactorial due to reduced PO intake since stroke, UTI - treated as above. --Off IV fluids  --daily BMP  Hypernatremia - Resolved with D5-1/2NS fluids. Monitor BMP.  Due to poor PO intake due to above.  Off IV fluids to monitor if adequate PO intake.  Type 2 diabetes mellitus - sliding scale Novolog.  Follow up A1c.   Pleural effusion, bilateral - suspect due to hypertensive / diastolic heart failure.  Started on IV lasix on admission, but stopped due to  worsened renal function.  Monitor respiratory status.  Other mgmt as above. --Thoracentesis for diagnostic/therapeutic - hopefully Mon --Fluid studies ordered  Elevated troponin due to demand ischemia - mildly elevated and flat trend, no chest pain or acute ischemic changes on EKG.  Consistent with demand ischemia in setting of hypertensive urgency / CHF.    Nicotine dependence, cigarettes, uncomplicated - nicotine patch ordered per pt request.  Counseled on importance of cessation.    DVT prophylaxis: SCD's Start: 09/29/20 2226   Diet:  Diet Orders (From admission, onward)    Start     Ordered   10/03/20 1142  DIET DYS 3 Room service appropriate? Yes; Fluid consistency: Thin  Diet effective now       Question Answer Comment  Room service appropriate? Yes   Fluid consistency: Thin      10/03/20 1142            Code Status: Full Code    Subjective 10/04/20    Patient seen up in chair again today.  Says he feels well.  No acute complaints.  Going to watch the Superbowl this evening.   Disposition Plan & Communication   Status is: Inpatient  Inpatient status remains appropriate due to ongoing diagnostic evaluation not appropriate for outpatient setting, uncontrolled BP titrating medications, pending procedure and pending CIR bed/acceptance.  Dispo: The patient is from: Home              Anticipated d/c is to: CIR              Anticipated d/c date is: 1-2 days              Patient currently is not medically stable for d/c.   Difficult to place patient: No   Family Communication: none at bedside, will attempt to call   Consults, Procedures, Significant Events   Consultants:   Neurology  Procedures:   None  Antimicrobials:  Anti-infectives (From admission, onward)   Start     Dose/Rate Route Frequency Ordered Stop   09/29/20 2100  cefTRIAXone (ROCEPHIN) 1 g in sodium chloride 0.9 % 100 mL IVPB        1 g 200 mL/hr over 30 Minutes Intravenous Every 24 hours 09/29/20 2015 10/04/20 0743        Objective   Vitals:   10/04/20 0736 10/04/20 1123 10/04/20 1559 10/04/20 2045  BP: (!) 186/99 (!) 175/99 139/86 (!) 144/91  Pulse: 92 91 80 73  Resp: 18 18 16 17   Temp: 98.6 F (37 C) 98.4 F (36.9 C) 99.4 F (37.4 C)   TempSrc:       SpO2: 97% 99% 99% 100%  Weight:      Height:        Intake/Output Summary (Last 24 hours) at 10/04/2020 2144 Last data filed at 10/04/2020 1000 Gross per 24 hour  Intake --  Output 1550 ml  Net -1550 ml   Filed Weights   09/29/20 1410  Weight: 77.1 kg    Physical Exam:  General exam: awake, alert, no acute distress Respiratory system: CTAB no wheezes or rhonchi, symmetric chest rise, normal respiratory effort. Cardiovascular system: RRR, no pedal edema.   Central nervous system: dysarthric speech but improved, b/l lateral gaze stable, no new gross focal deficits   Labs   Data Reviewed: I have personally reviewed following labs and imaging studies  CBC: Recent Labs  Lab 09/29/20 1435 10/03/20 0526  WBC 6.6 5.4  HGB 13.3 12.5*  HCT  44.3 39.9  MCV 89.3 85.4  PLT 191 190   Basic Metabolic Panel: Recent Labs  Lab 09/29/20 1435 10/01/20 0507 10/02/20 0513 10/03/20 0526 10/04/20 0516  NA 146* 147* 143 144 142  K 4.6 4.6 4.3 4.1 4.3  CL 115* 111 108 111 111  CO2 21* 26 24 23 22   GLUCOSE 108* 78 113* 102* 60*  BUN 29* 35* 37* 34* 33*  CREATININE 1.72* 2.17* 2.26* 2.07* 1.98*  CALCIUM 7.9* 8.0* 7.8* 7.8* 7.6*  MG  --  2.2  --   --   --    GFR: Estimated Creatinine Clearance: 44.9 mL/min (A) (by C-G formula based on SCr of 1.98 mg/dL (H)). Liver Function Tests: Recent Labs  Lab 09/29/20 1435  AST 28  ALT 38  ALKPHOS 127*  BILITOT 0.4  PROT 6.9  ALBUMIN 2.5*   No results for input(s): LIPASE, AMYLASE in the last 168 hours. No results for input(s): AMMONIA in the last 168 hours. Coagulation Profile: No results for input(s): INR, PROTIME in the last 168 hours. Cardiac Enzymes: No results for input(s): CKTOTAL, CKMB, CKMBINDEX, TROPONINI in the last 168 hours. BNP (last 3 results) No results for input(s): PROBNP in the last 8760 hours. HbA1C: No results for input(s): HGBA1C in the last 72 hours. CBG: Recent Labs  Lab 10/03/20 1714 10/03/20 2203  10/04/20 0736 10/04/20 1135 10/04/20 1635  GLUCAP 166* 102* 76 69* 99   Lipid Profile: No results for input(s): CHOL, HDL, LDLCALC, TRIG, CHOLHDL, LDLDIRECT in the last 72 hours. Thyroid Function Tests: No results for input(s): TSH, T4TOTAL, FREET4, T3FREE, THYROIDAB in the last 72 hours. Anemia Panel: No results for input(s): VITAMINB12, FOLATE, FERRITIN, TIBC, IRON, RETICCTPCT in the last 72 hours. Sepsis Labs: Recent Labs  Lab 09/29/20 1841  PROCALCITON <0.10    Recent Results (from the past 240 hour(s))  Resp Panel by RT-PCR (Flu A&B, Covid) Nasopharyngeal Swab     Status: None   Collection Time: 09/29/20  5:00 PM   Specimen: Nasopharyngeal Swab; Nasopharyngeal(NP) swabs in vial transport medium  Result Value Ref Range Status   SARS Coronavirus 2 by RT PCR NEGATIVE NEGATIVE Final    Comment: (NOTE) SARS-CoV-2 target nucleic acids are NOT DETECTED.  The SARS-CoV-2 RNA is generally detectable in upper respiratory specimens during the acute phase of infection. The lowest concentration of SARS-CoV-2 viral copies this assay can detect is 138 copies/mL. A negative result does not preclude SARS-Cov-2 infection and should not be used as the sole basis for treatment or other patient management decisions. A negative result may occur with  improper specimen collection/handling, submission of specimen other than nasopharyngeal swab, presence of viral mutation(s) within the areas targeted by this assay, and inadequate number of viral copies(<138 copies/mL). A negative result must be combined with clinical observations, patient history, and epidemiological information. The expected result is Negative.  Fact Sheet for Patients:  11/27/20  Fact Sheet for Healthcare Providers:  BloggerCourse.com  This test is no t yet approved or cleared by the [place] FDA and  has been authorized for detection and/or diagnosis of  SARS-CoV-2 by FDA under an Emergency Use Authorization (EUA). This EUA will remain  in effect (meaning this test can be used) for the duration of the COVID-19 declaration under Section 564(b)(1) of the Act, 21 U.S.C.section 360bbb-3(b)(1), unless the authorization is terminated  or revoked sooner.       Influenza A by PCR NEGATIVE NEGATIVE Final   Influenza B by PCR NEGATIVE  NEGATIVE Final    Comment: (NOTE) The Xpert Xpress SARS-CoV-2/FLU/RSV plus assay is intended as an aid in the diagnosis of influenza from Nasopharyngeal swab specimens and should not be used as a sole basis for treatment. Nasal washings and aspirates are unacceptable for Xpert Xpress SARS-CoV-2/FLU/RSV testing.  Fact Sheet for Patients: BloggerCourse.com  Fact Sheet for Healthcare Providers: SeriousBroker.it  This test is not yet approved or cleared by the [place] FDA and has been authorized for detection and/or diagnosis of SARS-CoV-2 by FDA under an Emergency Use Authorization (EUA). This EUA will remain in effect (meaning this test can be used) for the duration of the COVID-19 declaration under Section 564(b)(1) of the Act, 21 U.S.C. section 360bbb-3(b)(1), unless the authorization is terminated or revoked.  Performed at [facility], [address] [place], [place] [address]   Culture, blood (Routine X 2) w Reflex to ID Panel     Status: None   Collection Time: 09/29/20  6:41 PM   Specimen: BLOOD  Result Value Ref Range Status   Specimen Description BLOOD BLOOD RIGHT HAND  Final   Special Requests   Final    BOTTLES DRAWN AEROBIC AND ANAEROBIC Blood Culture results may not be optimal due to an inadequate volume of blood received in culture bottles   Culture   Final    NO GROWTH 5 DAYS Performed at [facility], [address], [place], [place] [address]    Report Status 10/04/2020 FINAL  Final  Culture, blood (Routine X  2) w Reflex to ID Panel     Status: None   Collection Time: 09/29/20  6:53 PM   Specimen: BLOOD  Result Value Ref Range Status   Specimen Description BLOOD BLOOD RIGHT FOREARM  Final   Special Requests   Final    BOTTLES DRAWN AEROBIC AND ANAEROBIC Blood Culture adequate volume   Culture   Final    NO GROWTH 5 DAYS Performed at [facility], [address], [place], [place] [address]    Report Status 10/04/2020 FINAL  Final  Urine Culture     Status: Abnormal   Collection Time: 09/30/20  1:18 AM   Specimen: Urine, Random  Result Value Ref Range Status   Specimen Description   Final    URINE, RANDOM Performed at [facility], [address], [place], [place] [address]    Special Requests   Final    NONE Performed at [facility], [address], [place], [place] [address]    Culture (A)  Final    <10,000 COLONIES/mL INSIGNIFICANT GROWTH Performed at [facility], [address], [place], [place] [address]    Report Status 10/01/2020 FINAL  Final      Imaging Studies   No results found.   Medications   Scheduled Meds: . amLODipine  10 mg Oral Daily  . aspirin EC  81 mg Oral Daily  . atorvastatin  40 mg Oral QHS  . carvedilol  12.5 mg Oral BID WC  . hydrALAZINE  100 mg Oral TID  . hydrocerin   Topical BID  . insulin aspart  0-9 Units Subcutaneous TID WC  . nicotine  14 mg Transdermal Daily   Continuous Infusions:      LOS: 4 days    Time spent: 20 minutes    Pennie Banter, DO [facility]  10/04/2020, 9:44 PM      If 7PM-7AM, please contact night-coverage. How to contact the [facility] Attending  or Consulting provider 7A - 7P or covering provider during after hours 7P -7A, for this patient?    1. Check the care team in [facility] and look for a) attending/consulting [facility] provider listed and b) the [facility] team listed 2. Log into [email or web address] and use [facility] universal password to access. If you do not have the  password, please contact the hospital operator. 3. Locate the [facility] provider you are looking for under [facility] and page to a number that you can be directly reached. 4. If you still have difficulty reaching the provider, please page the [facility] (Director on Call) for the Hospitalists listed on amion for assistance.

## 2020-10-04 NOTE — Progress Notes (Addendum)
Physical Therapy Treatment Patient Details Name: Tommy Perez MRN: [id number] DOB: 1965-10-16 Today's Date: 10/04/2020    History of Present Illness presented to ER secondary to 2-3 week history of unsteady gait, slurred speech and multiple falls; admitted for TIA/CVA work up.  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    PT Comments    Ready and motivated to participate in session today.  In bed with wet linens and soiled small BM.  Stated he spilt his drink this am and was unaware of BM.  To EOB with supervision and rail and care provided.   Pt with poor standing initially pulling up on walker with poor control despite cues.  He is able to stand for care with min a x 1 hands on assist for safety given balance and reliance on walker.  Upon sitting, right sock is falling off his foot.  When asked if he could fix it, he was unable given UE coordination deficits and needed assist to do so prior to walking in hallway.  He is able to complete 1 lap around unit with heavy reliance on walker for balance.  He has varied gait speed and generally unsteady requiring hands on assist at all times and cues to slow down and focus on balance, control and overall safety.  While he declines fatigue, gait quality does decrease with distance.  He did not use a walker prior to admission and is clear he would be unable to walk without significant assist without walker at this time.  After seated rest, standing ex is performed with RW and min a for support.  Sit to stand x 10 with strong emphasis on hand placements, control and balance. Transfers improve with practice and cues.  Fatigued with tasks but very motivated to participate in session and stated he is hopeful for transfer to CIR early next week.  Remained in recliner after session for breakfast.  Given PLOF,. need for multiple therapy disciplines and high  motivation for improvement, he remains appropriate for CIR upon discharge.   Follow Up Recommendations  CIR     Equipment Recommendations       Recommendations for Other Services       Precautions / Restrictions Precautions Precautions: Fall Precaution Comments: Watch HR Restrictions Other Position/Activity Restrictions: Permissive HTN (on 2/10 MD ok with pt participating with PT with BP 190/120) with plan to slowly bring BP down    Mobility  Bed Mobility Overal bed mobility: Needs Assistance Bed Mobility: Supine to Sit     Supine to sit: Supervision          Transfers Overall transfer level: Needs assistance Equipment used: Rolling walker (2 wheeled) Transfers: Sit to/from Stand Sit to Stand: Min assist;Min guard         General transfer comment: min assist initially progressing to min gaurd with practice and cues.  improved hand placements at end of session.  Ambulation/Gait Ambulation/Gait assistance: Min assist Gait Distance (Feet): 200 Feet Assistive device: Rolling walker (2 wheeled) Gait Pattern/deviations: Narrow base of support;Step-through pattern;Decreased step length - right;Decreased weight shift to right;Decreased weight shift to left Gait velocity: decreased   General Gait Details: able to complete lap with RW and hands on min assist at all timed to to antalgig gait pattern, inconsistant speed and general decrease in safety awareness.   Stairs             Psychologist, prison and probation services  Modified Rankin (Stroke Patients Only)       Balance Overall balance assessment: Needs assistance Sitting-balance support: No upper extremity supported;Feet supported Sitting balance-Leahy Scale: Fair     Standing balance support: Bilateral upper extremity supported;No upper extremity supported;During functional activity Standing balance-Leahy Scale: Fair Standing balance comment: static= good dynamic=poor                             Cognition Arousal/Alertness: Awake/alert Behavior During Therapy: WFL for tasks assessed/performed Overall Cognitive Status: Within Functional Limits for tasks assessed                                        Exercises Other Exercises Other Exercises: standing ex with min assist and walker support x 10 - heel raises, SLR and marches.  sit to stand x 10 to walker with emphsis on hand placement and control    General Comments        Pertinent Vitals/Pain Pain Assessment: No/denies pain    Home Living                      Prior Function            PT Goals (current goals can now be found in the care plan section) Progress towards PT goals: Progressing toward goals    Frequency    7X/week      PT Plan Current plan remains appropriate    Co-evaluation              AM-PAC PT "6 Clicks" Mobility   Outcome Measure  Help needed turning from your back to your side while in a flat bed without using bedrails?: None Help needed moving from lying on your back to sitting on the side of a flat bed without using bedrails?: A Little Help needed moving to and from a bed to a chair (including a wheelchair)?: A Little Help needed standing up from a chair using your arms (e.g., wheelchair or bedside chair)?: A Little Help needed to walk in hospital room?: A Little Help needed climbing 3-5 steps with a railing? : A Lot 6 Click Score: 18    End of Session Equipment Utilized During Treatment: Gait belt Activity Tolerance: Patient tolerated treatment well Patient left: with call bell/phone within reach;in chair;with chair alarm set Nurse Communication: Mobility status Hemiplegia - Right/Left: Left Hemiplegia - dominant/non-dominant: Non-dominant Hemiplegia - caused by: Cerebral infarction     Time: 0902-0925 PT Time Calculation (min) (ACUTE ONLY): 23 min  Charges:  $Gait Training: 8-22 mins $Therapeutic Exercise: 8-22 mins                     Danielle Dess, PTA 10/04/20, 9:47 AM

## 2020-10-05 ENCOUNTER — Inpatient Hospital Stay: Payer: 59

## 2020-10-05 DIAGNOSIS — F17210 Nicotine dependence, cigarettes, uncomplicated: Secondary | ICD-10-CM

## 2020-10-05 DIAGNOSIS — J90 Pleural effusion, not elsewhere classified: Secondary | ICD-10-CM | POA: Diagnosis not present

## 2020-10-05 DIAGNOSIS — I160 Hypertensive urgency: Secondary | ICD-10-CM | POA: Diagnosis not present

## 2020-10-05 LAB — GLUCOSE, CAPILLARY
Glucose-Capillary: 90 mg/dL (ref 70–99)
Glucose-Capillary: 159 mg/dL — ABNORMAL HIGH (ref 70–99)
Glucose-Capillary: 171 mg/dL — ABNORMAL HIGH (ref 70–99)
Glucose-Capillary: 118 mg/dL — ABNORMAL HIGH (ref 70–99)

## 2020-10-05 LAB — MAGNESIUM: Magnesium: 1.9 mg/dL (ref 1.7–2.4)

## 2020-10-05 LAB — LACTATE DEHYDROGENASE, PLEURAL OR PERITONEAL FLUID: LD, Fluid: 41 U/L — ABNORMAL HIGH (ref 3–23)

## 2020-10-05 LAB — CBC
HCT: 41.4 % (ref 39.0–52.0)
Hemoglobin: 12.8 g/dL — ABNORMAL LOW (ref 13.0–17.0)
MCH: 26.3 pg (ref 26.0–34.0)
MCHC: 30.9 g/dL (ref 30.0–36.0)
MCV: 85.2 fL (ref 80.0–100.0)
Platelets: 217 10*3/uL (ref 150–400)
RBC: 4.86 MIL/uL (ref 4.22–5.81)
RDW: 16.4 % — ABNORMAL HIGH (ref 11.5–15.5)
WBC: 5 10*3/uL (ref 4.0–10.5)
nRBC: 0 % (ref 0.0–0.2)

## 2020-10-05 LAB — BASIC METABOLIC PANEL
Anion gap: 6 (ref 5–15)
BUN: 33 mg/dL — ABNORMAL HIGH (ref 6–20)
CO2: 23 mmol/L (ref 22–32)
Calcium: 7.8 mg/dL — ABNORMAL LOW (ref 8.9–10.3)
Chloride: 109 mmol/L (ref 98–111)
Creatinine, Ser: 2.12 mg/dL — ABNORMAL HIGH (ref 0.61–1.24)
GFR, Estimated: 36 mL/min — ABNORMAL LOW (ref >60)
Glucose, Bld: 88 mg/dL (ref 70–99)
Potassium: 4.6 mmol/L (ref 3.5–5.1)
Sodium: 138 mmol/L (ref 135–145)

## 2020-10-05 LAB — PROTEIN C, TOTAL: Protein C, Total: 99 % (ref 60–150)

## 2020-10-05 LAB — PROTEIN, TOTAL: Total Protein: 5.9 g/dL — ABNORMAL LOW (ref 6.5–8.1)

## 2020-10-05 LAB — PROTEIN, PLEURAL OR PERITONEAL FLUID: Total protein, fluid: <3 g/dL

## 2020-10-05 LAB — LACTATE DEHYDROGENASE: LDH: 197 U/L — ABNORMAL HIGH (ref 98–192)

## 2020-10-05 NOTE — Progress Notes (Addendum)
Inpatient Rehab Admissions Coordinator:   Still unable to reach pt to discuss CIR, will continue efforts.  Note that no beds are available today/tomorrow.    Estill Dooms, PT, DPT Admissions Coordinator [phone] 10/05/20  11:43 AM

## 2020-10-05 NOTE — Plan of Care (Signed)
  Problem: Education: Goal: Knowledge of disease or condition will improve Outcome: Progressing Goal: Knowledge of secondary prevention will improve Outcome: Progressing Goal: Knowledge of patient specific risk factors addressed and post discharge goals established will improve Outcome: Progressing   Problem: Self-Care: Goal: Ability to participate in self-care as condition permits will improve Outcome: Progressing   

## 2020-10-05 NOTE — Progress Notes (Signed)
Inpatient Rehab Admissions Coordinator:   Spoke to patient over the phone to confirm interest in CIR.  He states he has 3 room mates, 2 of which are home 24/7 and can provide supervision to light min assist.  He agreed to start insurance authorization process.  I did let him know that he is mobilizing well already, and may progress to not qualifying for CIR from a functional stand point and he verbalizes understanding.  I will start insurance request today and continue to follow.   Estill Dooms, PT, DPT Admissions Coordinator [phone] 10/05/20  4:15 PM

## 2020-10-05 NOTE — Progress Notes (Signed)
Occupational Therapy Treatment Patient Details Name: Tommy Perez MRN: [id number] DOB: Apr 16, 1966 Today's Date: 10/05/2020    History of present illness presented to ER secondary to 2-3 week history of unsteady gait, slurred speech and multiple falls; admitted for TIA/CVA work up.  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   OT comments  Upon entering the room, pt seated in recliner chair and agreeable to OT intervention. Pt is motivated to shower. OT covered IV for safety. Pt standing and ambulating without use of RW with min A and min cuing for forward head and technique. Pt transferred into shower with mod A onto 3 in 1 for safety. Pt needing cuing for figure four position and lateral leans to wash buttocks with pt unable to reach B feet. Min A for LB self care this session. Pt exiting the shower with mod A and ambulating 20' to sit in recliner chair to complete drying and donning hospital gown. Min A to thread B socks onto feet. Pt then transferred into bed with min A. All needs within reach with pt reporting fatigue. Energy conservation education to be completed at next session. Pt continues to benefit from OT intervention and continues to want to pursue CIR before returning home.   Follow Up Recommendations  CIR    Equipment Recommendations  Other (comment) (defer to next venue of care)       Precautions / Restrictions Precautions Precautions: Fall Precaution Comments: Watch HR Restrictions Weight Bearing Restrictions: No Other Position/Activity Restrictions: Permissive HTN (on 2/10 MD ok with pt participating with PT with BP 190/120) with plan to slowly bring BP down       Mobility Bed Mobility Overal bed mobility: Needs Assistance Bed Mobility: Sit to Supine     Supine to sit: Min assist;Min guard Sit to supine: Supervision   General bed mobility comments: min  cuing for technique  Transfers Overall transfer level: Needs assistance Equipment used: None Transfers: Sit to/from [employer] Sit to Stand: Min assist Stand pivot transfers: Min assist       General transfer comment: min cuing for forward gaze and min cuing for technique and safety awareness    Balance Overall balance assessment: Needs assistance Sitting-balance support: No upper extremity supported;Feet supported Sitting balance-Leahy Scale: Fair     Standing balance support: No upper extremity supported;During functional activity Standing balance-Leahy Scale: Fair Standing balance comment: static= good dynamic=poor                           ADL either performed or assessed with clinical judgement   ADL Overall ADL's : Needs assistance/impaired         Upper Body Bathing: Supervision/ safety;Sitting   Lower Body Bathing: Minimal assistance;Sitting/lateral leans   Upper Body Dressing : Supervision/safety;Sitting   Lower Body Dressing: Minimal assistance;Sit to/from stand           Tub/ Engineer, structural: Moderate assistance;Ambulation;3 in 1   Functional mobility during ADLs: Minimal assistance;Cueing for safety       Vision Patient Visual Report: No change from baseline            Cognition Arousal/Alertness: Awake/alert Behavior During Therapy: WFL for tasks assessed/performed Overall Cognitive Status: Within Functional Limits for tasks assessed  General Comments: Pt was A and O x 3. Agrees to session and cooperative throughout. BP is stable range to participate.                   Pertinent Vitals/ Pain       Pain Assessment: No/denies pain         Frequency  Min 3X/week        Progress Toward Goals  OT Goals(current goals can now be found in the care plan section)  Progress towards OT goals: Progressing toward goals  Acute Rehab OT Goals Patient Stated  Goal: wants to go to CIR before returning home OT Goal Formulation: With patient Time For Goal Achievement: 10/14/20 Potential to Achieve Goals: Good  Plan Discharge plan remains appropriate       AM-PAC OT "6 Clicks" Daily Activity     Outcome Measure   Help from another person eating meals?: A Little Help from another person taking care of personal grooming?: A Little Help from another person toileting, which includes using toliet, bedpan, or urinal?: A Little Help from another person bathing (including washing, rinsing, drying)?: A Little Help from another person to put on and taking off regular upper body clothing?: A Little Help from another person to put on and taking off regular lower body clothing?: A Little 6 Click Score: 18    End of Session    OT Visit Diagnosis: Unsteadiness on feet (R26.81);Muscle weakness (generalized) (M62.81)   Activity Tolerance Patient tolerated treatment well   Patient Left in bed;with call bell/phone within reach;with family/visitor present   Nurse Communication Mobility status        Time: 1455-1535 OT Time Calculation (min): 40 min  Charges: OT General Charges $OT Visit: 1 Visit OT Treatments $Self Care/Home Management : 38-52 mins  Jackquline Denmark, MS, OTR/L , CBIS ascom [phone]  10/05/20, 5:02 PM

## 2020-10-05 NOTE — Progress Notes (Signed)
PROGRESS NOTE    Tommy CroakJames Perez   WUJ:[id number]  DOB: Jan 06, 1966  PCP: Patient, No Pcp Per    DOA: 09/29/2020 LOS: 5   Brief Narrative   55 year old male with past medical history of hypertension, diabetes mellitus type 2, nicotine dependence and medication noncompliance who presented to the ED on 09/29/20 with complaints of unsteady gait with frequent falls and slurred speech.  Symptoms had started 2-3 weeks prior but patient did not seek any medical attention.  MRI brain showed an area of restricted diffusion in the right anterior angulate gyrus.      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  MRI as above with acute/subacute stroke in addition to multiple prior/remote infarcts in bilateral cerebral hemispheres, L thalamus, b/l centrum semiovale -- ? cardioembolic etiology of strokes. 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  MBSS on 2/10, see  report.  On dysphagia-3 diet and seems tolerating well.  Short term memory impairment - follow up pending thiamine level.  TSH up but free T4 normal.  B12 level 514. RPR and HIV negative.    Hypertensive urgency - initially allowed for permissive HTN, okay to start slowly treating at this point.  Is getting IV Lasix BID. BP is improving. He has had a couple readings at goal, still spikes to 170s 180s systolic. --Continue hydralazine 100 mg TID --Continue amlodipine 10 mg daily --Add Coreg 12.5 mg BID --PRN oral hydralazine.  Acute diastolic CHF / HFpEF - presented with elevated BNP 1264 and bilateral pleural effusions.  Mild LVH on Echo.  CHF most likely due to longstanding uncontrolled HTN.   Echo EF 55-60%, grade 1 diastolic dfx, mild LVH Net IO Since Admission: -2,362.86 mL [10/05/20 1723] --Continue to hold diuresis, pt seems euvolemic --Monitor I/O's, daily weights --Monitor renal function and electrolytes  Complicated UTI - POA.  UA with pyuria and leuk-esterase.   Started on empiric antibiotics on admission.  Completed course.  AKI superimposed on CKD stage IIIa - POA with Cr 1.72 >> 2.17 after diuresis.  Baseline unclear but long history uncontrolled HTN and diabetes makes CKD IIIa most likely estimated baseline. AKI suspected multifactorial due to reduced PO intake since stroke, UTI - treated as above. Renal function has been stable. --Off IV fluids  --daily BMP  Hypernatremia - Resolved with D5-1/2NS fluids. Monitor BMP.  Due to poor PO intake due to above.  Off IV fluids to monitor if adequate PO intake.  Type 2 diabetes mellitus - sliding scale Novolog.  Follow up A1c.   Pleural effusion, bilateral -  suspect due to hypertensive / diastolic heart failure.  Started on IV lasix on admission, but stopped due to worsened renal function.  Monitor respiratory status.  Other mgmt as above. --Thoracentesis done on 2/14, 2.2 L removed from right pleural space --Follow-up fluid  studies   Elevated troponin due to demand ischemia - mildly elevated and flat trend, no chest pain or acute ischemic changes on EKG. Consistent with demand ischemia in setting of hypertensive urgency / CHF.    Nicotine dependence, cigarettes, uncomplicated - nicotine patch ordered per pt request.  Counseled on importance of cessation.    DVT prophylaxis: SCD's Start: 09/29/20 2226   Diet:  Diet Orders (From admission, onward)    Start     Ordered   10/03/20 1142  DIET DYS 3 Room service appropriate? Yes; Fluid consistency: Thin  Diet effective now       Question Answer Comment  Room service appropriate? Yes   Fluid consistency: Thin      10/03/20 1142            Code Status: Full Code    Subjective 10/05/20    Patient is up in chair when seen again today. He reports feeling well. He had just come back from thoracentesis. [place] Spaniel they took off a lot of fluid. He denies any acute complaints. Continues to look forward to going to CIR. Patient says he overall feels better since his blood pressures more controlled.   Disposition Plan & Communication   Status is: Inpatient  Inpatient status remains appropriate due to continuing to titrate blood pressure medications for adequate control of BP. Will be stable for discharge once BP controlled x24 to 48 hours with no medication changes.  Dispo: The patient is from: Home              Anticipated d/c is to: CIR              Anticipated d/c date is: 1-2 days              Patient currently is not medically stable for d/c.   Difficult to place patient: No   Family Communication: none at bedside, will attempt to call   Consults, Procedures, Significant Events   Consultants:   Neurology  IR for thoracentesis  Procedures:   Ultrasound-guided thoracentesis on 10/05/2020 -2.2 L fluid removed from the right pleural space  Antimicrobials:  Anti-infectives (From admission, onward)   Start     Dose/Rate Route Frequency Ordered Stop    09/29/20 2100  cefTRIAXone (ROCEPHIN) 1 g in sodium chloride 0.9 % 100 mL IVPB        1 g 200 mL/hr over 30 Minutes Intravenous Every 24 hours 09/29/20 2015 10/04/20 0743        Objective   Vitals:   10/05/20 0925 10/05/20 0948 10/05/20 1144 10/05/20 1641  BP: (!) 171/94 (!) 144/76 (!) 150/90 122/78  Pulse: 86 83 82 67  Resp: 18 18 16 16   Temp:   97.7 F (36.5 C) 98.1 F (36.7 C)  TempSrc:      SpO2: 97% 99% 98% 100%  Weight:      Height:        Intake/Output Summary (Last 24 hours) at 10/05/2020 1723 Last data filed at 10/05/2020 1028 Gross per 24 hour  Intake 360 ml  Output 550 ml  Net -190 ml   Filed Weights   09/29/20 1410  Weight: 77.1 kg    Physical Exam:  General exam: Awake,  alert, no acute distress Respiratory system: Symmetric chest rise, normal respiratory effort, on room air. Cardiovascular system: Regular rate and rhythm, no peripheral edema.   Central nervous system: Speech continues dysarthric but improved from the time of admission, left eye lateral gaze. right eye lateral deviation appears improved Gastrointestinal: Abdomen soft and nontender with positive bowel sounds   Labs   Data Reviewed: I have personally reviewed following labs and imaging studies  CBC: Recent Labs  Lab 09/29/20 1435 10/03/20 0526 10/05/20 0621  WBC 6.6 5.4 5.0  HGB 13.3 12.5* 12.8*  HCT 44.3 39.9 41.4  MCV 89.3 85.4 85.2  PLT 191 190 217   Basic Metabolic Panel: Recent Labs  Lab 10/01/20 0507 10/02/20 0513 10/03/20 0526 10/04/20 0516 10/05/20 0621  NA 147* 143 144 142 138  K 4.6 4.3 4.1 4.3 4.6  CL 111 108 111 111 109  CO2 GLUCOSE 78 113* 102* 60* 88  BUN 35* 37* 34* 33* 33*  CREATININE 2.17* 2.26* 2.07* 1.98* 2.12*  CALCIUM 8.0* 7.8* 7.8* 7.6* 7.8*  MG 2.2  --   --   --  1.9   GFR: Estimated Creatinine Clearance: 41.9 mL/min (A) (by C-G formula based on SCr of 2.12 mg/dL (H)). Liver Function Tests: Recent Labs  Lab  09/29/20 1435  AST 28  ALT 38  ALKPHOS 127*  BILITOT 0.4  PROT 6.9  ALBUMIN 2.5*   No results for input(s): LIPASE, AMYLASE in the last 168 hours. No results for input(s): AMMONIA in the last 168 hours. Coagulation Profile: No results for input(s): INR, PROTIME in the last 168 hours. Cardiac Enzymes: No results for input(s): CKTOTAL, CKMB, CKMBINDEX, TROPONINI in the last 168 hours. BNP (last 3 results) No results for input(s): PROBNP in the last 8760 hours. HbA1C: No results for input(s): HGBA1C in the last 72 hours. CBG: Recent Labs  Lab 10/04/20 1635 10/04/20 2145 10/05/20 0742 10/05/20 1142 10/05/20 1642  GLUCAP 99 103* 90 159* 171*   Lipid Profile: No results for input(s): CHOL, HDL, LDLCALC, TRIG, CHOLHDL, LDLDIRECT in the last 72 hours. Thyroid Function Tests: No results for input(s): TSH, T4TOTAL, FREET4, T3FREE, THYROIDAB in the last 72 hours. Anemia Panel: No results for input(s): VITAMINB12, FOLATE, FERRITIN, TIBC, IRON, RETICCTPCT in the last 72 hours. Sepsis Labs: Recent Labs  Lab 09/29/20 1841  PROCALCITON <0.10    Recent Results (from the past 240 hour(s))  Resp Panel by RT-PCR (Flu A&B, Covid) Nasopharyngeal Swab     Status: None   Collection Time: 09/29/20  5:00 PM   Specimen: Nasopharyngeal Swab; Nasopharyngeal(NP) swabs in vial transport medium  Result Value Ref Range Status   SARS Coronavirus 2 by RT PCR NEGATIVE NEGATIVE Final    Comment: (NOTE) SARS-CoV-2 target nucleic acids are NOT DETECTED.  The SARS-CoV-2 RNA is generally detectable in upper respiratory specimens during the acute phase of infection. The lowest concentration of SARS-CoV-2 viral copies this assay can detect is 138 copies/mL. A negative result does not preclude SARS-Cov-2 infection and should not be used as the sole basis for treatment or other patient management decisions. A negative result may occur with  improper specimen collection/handling, submission of specimen  other than nasopharyngeal swab, presence of viral mutation(s) within the areas targeted by this assay, and inadequate number of viral copies(<138 copies/mL). A negative result must be combined with clinical observations, patient history, and epidemiological information. The expected result is Negative.  Fact Sheet for Patients:  BloggerCourse.com  Fact Sheet for Healthcare Providers:  SeriousBroker.it  This test is no t yet approved or cleared by the [place] FDA and  has been authorized for detection and/or diagnosis of SARS-CoV-2 by FDA under an Emergency Use Authorization (EUA). This EUA will remain  in effect (meaning this test can be used) for the duration of the COVID-19 declaration under Section 564(b)(1) of the Act, 21 U.S.C.section 360bbb-3(b)(1), unless the authorization is terminated  or revoked sooner.       Influenza A by PCR NEGATIVE NEGATIVE Final   Influenza B by PCR NEGATIVE NEGATIVE Final    Comment: (NOTE) The Xpert Xpress SARS-CoV-2/FLU/RSV plus assay is intended as an aid in the diagnosis of influenza from Nasopharyngeal swab specimens and should not be used as a sole basis for treatment. Nasal washings and aspirates are unacceptable for Xpert Xpress SARS-CoV-2/FLU/RSV testing.  Fact Sheet for Patients: BloggerCourse.com  Fact Sheet for Healthcare Providers: SeriousBroker.it  This test is not yet approved or cleared by the [place] FDA and has been authorized for detection and/or diagnosis of SARS-CoV-2 by FDA under an Emergency Use Authorization (EUA). This EUA will remain in effect (meaning this test can be used) for the duration of the COVID-19 declaration under Section 564(b)(1) of the Act, 21 U.S.C. section 360bbb-3(b)(1), unless the authorization is terminated or revoked.  Performed at [facility], [address]  [place], [place] [address]   Culture, blood (Routine X 2) w Reflex to ID Panel     Status: None   Collection Time: 09/29/20  6:41 PM   Specimen: BLOOD  Result Value Ref Range Status   Specimen Description BLOOD BLOOD RIGHT HAND  Final   Special Requests   Final    BOTTLES DRAWN AEROBIC AND ANAEROBIC Blood Culture results may not be optimal due to an inadequate volume of blood received in culture bottles   Culture   Final    NO GROWTH 5 DAYS Performed at [facility], [address], [place], [place] [address]    Report Status 10/04/2020 FINAL  Final  Culture, blood (Routine X 2) w Reflex to ID Panel     Status: None   Collection Time: 09/29/20  6:53 PM   Specimen: BLOOD  Result Value Ref Range Status   Specimen Description BLOOD BLOOD RIGHT FOREARM  Final   Special Requests   Final    BOTTLES DRAWN AEROBIC AND ANAEROBIC Blood Culture adequate volume   Culture   Final    NO GROWTH 5 DAYS Performed at [facility], [address] [place], [place] [address]    Report Status 10/04/2020 FINAL  Final  Urine Culture     Status: Abnormal   Collection Time: 09/30/20  1:18 AM   Specimen: Urine, Random  Result Value Ref Range Status   Specimen Description   Final    URINE, RANDOM Performed at [facility], [address] [place], [place] [address]    Special Requests   Final    NONE Performed at [facility], [address] [place], [place] [address]    Culture (A)  Final    <10,000 COLONIES/mL INSIGNIFICANT GROWTH Performed at [facility], [address] [place], [place] [address]    Report Status 10/01/2020 FINAL  Final      Imaging Studies   DG Chest Port 1 View  Result Date: 10/05/2020 CLINICAL DATA:  Post thoracentesis EXAM: PORTABLE CHEST 1 VIEW COMPARISON:  04/29/2021 FINDINGS: Decreasing right  pleural effusion since prior study following thoracentesis. Small residual right pleural effusion. No pneumothorax. Left pleural  effusion, enlarged since prior chest x-ray. Left base atelectasis or infiltrate. Heart is normal size. IMPRESSION: Decreasing right effusion following thoracentesis. No pneumothorax. Small residual right pleural effusion and new layering left pleural effusion. Left base atelectasis or infiltrate. Electronically Signed   By: Charlett Nose M.D.   On: 10/05/2020 10:02   US THORACENTESIS ASP PLEURAL SPACE W/IMG GUIDE  Result Date: 10/05/2020 INDICATION: Patient with history of hypertension and diabetes. Presented to the ED with unsteady gait and frequent falls. Incidental finding of bilateral pleural effusions. Right greater than left. Patient presents for therapeutic and diagnostic thoracentesis EXAM: ULTRASOUND GUIDED THERAPEUTIC AND DIAGNOSTIC THORACENTESIS MEDICATIONS: Lidocaine 1% 10 mL COMPLICATIONS: None immediate. PROCEDURE: An ultrasound guided thoracentesis was thoroughly discussed with the patient and questions answered. The benefits, risks, alternatives and complications were also discussed. The patient understands and wishes to proceed with the procedure. Written consent was obtained. Ultrasound was performed to localize and mark an adequate pocket of fluid in the right chest. The area was then prepped and draped in the normal sterile fashion. 1% Lidocaine was used for local anesthesia. Under ultrasound guidance a 6 Fr Safe-T-Centesis catheter was introduced. Thoracentesis was performed. The catheter was removed and a dressing applied. FINDINGS: A total of approximately 2.2 L of straw-colored fluid was removed. Samples were sent to the laboratory as requested by the clinical team. IMPRESSION: Successful ultrasound guided therapeutic and diagnostic right-sided thoracentesis yielding 2.2 L of pleural fluid. Read by: Anders Grant, NP Electronically Signed   By: Acquanetta Belling M.D.   On: 10/05/2020 10:14     Medications   Scheduled Meds: . amLODipine  10 mg Oral Daily  . aspirin EC  81 mg Oral  Daily  . atorvastatin  40 mg Oral QHS  . carvedilol  12.5 mg Oral BID WC  . hydrALAZINE  100 mg Oral TID  . hydrocerin   Topical BID  . insulin aspart  0-9 Units Subcutaneous TID WC  . nicotine  14 mg Transdermal Daily   Continuous Infusions:      LOS: 5 days    Time spent: 25 minutes with greater than 50% spent at bedside in coordination of care.    Pennie Banter, DO [facility]  10/05/2020, 5:23 PM      If 7PM-7AM, please contact night-coverage. How to contact the [facility] Attending or Consulting provider 7A - 7P or covering provider during after hours 7P -7A, for this patient?    1. Check the care team in [facility] and look for a) attending/consulting TR[facility] provider listed and b) the [facility] team listed 2. Log into [email or web address] and use [place]'s universal password to access. If you do not have the password, please contact the hospital operator. 3. Locate the [facility] provider you are looking for under [facility] and page to a number that you can be directly reached. 4. If you still have difficulty reaching the provider, please page the [facility] (Director on Call) for the Hospitalists listed on amion for assistance.

## 2020-10-05 NOTE — Procedures (Signed)
Ultrasound-guided diagnostic and therapeutic right sided thoracentesis performed yielding 2.2 liters of straw colored fluid. No immediate complications.   Diagnostic fluid was sent to the lab for further analysis. Follow-up chest x-ray pending. EBL is < 2 ml.      

## 2020-10-05 NOTE — Progress Notes (Signed)
Physical Therapy Treatment Patient Details Name: Tommy Perez MRN: [id number] DOB: 05-Sep-1965 Today's Date: 10/05/2020    History of Present Illness presented to ER secondary to 2-3 week history of unsteady gait, slurred speech and multiple falls; admitted for TIA/CVA work up.  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    PT Comments    Pt ready for session.  He is inc of large loose BM but had not called nursing for assist.  Care provided.  He is able to get to EOB with min guard and rail and stands to RW.  He walks to bathroom and is inc of loose BM along the way.  He leaves walker outside of room with catheter bag attached and needs assist to stop.  He continues to walk into bathroom without AD and poor safety holding onto objects along the way.  He spends time on commode to compete BM.  He stands and is able to compete his care with cues.  He continues to walk out of bathroom without AD with overall unsteady and poor gait holding onto door frames and sink.  He is encouraged to continue to try gait without AD but he stated he does not feel comfortable and elects to use RW after 20'.  He is able to complete x 1 lap around with RW and min a x 1 but is limited on distance due to LE weakness.  Gait is improved over yesterday with RW however.  He continues with session after a short rest for standing LE ex with min a x 1 and walker support.  Pt continues with therapy needs and +1 assist at all times for balance and safety cues.  He did not use AD upon admission.  He is unsafe to walk unassisted at this time due to safety and high fall risk.  CIR remains appropriate upon discharge. If CIR is not available to him he will need SNF for continued interventions.  Discharge home would require RW and +1 assist for mobility.   Follow Up Recommendations  CIR - would recommend SNF if CIR is declined  or not available to him     Equipment Recommendations       Recommendations for Other Services       Precautions / Restrictions Precautions Precautions: Fall Precaution Comments: Watch HR Restrictions Weight Bearing Restrictions: No Other Position/Activity Restrictions: Permissive HTN (on 2/10 MD ok with pt participating with PT with BP 190/120) with plan to slowly bring BP down    Mobility  Bed Mobility Overal bed mobility: Needs Assistance Bed Mobility: Supine to Sit     Supine to sit: Min assist;Min guard          Transfers Overall transfer level: Needs assistance Equipment used: Rolling walker (2 wheeled);None Transfers: Sit to/from Stand Sit to Stand: Min assist;Min guard            Ambulation/Gait Ambulation/Gait assistance: Editor, commissioning (Feet): 15 Feet Assistive device: None Gait Pattern/deviations: Narrow base of support;Step-through pattern;Decreased step length - right;Decreased weight shift to right;Decreased weight shift to left Gait velocity: decreased   General Gait Details: very unsteady without RW holding onto doorframes and sink demonstrating poor safety   Stairs             Wheelchair Mobility    Modified Rankin (Stroke Patients Only)       Balance Overall balance assessment: Needs assistance Sitting-balance support:  No upper extremity supported;Feet supported Sitting balance-Leahy Scale: Fair     Standing balance support: No upper extremity supported;During functional activity Standing balance-Leahy Scale: Poor Standing balance comment: static= good dynamic=poor                            Cognition Arousal/Alertness: Awake/alert Behavior During Therapy: WFL for tasks assessed/performed Overall Cognitive Status: Within Functional Limits for tasks assessed                                 General Comments: Pt was A and O x 3. Agrees to session and cooperative throughout. BP is stable  range to participate.      Exercises      General Comments        Pertinent Vitals/Pain Pain Assessment: No/denies pain    Home Living                      Prior Function            PT Goals (current goals can now be found in the care plan section) Progress towards PT goals: Progressing toward goals    Frequency    7X/week      PT Plan Current plan remains appropriate    Co-evaluation              AM-PAC PT "6 Clicks" Mobility   Outcome Measure  Help needed turning from your back to your side while in a flat bed without using bedrails?: None Help needed moving from lying on your back to sitting on the side of a flat bed without using bedrails?: A Little Help needed moving to and from a bed to a chair (including a wheelchair)?: A Little Help needed standing up from a chair using your arms (e.g., wheelchair or bedside chair)?: A Little Help needed to walk in hospital room?: A Little Help needed climbing 3-5 steps with a railing? : A Lot 6 Click Score: 18    End of Session Equipment Utilized During Treatment: Gait belt Activity Tolerance: Patient tolerated treatment well Patient left: with call bell/phone within reach;in chair;with chair alarm set Nurse Communication: Mobility status Hemiplegia - Right/Left: Left Hemiplegia - dominant/non-dominant: Non-dominant Hemiplegia - caused by: Cerebral infarction     Time: 5093-2671 PT Time Calculation (min) (ACUTE ONLY): 15 min  Charges:  $Gait Training: 8-22 mins                    Danielle Dess, PTA 10/05/20, 4:44 PM

## 2020-10-06 LAB — GLUCOSE, CAPILLARY: Glucose-Capillary: 90 mg/dL (ref 70–99)

## 2020-10-06 LAB — BASIC METABOLIC PANEL
Anion gap: 6 (ref 5–15)
BUN: 34 mg/dL — ABNORMAL HIGH (ref 6–20)
CO2: 22 mmol/L (ref 22–32)
Calcium: 7.7 mg/dL — ABNORMAL LOW (ref 8.9–10.3)
Chloride: 109 mmol/L (ref 98–111)
Creatinine, Ser: 2.19 mg/dL — ABNORMAL HIGH (ref 0.61–1.24)
GFR, Estimated: 35 mL/min — ABNORMAL LOW (ref >60)
Glucose, Bld: 91 mg/dL (ref 70–99)
Potassium: 4.5 mmol/L (ref 3.5–5.1)
Sodium: 137 mmol/L (ref 135–145)

## 2020-10-06 LAB — PROTEIN, BODY FLUID (OTHER): Total Protein, Body Fluid Other: 1 g/dL

## 2020-10-06 LAB — CYTOLOGY - NON PAP

## 2020-10-06 MED ORDER — ASPIRIN 81 MG PO TBEC: 81.0000 mg | DELAYED_RELEASE_TABLET | Freq: Every day | ORAL | 2 refills | Status: AC

## 2020-10-06 MED ORDER — AMLODIPINE BESYLATE 10 MG PO TABS: 10.0000 mg | ORAL_TABLET | Freq: Every day | ORAL | 2 refills | Status: AC

## 2020-10-06 MED ORDER — CLOPIDOGREL BISULFATE 75 MG PO TABS: 75.0000 mg | ORAL_TABLET | Freq: Every day | ORAL | 11 refills | Status: AC

## 2020-10-06 MED ORDER — HYDRALAZINE HCL 100 MG PO TABS: 100.0000 mg | ORAL_TABLET | Freq: Three times a day (TID) | ORAL | 2 refills | Status: AC

## 2020-10-06 MED ORDER — ATORVASTATIN CALCIUM 40 MG PO TABS: 40.0000 mg | ORAL_TABLET | Freq: Every day | ORAL | 2 refills | Status: AC

## 2020-10-06 MED ORDER — CARVEDILOL 12.5 MG PO TABS: 12.5000 mg | ORAL_TABLET | Freq: Two times a day (BID) | ORAL | 2 refills | Status: AC

## 2020-10-06 NOTE — Discharge Summary (Signed)
Physician Discharge Summary  Art Levan IOE:[id number] DOB: [birth year] DOA: 09/29/2020  PCP: Patient, No Pcp Per  Admit date: 09/29/2020 Discharge date: 10/14/2020  Admitted From: home Disposition:  home  Recommendations for Outpatient Follow-up:  1. Follow up with PCP in 1-2 weeks 2. Please obtain BMP/CBC in one week 3. Please follow up with cardiology for 30 day heart monitor 4. 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) Active Problems:   AKI (acute kidney injury) ([place])   Type 2 diabetes mellitus with stage 3a chronic kidney disease, without long-term current use of insulin (HCC)   Hypertensive urgency   Pleural effusion, bilateral   Acute congestive heart failure (HCC)   Elevated troponin level not due myocardial infarction   Nicotine dependence, cigarettes, uncomplicated   Complicated UTI (urinary tract infection)    Summary of HPI and Hospital Course:  55 year old male with past medical history of hypertension, diabetes mellitus type 2, nicotine dependence and medication noncompliance who presented to the ED on 09/29/20 with complaints of unsteady gait with frequent falls and slurred speech.  Symptoms had started 2-3 weeks prior but patient did not seek any medical attention.  MRI brain showed an area of restricted diffusion in the right anterior angulate gyrus, consistent with acute/subacute stroke.      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  MRI as above with acute/subacute stroke in addition to multiple prior/remote infarcts in bilateral cerebral hemispheres, L thalamus, b/l centrum semiovale --  ? cardioembolic  etiology of strokes. --Neurology was consulted --Echo negative for shunt --Telemetry monitoring was unremarkable  --Referral to cardiology for ambulatory monitor (unable to get placed while inpatient) --Continue ASA and Plavix --Started on Lipitor 40 mg daily --PT/OT/SLP recommend CIR - pt ultimately decided he wants to go home, feels he is doing well enough --Hypercoag workup pending  Dysphagia / Dysarthria due to stroke - SLP following, as above.   MBSS on 2/10, see report.  On dysphagia-3 diet and seems tolerating well.  Short term memory impairment - follow up pending thiamine level.  TSH up but free T4 normal.  B12 level 514. RPR and HIV negative.    Hypertensive urgency - initially allowed for permissive HTN.  BP medications were brought on board and up-titrated until well-controlled.   --Discharge regimen: hydralazine 100 mg TID, amlodipine 10 mg daily, Coreg 12.5 mg BID  Acute diastolic CHF / HFpEF - presented with elevated BNP 1264 and bilateral pleural effusions.  Mild LVH on Echo.  CHF most likely due to longstanding uncontrolled HTN.   Echo EF 18-29%, grade 1 diastolic dfx, mild LVH Net IO Since Admission: -2,362.86 mL [93/71/69 6789]  Complicated UTI - POA.  UA with pyuria and leuk-esterase.   Started on empiric antibiotics on admission.  Completed course.  AKI superimposed on CKD stage IIIa - POA with Cr 1.72 >> 2.17 after diuresis.   Baseline unclear but long history uncontrolled HTN and diabetes makes CKD IIIa most likely estimated baseline.  AKI suspected multifactorial due to reduced PO intake since stroke, UTI - treated as above. Renal function has been stable. --Renal function improved IV fluids  Hypernatremia - Resolved with D5-1/2NS fluids. Due to  poor PO intake due to above.    Type 2 diabetes mellitus - sliding scale Novolog.   Pleural effusion, bilateral - suspect due to hypertensive / diastolic heart failure.  Started on IV lasix on admission, but  stopped due to worsened renal function.  Monitor respiratory status.  Other mgmt as above. --Thoracentesis done on 2/14, 2.2 L removed from right pleural space --Follow-up fluid studies   Elevated troponin due to demand ischemia - mildly elevated and flat trend, no chest pain or acute ischemic changes on EKG. Consistent with demand ischemia in setting of hypertensive urgency / CHF.    Nicotine dependence, cigarettes, uncomplicated - nicotine patch ordered per pt request.  Counseled on importance of cessation.    Discharge Instructions   Discharge Instructions    Call MD for:  extreme fatigue   Complete by: As directed    Call MD for:  persistant dizziness or light-headedness   Complete by: As directed    Call MD for:  persistant nausea and vomiting   Complete by: As directed    Call MD for:  severe uncontrolled pain   Complete by: As directed    Call MD for:  temperature >100.4   Complete by: As directed    Diet - low sodium heart healthy   Complete by: As directed    Discharge instructions   Complete by: As directed    Please check your blood pressure twice a day. Write down your BP's to bring to follow up doctor appointments. If you see BP readings too high or too low, you should call your doctor for instructions.  Call doctor if -- BP too LOW (top number below 90 or bottom number below 50) or  BP too HIGH (top number above 150 or bottom number above 90)   Please be sure to see [facility] Card[facility] (contact info included in this packet). Neurology wants you to wear a 30 day heart monitor to make sure you don't have any abnormal heart rhythms.  You should also follow up with a neurologist regarding your stroke.   [facility] Neurology contact info is included in this packet, but you can choose another one or let your primary care doctor refer you to a neurologist they recommend.   Increase activity slowly   Complete by: As directed      Allergies as of 10/06/2020      Reactions    Penicillins       Medication List    TAKE these medications   amLODipine 10 MG tablet Commonly known as: NORVASC Take 1 tablet (10 mg total) by mouth daily.   aspirin 81 MG EC tablet Take 1 tablet (81 mg total) by mouth daily. Swallow whole.   atorvastatin 40 MG tablet Commonly known as: LIPITOR Take 1 tablet (40 mg total) by mouth at bedtime.   carvedilol 12.5 MG tablet Commonly known as: COREG Take 1 tablet (12.5 mg total) by mouth 2 (two) times daily with a meal.   clopidogrel 75 MG tablet Commonly known as: Plavix Take 1 tablet (75 mg total) by mouth daily.   empagliflozin 10 MG Tabs tablet Commonly known as: JARDIANCE Take 10 mg by mouth daily. Notes to patient: Not given in hospital   hydrALAZINE 100 MG tablet Commonly known as: APRESOLINE Take 1 tablet (100 mg total) by mouth 3 (three) times daily. What changed:   medication strength  how much to take  when to take this   metFORMIN 1000 MG tablet Commonly known  as: GLUCOPHAGE Take 1,000 mg by mouth in the morning and at bedtime. Notes to patient: Not given in hospital       Follow-up [place], NP. Schedule an appointment as soon as possible for a visit.   Why: Patient needs 30 ambulatory heart monitor to look for A-fib to complete CVA workup Contact information: [place] [address] [phone]        Crissie Figures, PA-C. Schedule an appointment as soon as possible for a visit in 1 week(s).   Specialty: Physician Assistant Why: Hospital follow up  Contact information: [place] [address] [phone]        [place]. Schedule an appointment as soon as possible for a visit in 3 week(s).   Why: Follow for recurrent CVA's Contact information: [place] [phone]             Allergies  Allergen Reactions  . Penicillins Other (See  Comments)     If you experience worsening of your admission symptoms, develop shortness of breath, life threatening emergency, suicidal or homicidal thoughts you must seek medical attention immediately by calling 911 or calling your MD immediately  if symptoms less severe.    Please note   You were cared for by a hospitalist during your hospital stay. If you have any questions about your discharge medications or the care you received while you were in the hospital after you are discharged, you can call the unit and asked to speak with the hospitalist on call if the hospitalist that took care of you is not available. Once you are discharged, your primary care physician will handle any further medical issues. Please note that NO REFILLS for any discharge medications will be authorized once you are discharged, as it is imperative that you return to your primary care physician (or establish a relationship with a primary care physician if you do not have one) for your aftercare needs so that they can reassess your need for medications and monitor your lab values.   Consultations:  Neurology  IR    Procedures/Studies: DG Abd 1 View  Result Date: 10/08/2020 CLINICAL DATA:  Feeding tube placement. EXAM: ABDOMEN - 1 VIEW COMPARISON:  None. FINDINGS: Tip and side port of the enteric tube are below the diaphragm in the stomach. There is likely gastric distension with stomach extending into the mid abdomen. High-density material in the colon from prior barium swallow. Bilateral lung opacities in the included lung bases. IMPRESSION: Tip and side port of the enteric tube below the diaphragm in the stomach. Probable gastric distension. Electronically Signed   By: Keith Rake M.D.   On: 10/08/2020 00:34   CT Head Wo Contrast  Result Date: 09/24/2020 CLINICAL DATA:  Recent CVA EXAM: CT HEAD WITHOUT CONTRAST TECHNIQUE: Contiguous axial images were obtained from the base of the skull through the vertex  without intravenous contrast. COMPARISON:  Head CT September 29, 2020 and brain MRI September 29, 2020 FINDINGS: Brain: There is stable mild diffuse atrophy for age. There is no demonstrable intracranial mass, hemorrhage, extra-axial fluid collection, or midline shift. There is focal decreased attenuation in the medial right anterior cingulate gyrus at the site of recent acute infarct, demonstrated on recent brain MRI. This area appears essentially stable in size and contour by noncontrast enhanced head CT. There is no associated hemorrhage. Elsewhere there is patchy small vessel disease in the  centra semiovale bilaterally. There is evidence of a prior infarct in the right pons. Scattered small foci of decreased attenuation noted in each cerebellar hemisphere, head of caudate nucleus on the right, and in the left thalamus consistent with prior infarcts. No new infarct compared to 1 week prior. Vascular: No hyperdense vessel. There is calcification in each carotid siphon region. Skull: The bony calvarium appears intact. Sinuses/Orbits: Mucosal thickening noted in several ethmoid air cells as well as mucosal thickening in the anterior left sphenoid sinus. Orbits appear symmetric bilaterally. Other: Mastoid air cells are clear. IMPRESSION: Mild atrophy for age. There is periventricular small vessel disease as well as prior infarcts at several sites, stable. No change in decreased attenuation in the medial right cingulate gyrus at the site of recent acute infarct documented by MR. No new infarct evident. No mass or hemorrhage. There are foci arterial vascular calcification. Areas of paranasal sinus disease noted. Electronically Signed   By: Lowella Grip III M.D.   On: 10/09/2020 13:29   CT Head Wo Contrast  Result Date: 09/29/2020 CLINICAL DATA:  Bilateral lower extremity weakness.  Hypertension. EXAM: CT HEAD WITHOUT CONTRAST TECHNIQUE: Contiguous axial images were obtained from the base of the skull through the  vertex without intravenous contrast. COMPARISON:  None. FINDINGS: Brain: No evidence of acute infarction, hemorrhage, hydrocephalus, extra-axial collection or mass lesion/mass effect. Chronic microvascular ischemic change noted. Vascular: No hyperdense vessel or unexpected calcification. Skull: Intact.  No focal lesion. Sinuses/Orbits: Negative. Other: None. IMPRESSION: No acute abnormality. Chronic microvascular ischemic change. Electronically Signed   By: Inge Rise M.D.   On: 09/29/2020 15:09   CT Chest W Contrast  Result Date: 09/29/2020 CLINICAL DATA:  Tobacco abuse, hypertension, abnormal chest x-ray EXAM: CT CHEST WITH CONTRAST TECHNIQUE: Multidetector CT imaging of the chest was performed during intravenous contrast administration. CONTRAST:  1m OMNIPAQUE IOHEXOL 300 MG/ML  SOLN COMPARISON:  09/29/2020 FINDINGS: Cardiovascular: The heart is unremarkable without pericardial effusion. No evidence of thoracic aortic aneurysm or dissection. There are no filling defects or pulmonary emboli. Mediastinum/Nodes: No enlarged mediastinal, hilar, or axillary lymph nodes. Thyroid gland, trachea, and esophagus demonstrate no significant findings. Lungs/Pleura: There are bilateral pleural effusions, right greater than left. On the right, pleural fluid estimated at less than 2 L in on the left less than 1 L. there is dense consolidation within the right middle and right lower lobes compatible with atelectasis. Mucoid material is seen within the right mainstem bronchus and bronchus intermedius. Scattered throughout the remainder of the aerated lungs, most pronounced in the right upper lobe, there are ground-glass areas of consolidation most consistent with infection or inflammation. Bilateral bronchial wall thickening, again most pronounced within the right upper lobe. No pneumothorax. Upper Abdomen: No acute abnormality. Musculoskeletal: There are no acute or destructive bony lesions. Reconstructed images  demonstrate no additional findings. IMPRESSION: 1. Bilateral pleural effusions, right greater than left. 2. Scattered bilateral ground-glass airspace disease greatest in the right upper lobe, with diffuse bronchial wall thickening. Overall, findings are most consistent with bronchopneumonia. 3. Dense right middle and right lower lobe consolidation, consistent with atelectasis. 4. Mucoid material within the right mainstem bronchus and bronchus intermedius. This could reflect retained secretions versus aspiration. Electronically Signed   By: MRanda NgoM.D.   On: 09/29/2020 16:06   MR ANGIO HEAD WO CONTRAST  Result Date: 09/30/2020 CLINICAL DATA:  Stroke EXAM: MRA HEAD WITHOUT CONTRAST TECHNIQUE: Angiographic images of the Circle of Willis were obtained using MRA technique without intravenous  contrast. COMPARISON:  None. FINDINGS: Both vertebral arteries patent to the basilar. Left vertebral artery dominant. PICA patent bilaterally. Basilar widely patent. Posterior cerebral arteries widely patent bilaterally. Internal carotid artery widely patent bilaterally with mild atherosclerotic irregularity on the left. Anterior and middle cerebral arteries patent bilaterally. Negative for large vessel occlusion.  Negative for aneurysm. IMPRESSION: Negative MRA head Electronically Signed   By: Franchot Gallo M.D.   On: 09/30/2020 11:09   MR ANGIO NECK W WO CONTRAST  Result Date: 09/30/2020 CLINICAL DATA:  Stroke. EXAM: MRA NECK WITHOUT AND WITH CONTRAST TECHNIQUE: Multiplanar and multiecho pulse sequences of the neck were obtained without and with intravenous contrast. Angiographic images of the neck were obtained using MRA technique without and with intravenous contrast. CONTRAST:  42m GADAVIST GADOBUTROL 1 MMOL/ML IV SOLN COMPARISON:  None. FINDINGS: Antegrade flow in the carotid and vertebral arteries bilaterally. Carotid bifurcation widely patent.  No significant carotid stenosis Left vertebral artery dominant and  widely patent. Non dominant right vertebral artery widely patent without stenosis. IMPRESSION: Negative MRA neck. Electronically Signed   By: CFranchot GalloM.D.   On: 09/30/2020 11:11   MR BRAIN WO CONTRAST  Result Date: 09/29/2020 CLINICAL DATA:  Neuro deficit, acute, stroke suspected. EXAM: MRI HEAD WITHOUT CONTRAST TECHNIQUE: Multiplanar, multiecho pulse sequences of the brain and surrounding structures were obtained without intravenous contrast. COMPARISON:  Head CT September 29, 2020 FINDINGS: Brain: Focus of restricted diffusion in the anterior right cingulate gyrus. Remote infarcts in the bilateral cerebellar hemisphere, right side of the pons, left thalamus and bilateral centrum semiovale. Scattered foci of T2 hyperintensity are seen within the white matter of cerebral hemispheres and within the pons, nonspecific, most likely related to chronic microangiopathic changes. Mild parenchymal volume loss. No hemorrhage, hydrocephalus, extra-axial collection or mass lesion. Vascular: Normal flow voids. Skull and upper cervical spine: Normal marrow signal. Sinuses/Orbits: Divergent gaze.  Paranasal sinuses are clear. IMPRESSION: 1. Focus of restricted diffusion in the anterior right cingulate gyrus, consistent with acute/subacute infarct. 2. Remote infarcts in the bilateral cerebellar hemisphere, right side of the pons, left thalamus and bilateral centrum semiovale. 3. Moderate chronic microangiopathic changes, more pronounced than expected for age. These results were called by telephone at the time of interpretation on 09/29/2020 at 4:55 pm to [facility], who verbally acknowledged these results. Electronically Signed   By: KPedro EarlsM.D.   On: 09/29/2020 16:55   MR CERVICAL SPINE WO CONTRAST  Result Date: 10/01/2020 CLINICAL DATA:  Initial evaluation for unsteady gait, slurred speech, myelopathy. EXAM: MRI CERVICAL, THORACIC AND LUMBAR SPINE WITHOUT CONTRAST TECHNIQUE:  Multiplanar and multiecho pulse sequences of the cervical spine, to include the craniocervical junction and cervicothoracic junction, and thoracic and lumbar spine, were obtained without intravenous contrast. COMPARISON:  None available. FINDINGS: MRI CERVICAL SPINE FINDINGS Alignment: Examination severely degraded by motion artifact, and is nearly nondiagnostic. Straightening of the normal cervical lordosis.  No listhesis. Vertebrae: Vertebral body height maintained without visible acute or chronic fracture. Bone marrow signal intensity grossly within normal limits. No visible discrete or worrisome osseous lesions. No abnormal marrow edema. Cord: Signal intensity within the cervical spinal cord is grossly within normal limits. Grossly normal cord caliber and morphology. Posterior Fossa, vertebral arteries, paraspinal tissues: Visualized brain and posterior fossa within normal limits. Craniocervical junction normal. Paraspinous and prevertebral soft tissues grossly within normal limits. Vertebral artery flow voids grossly maintained. Disc levels: C2-C3: Unremarkable. C3-C4: Mild disc bulge. No definite spinal stenosis. Foramina  are grossly patent. C4-C5: Mild disc bulge. No more than mild spinal stenosis. Foramina remain grossly patent. C5-C6: Mild diffuse disc bulge. No more than mild spinal stenosis. Foramina are grossly patent. C6-C7:  Grossly negative. C7-T1:  Grossly negative. MRI THORACIC SPINE FINDINGS Alignment: Examination severely degraded by motion artifact and nearly nondiagnostic. Alignment within normal limits with preservation of the normal thoracic kyphosis. No listhesis. Vertebrae: Vertebral body height maintained without acute or chronic fracture. Bone marrow signal intensity grossly within normal limits. No visible discrete or worrisome osseous lesions or abnormal marrow edema. Cord: Signal intensity within the thoracic spinal cord is grossly within normal limits. Grossly normal cord caliber and  morphology. Mild diffuse prominence of the dorsal epidural fat noted. Paraspinal and other soft tissues: Paraspinous soft tissues demonstrate no obvious abnormality. Large layering bilateral pleural effusions noted, right larger than left. Disc levels: T9-10: Suspected small central disc protrusion mildly indents the ventral thecal sac (series 21, image 32). Minimal flattening of the ventral cord without cord signal changes or significant spinal stenosis. No other appreciable or significant disc pathology seen within the thoracic spine. No stenosis or neural impingement. MRI LUMBAR SPINE FINDINGS Segmentation: Examination severely degraded by motion artifact and nearly nondiagnostic. Standard segmentation. Lowest well-formed disc space labeled the L5-S1 level. Alignment: Physiologic with preservation of the normal lumbar lordosis. No listhesis. Vertebrae: Vertebral body height maintained without acute or chronic fracture. Bone marrow signal intensity grossly within normal limits. No visible worrisome osseous lesions. No abnormal marrow edema. Conus medullaris and cauda equina: Conus extends to the L1 level. Conus and cauda equina appear grossly within normal limits. Paraspinal and other soft tissues: Paraspinous soft tissues demonstrate no obvious abnormality. Disc levels: L1-2:  Unremarkable. L2-3:  Unremarkable. L3-4: Minimal annular disc bulge. No spinal stenosis. Foramina appear grossly patent. L4-5: Minimal disc bulge. No significant spinal stenosis. Foramina remain grossly patent. L5-S1:  Unremarkable. IMPRESSION: MRI CERVICAL SPINE IMPRESSION: 1. Severely limited exam due to extensive motion artifact. 2. Grossly normal MRI appearance of the cervical spinal cord. Mild noncompressive disc bulging at C3-4 through C5-6 with no more than mild spinal stenosis. MRI THORACIC SPINE IMPRESSION: 1. Severely limited exam due to extensive motion artifact. 2. Grossly normal MRI appearance of the thoracic spinal cord. No  definite cord signal abnormality to suggest edema or myelomalacia. No significant spinal stenosis or neural impingement. 3. Suspected small central disc protrusion at T9-10 without significant stenosis. 4. Large layering bilateral pleural effusions, right greater than left. MRI LUMBAR SPINE IMPRESSION: 1. Severely limited exam due to motion artifact and nearly nondiagnostic exam. 2. No acute abnormality within the lumbar spine. No significant spinal stenosis or evidence for neural impingement. 3. Minimal disc bulging at L3-4 and L4-5 without significant stenosis. Electronically Signed   By: Jeannine Boga M.D.   On: 10/01/2020 16:19   MR THORACIC SPINE WO CONTRAST  Result Date: 10/01/2020 CLINICAL DATA:  Initial evaluation for unsteady gait, slurred speech, myelopathy. EXAM: MRI CERVICAL, THORACIC AND LUMBAR SPINE WITHOUT CONTRAST TECHNIQUE: Multiplanar and multiecho pulse sequences of the cervical spine, to include the craniocervical junction and cervicothoracic junction, and thoracic and lumbar spine, were obtained without intravenous contrast. COMPARISON:  None available. FINDINGS: MRI CERVICAL SPINE FINDINGS Alignment: Examination severely degraded by motion artifact, and is nearly nondiagnostic. Straightening of the normal cervical lordosis.  No listhesis. Vertebrae: Vertebral body height maintained without visible acute or chronic fracture. Bone marrow signal intensity grossly within normal limits. No visible discrete or worrisome osseous lesions. No abnormal  marrow edema. Cord: Signal intensity within the cervical spinal cord is grossly within normal limits. Grossly normal cord caliber and morphology. Posterior Fossa, vertebral arteries, paraspinal tissues: Visualized brain and posterior fossa within normal limits. Craniocervical junction normal. Paraspinous and prevertebral soft tissues grossly within normal limits. Vertebral artery flow voids grossly maintained. Disc levels: C2-C3: Unremarkable.  C3-C4: Mild disc bulge. No definite spinal stenosis. Foramina are grossly patent. C4-C5: Mild disc bulge. No more than mild spinal stenosis. Foramina remain grossly patent. C5-C6: Mild diffuse disc bulge. No more than mild spinal stenosis. Foramina are grossly patent. C6-C7:  Grossly negative. C7-T1:  Grossly negative. MRI THORACIC SPINE FINDINGS Alignment: Examination severely degraded by motion artifact and nearly nondiagnostic. Alignment within normal limits with preservation of the normal thoracic kyphosis. No listhesis. Vertebrae: Vertebral body height maintained without acute or chronic fracture. Bone marrow signal intensity grossly within normal limits. No visible discrete or worrisome osseous lesions or abnormal marrow edema. Cord: Signal intensity within the thoracic spinal cord is grossly within normal limits. Grossly normal cord caliber and morphology. Mild diffuse prominence of the dorsal epidural fat noted. Paraspinal and other soft tissues: Paraspinous soft tissues demonstrate no obvious abnormality. Large layering bilateral pleural effusions noted, right larger than left. Disc levels: T9-10: Suspected small central disc protrusion mildly indents the ventral thecal sac (series 21, image 32). Minimal flattening of the ventral cord without cord signal changes or significant spinal stenosis. No other appreciable or significant disc pathology seen within the thoracic spine. No stenosis or neural impingement. MRI LUMBAR SPINE FINDINGS Segmentation: Examination severely degraded by motion artifact and nearly nondiagnostic. Standard segmentation. Lowest well-formed disc space labeled the L5-S1 level. Alignment: Physiologic with preservation of the normal lumbar lordosis. No listhesis. Vertebrae: Vertebral body height maintained without acute or chronic fracture. Bone marrow signal intensity grossly within normal limits. No visible worrisome osseous lesions. No abnormal marrow edema. Conus medullaris and cauda  equina: Conus extends to the L1 level. Conus and cauda equina appear grossly within normal limits. Paraspinal and other soft tissues: Paraspinous soft tissues demonstrate no obvious abnormality. Disc levels: L1-2:  Unremarkable. L2-3:  Unremarkable. L3-4: Minimal annular disc bulge. No spinal stenosis. Foramina appear grossly patent. L4-5: Minimal disc bulge. No significant spinal stenosis. Foramina remain grossly patent. L5-S1:  Unremarkable. IMPRESSION: MRI CERVICAL SPINE IMPRESSION: 1. Severely limited exam due to extensive motion artifact. 2. Grossly normal MRI appearance of the cervical spinal cord. Mild noncompressive disc bulging at C3-4 through C5-6 with no more than mild spinal stenosis. MRI THORACIC SPINE IMPRESSION: 1. Severely limited exam due to extensive motion artifact. 2. Grossly normal MRI appearance of the thoracic spinal cord. No definite cord signal abnormality to suggest edema or myelomalacia. No significant spinal stenosis or neural impingement. 3. Suspected small central disc protrusion at T9-10 without significant stenosis. 4. Large layering bilateral pleural effusions, right greater than left. MRI LUMBAR SPINE IMPRESSION: 1. Severely limited exam due to motion artifact and nearly nondiagnostic exam. 2. No acute abnormality within the lumbar spine. No significant spinal stenosis or evidence for neural impingement. 3. Minimal disc bulging at L3-4 and L4-5 without significant stenosis. Electronically Signed   By: Jeannine Boga M.D.   On: 10/01/2020 16:19   MR LUMBAR SPINE WO CONTRAST  Result Date: 10/01/2020 CLINICAL DATA:  Initial evaluation for unsteady gait, slurred speech, myelopathy. EXAM: MRI CERVICAL, THORACIC AND LUMBAR SPINE WITHOUT CONTRAST TECHNIQUE: Multiplanar and multiecho pulse sequences of the cervical spine, to include the craniocervical junction and cervicothoracic junction, and  thoracic and lumbar spine, were obtained without intravenous contrast. COMPARISON:  None  available. FINDINGS: MRI CERVICAL SPINE FINDINGS Alignment: Examination severely degraded by motion artifact, and is nearly nondiagnostic. Straightening of the normal cervical lordosis.  No listhesis. Vertebrae: Vertebral body height maintained without visible acute or chronic fracture. Bone marrow signal intensity grossly within normal limits. No visible discrete or worrisome osseous lesions. No abnormal marrow edema. Cord: Signal intensity within the cervical spinal cord is grossly within normal limits. Grossly normal cord caliber and morphology. Posterior Fossa, vertebral arteries, paraspinal tissues: Visualized brain and posterior fossa within normal limits. Craniocervical junction normal. Paraspinous and prevertebral soft tissues grossly within normal limits. Vertebral artery flow voids grossly maintained. Disc levels: C2-C3: Unremarkable. C3-C4: Mild disc bulge. No definite spinal stenosis. Foramina are grossly patent. C4-C5: Mild disc bulge. No more than mild spinal stenosis. Foramina remain grossly patent. C5-C6: Mild diffuse disc bulge. No more than mild spinal stenosis. Foramina are grossly patent. C6-C7:  Grossly negative. C7-T1:  Grossly negative. MRI THORACIC SPINE FINDINGS Alignment: Examination severely degraded by motion artifact and nearly nondiagnostic. Alignment within normal limits with preservation of the normal thoracic kyphosis. No listhesis. Vertebrae: Vertebral body height maintained without acute or chronic fracture. Bone marrow signal intensity grossly within normal limits. No visible discrete or worrisome osseous lesions or abnormal marrow edema. Cord: Signal intensity within the thoracic spinal cord is grossly within normal limits. Grossly normal cord caliber and morphology. Mild diffuse prominence of the dorsal epidural fat noted. Paraspinal and other soft tissues: Paraspinous soft tissues demonstrate no obvious abnormality. Large layering bilateral pleural effusions noted, right larger  than left. Disc levels: T9-10: Suspected small central disc protrusion mildly indents the ventral thecal sac (series 21, image 32). Minimal flattening of the ventral cord without cord signal changes or significant spinal stenosis. No other appreciable or significant disc pathology seen within the thoracic spine. No stenosis or neural impingement. MRI LUMBAR SPINE FINDINGS Segmentation: Examination severely degraded by motion artifact and nearly nondiagnostic. Standard segmentation. Lowest well-formed disc space labeled the L5-S1 level. Alignment: Physiologic with preservation of the normal lumbar lordosis. No listhesis. Vertebrae: Vertebral body height maintained without acute or chronic fracture. Bone marrow signal intensity grossly within normal limits. No visible worrisome osseous lesions. No abnormal marrow edema. Conus medullaris and cauda equina: Conus extends to the L1 level. Conus and cauda equina appear grossly within normal limits. Paraspinal and other soft tissues: Paraspinous soft tissues demonstrate no obvious abnormality. Disc levels: L1-2:  Unremarkable. L2-3:  Unremarkable. L3-4: Minimal annular disc bulge. No spinal stenosis. Foramina appear grossly patent. L4-5: Minimal disc bulge. No significant spinal stenosis. Foramina remain grossly patent. L5-S1:  Unremarkable. IMPRESSION: MRI CERVICAL SPINE IMPRESSION: 1. Severely limited exam due to extensive motion artifact. 2. Grossly normal MRI appearance of the cervical spinal cord. Mild noncompressive disc bulging at C3-4 through C5-6 with no more than mild spinal stenosis. MRI THORACIC SPINE IMPRESSION: 1. Severely limited exam due to extensive motion artifact. 2. Grossly normal MRI appearance of the thoracic spinal cord. No definite cord signal abnormality to suggest edema or myelomalacia. No significant spinal stenosis or neural impingement. 3. Suspected small central disc protrusion at T9-10 without significant stenosis. 4. Large layering bilateral  pleural effusions, right greater than left. MRI LUMBAR SPINE IMPRESSION: 1. Severely limited exam due to motion artifact and nearly nondiagnostic exam. 2. No acute abnormality within the lumbar spine. No significant spinal stenosis or evidence for neural impingement. 3. Minimal disc bulging at L3-4 and L4-5  without significant stenosis. Electronically Signed   By: Jeannine Boga M.D.   On: 10/01/2020 16:19   US Abdomen Complete  Result Date: 10/08/2020 CLINICAL DATA:  Right upper quadrant tenderness. EXAM: ABDOMEN ULTRASOUND COMPLETE COMPARISON:  X-ray abdomen 09/27/2020, chest x-ray 10/13/2020 FINDINGS: Gallbladder: Multiple tiny stones noted within the gallbladder lumen. Trace pericholecystic fluid. No gallbladder wall thickening visualized. No sonographic Murphy sign noted by sonographer; however, patient is sedated. Common bile duct: Diameter: 4 mm. Liver: No focal lesion identified. Within normal limits in parenchymal echogenicity. Portal vein is patent on color Doppler imaging with normal direction of blood flow towards the liver. IVC: No abnormality visualized. Pancreas: Visualized portion unremarkable. Spleen: Size and appearance within normal limits. Right Kidney: Length: 9.8 cm. Echogenicity is increased. No mass or hydronephrosis visualized. Left Kidney: Length: 10.2 cm. Echogenicity is increased. No mass or hydronephrosis visualized. Abdominal aorta: No aneurysm visualized. Other findings: At least small volume left and small to moderate volume right pleural effusions. Trace ascites. IMPRESSION: 1. Cholelithiasis with no acute cholecystitis or choledocholithiasis. 2. Bilateral pleural effusions, right greater than left. 3. Trace ascites. 4. Renal parenchymal disease. 5. Otherwise unremarkable upper abdominal ultrasound. Electronically Signed   By: Iven Finn M.D.   On: 10/08/2020 02:30   US RENAL  Result Date: 09/30/2020 CLINICAL DATA:  Renal failure. EXAM: RENAL / URINARY TRACT  ULTRASOUND COMPLETE COMPARISON:  None. FINDINGS: Right Kidney: Renal measurements: 10.8 x 4.4 x 5.8 cm = volume: 143 mL. No hydronephrosis. Parenchymal echogenicity appears diffusely increased. Left Kidney: Renal measurements: 10.6 x 5.9 x 6.0 cm = volume: 196 mL. No hydronephrosis. Diffusely increased parenchymal echogenicity. Bladder: Unremarkable although there are some dependent low level internal echoes or debris. Other: Bilateral pleural effusions noted with apparent trace Peri renal edema bilaterally. IMPRESSION: 1. No evidence for hydronephrosis. 2. Increased echogenicity of both kidneys suggest medical renal disease. 3. Bilateral pleural effusions with probable mild perinephric edema bilaterally. Electronically Signed   By: Misty Stanley M.D.   On: 09/30/2020 10:11   DG CHEST PORT 1 VIEW  Result Date: 10/14/2020 CLINICAL DATA:  Intubated, respiratory failure EXAM: PORTABLE CHEST 1 VIEW COMPARISON:  10/14/2020 at 3:18 a.m. FINDINGS: Single frontal view of the chest demonstrates endotracheal tube overlying tracheal air column, tip midway between thoracic inlet and carina. Enteric catheter passes below diaphragm tip excluded by collimation with side port projecting over gastric body. Cardiac silhouette is stable. Veiling opacity left lung base unchanged. Stable small right pleural effusion. No pneumothorax. No acute bony abnormalities. IMPRESSION: 1. Support devices as above, with no evidence of complication after intubation. 2. Persistent left basilar consolidation and/or effusion. Stable right pleural effusion. Electronically Signed   By: Randa Ngo M.D.   On: 10/14/2020 16:14   DG CHEST PORT 1 VIEW  Result Date: 10/14/2020 CLINICAL DATA:  Respiratory failure. EXAM: PORTABLE CHEST 1 VIEW COMPARISON:  10/13/2020. FINDINGS: Endotracheal tube and NG tube in stable position. Heart size stable. Bilateral pulmonary infiltrates/edema with bilateral pleural effusions noted on today's exam. Previously  identified dense infiltrate right lung base has resolved. No pneumothorax. IMPRESSION: 1. Lines and tubes in stable position. 2. Bilateral pulmonary infiltrates/edema with bilateral pleural effusions noted on today's exam. Electronically Signed   By: Marcello Moores  Register   On: 10/14/2020 05:08   DG CHEST PORT 1 VIEW  Result Date: 10/13/2020 CLINICAL DATA:  Pneumonia. EXAM: PORTABLE CHEST 1 VIEW COMPARISON:  10/12/2020. FINDINGS: Endotracheal tube, NG tube in stable position. Heart size stable. Persistent dense consolidation right  lower lobe. Bilateral interstitial prominence noted. Bilateral pleural effusions, right side greater than left. No pneumothorax. IMPRESSION: 1. Lines and tubes in stable position. 2. Persistent dense consolidation right lower lobe. Bilateral interstitial prominence and bilateral pleural effusions, right side greater than left. Electronically Signed   By: Marcello Moores  Register   On: 10/13/2020 05:05   DG Chest Port 1 View  Result Date: 10/12/2020 CLINICAL DATA:  Respiratory failure EXAM: PORTABLE CHEST 1 VIEW COMPARISON:  Yesterday FINDINGS: Enteric tube at least reaches the stomach. Endotracheal tube with tip just below the clavicular heads. More hazy asymmetric opacity at the right base. There may be vascular congestion. Normal heart size. No visible pneumothorax. IMPRESSION: 1. Partially reinflated right lower lung. 2. Presumed superimposed right pleural effusion. 3. Unremarkable hardware. Electronically Signed   By: Monte Fantasia M.D.   On: 10/12/2020 04:59   DG Chest Port 1 View  Result Date: 10/11/2020 CLINICAL DATA:  Respiratory failure EXAM: PORTABLE CHEST 1 VIEW COMPARISON:  Yesterday FINDINGS: Dense right-sided opacity from pulmonary disease and large pleural effusion. No interval progression. Endotracheal tube tip is between the clavicular heads and carina. The enteric tube at least reaches the stomach. Normal heart size IMPRESSION: 1. Unchanged right pleural effusion and  overlying pulmonary opacity. 2. Stable hardware. Electronically Signed   By: Monte Fantasia M.D.   On: 10/11/2020 08:59   DG Chest Port 1 View  Result Date: 10/10/2020 CLINICAL DATA:  Stroke symptoms. EXAM: PORTABLE CHEST 1 VIEW COMPARISON:  10/09/20. FINDINGS: ET tube tip is above the carina. There is a NG tube with tip below the GE junction. Stable cardiomediastinal contours. Left lung appears clear. Interval worsening aeration to the right mid and right lower lung which may reflect reaccumulation of right pleural effusion and or progressive atelectasis of the right middle lobe and right lower lobe. IMPRESSION: Worsening aeration to the right mid and lower lung which may reflect reaccumulation of right pleural effusion and/or progressive atelectasis of the right middle lobe and right lower lobe. Electronically Signed   By: Kerby Moors M.D.   On: 10/10/2020 07:24   DG Chest Port 1 View  Result Date: 10/09/2020 CLINICAL DATA:  Respiratory failure. EXAM: PORTABLE CHEST 1 VIEW COMPARISON:  Chest x-ray 10/09/2020, CT chest 09/29/2020 FINDINGS: Endotracheal tube with tip terminating 6 cm above the carina. Interval placement of enteric tube coursing below the hemidiaphragm with tip and side port collimated off view. The heart size and mediastinal contours are unchanged with persistent cardiomegaly. No focal consolidation. No pulmonary edema. No interval increase in size of a moderate right pleural effusion. The left costophrenic angle is collimated off view with suggestion of at least a trace pleural effusion. Increased interstitial markings as well as patchy airspace opacities are again noted. No pneumothorax. No acute osseous abnormality. IMPRESSION: 1. Interval increase of a moderate right pleural effusion. 2. Possible trace left pleural effusion with left costophrenic angle collimated off view. 3. Persistent multifocal interstitial and airspace opacities. 4. Interval placement enteric tube coursing below  the hemidiaphragm with tip and side port collimated off view. Endotracheal tube in good position. Electronically Signed   By: Iven Finn M.D.   On: 10/09/2020 05:43   DG CHEST PORT 1 VIEW  Result Date: 10/16/2020 CLINICAL DATA:  Cardiac arrest EXAM: PORTABLE CHEST 1 VIEW COMPARISON:  10/05/2020 FINDINGS: Single frontal view of the chest demonstrates endotracheal tube overlying tracheal air column, tip midway between thoracic inlet and carina. External defibrillator pads overlie the heart. Cardiac silhouette is  stable. Diffuse bilateral ground-glass airspace disease, right greater than left, favor edema given history of cardiac arrest. There are bilateral pleural effusions, right greater than left. Left effusion is decreased since prior exam. No evidence of pneumothorax. No acute bony abnormalities. IMPRESSION: 1. Endotracheal tube as above. 2. Widespread bilateral airspace disease, greatest at the right lung base. Findings most likely reflect pulmonary edema given history of cardiac arrest, though superimposed infection, hemorrhage, or even aspiration cannot be excluded. 3. Bilateral pleural effusions, right greater than left. Electronically Signed   By: Randa Ngo M.D.   On: 10/10/2020 23:03   DG Chest Port 1 View  Result Date: 10/05/2020 CLINICAL DATA:  Post thoracentesis EXAM: PORTABLE CHEST 1 VIEW COMPARISON:  04/29/2021 FINDINGS: Decreasing right pleural effusion since prior study following thoracentesis. Small residual right pleural effusion. No pneumothorax. Left pleural effusion, enlarged since prior chest x-ray. Left base atelectasis or infiltrate. Heart is normal size. IMPRESSION: Decreasing right effusion following thoracentesis. No pneumothorax. Small residual right pleural effusion and new layering left pleural effusion. Left base atelectasis or infiltrate. Electronically Signed   By: Rolm Baptise M.D.   On: 10/05/2020 10:02   DG Chest Portable 1 View  Result Date:  09/29/2020 CLINICAL DATA:  Rhonchi, smoker, hypertension EXAM: PORTABLE CHEST 1 VIEW COMPARISON:  None. FINDINGS: Normal heart size. Normal mediastinal contour. No pneumothorax. Small to moderate right pleural effusion. No left pleural effusion. Patchy right perihilar and lung base opacity. IMPRESSION: Small to moderate right pleural effusion. Patchy right perihilar and lung base opacity. Findings could represent pneumonia with parapneumonic effusion in the correct clinical setting. Suggest chest radiograph follow-up to resolution. If clinical presentation is not suggestive of pneumonia, consider chest CT with IV contrast for further characterization. Electronically Signed   By: Ilona Sorrel M.D.   On: 09/29/2020 14:57   DG Abd Portable 1V  Result Date: 10/08/2020 CLINICAL DATA:  OG tube placement EXAM: PORTABLE ABDOMEN - 1 VIEW COMPARISON:  09/27/2020 FINDINGS: OG tube is in place with the tip in the stomach. Oral contrast material noted within the transverse colon. IMPRESSION: OG tube tip in the stomach. Electronically Signed   By: Rolm Baptise M.D.   On: 10/08/2020 12:06   ECHOCARDIOGRAM COMPLETE BUBBLE STUDY  Result Date: 10/01/2020    ECHOCARDIOGRAM REPORT   Patient Name:   ALFONZIA WOOLUM Date of Exam: 09/30/2020 Medical Rec #:  [id number]     Height:       71.0 in Accession #:    [id number]    Weight:       170.0 lb Date of Birth:  Jan 23, 1966     BSA:          1.968 m Patient Age:    1 years      BP:           171/93 mmHg Patient Gender: M             HR:           88 bpm. Exam Location:  [facility] Procedure: 2D Echo, Color Doppler, Cardiac Doppler, Strain Analysis and Saline            Contrast Bubble Study Indications:     I63.9 Stroke  History:         Patient has no prior history of Echocardiogram examinations.                  Risk Factors:Hypertension and Diabetes.  Sonographer:     Charmayne Sheer RDCS (AE)  Referring Phys:  [id number] [place] Diagnosing Phys: Yolonda Kida MD  Sonographer Comments:  Global longitudinal strain was attempted. IMPRESSIONS  1. Negative bubble study.  2. Left ventricular ejection fraction, by estimation, is 55 to 60%. The left ventricle has normal function. The left ventricle has no regional wall motion abnormalities. The left ventricular internal cavity size was mildly dilated. There is mild concentric left ventricular hypertrophy. Left ventricular diastolic parameters are consistent with Grade I diastolic dysfunction (impaired relaxation).  3. Right ventricular systolic function is normal. The right ventricular size is normal.  4. The mitral valve is normal in structure. Trivial mitral valve regurgitation.  5. The aortic valve is normal in structure. Aortic valve regurgitation is not visualized.  6. Aortic dilatation noted and Mild dilatation. FINDINGS  Left Ventricle: Left ventricular ejection fraction, by estimation, is 55 to 60%. The left ventricle has normal function. The left ventricle has no regional wall motion abnormalities. The left ventricular internal cavity size was mildly dilated. There is  mild concentric left ventricular hypertrophy. Left ventricular diastolic parameters are consistent with Grade I diastolic dysfunction (impaired relaxation). Right Ventricle: The right ventricular size is normal. No increase in right ventricular wall thickness. Right ventricular systolic function is normal. Left Atrium: Left atrial size was normal in size. Right Atrium: Right atrial size was normal in size. Pericardium: Trivial pericardial effusion is present. Mitral Valve: The mitral valve is normal in structure. There is mild thickening of the mitral valve leaflet(s). Trivial mitral valve regurgitation. MV peak gradient, 6.7 mmHg. The mean mitral valve gradient is 2.0 mmHg. Tricuspid Valve: The tricuspid valve is normal in structure. Tricuspid valve regurgitation is trivial. Aortic Valve: The aortic valve is normal in structure. Aortic valve regurgitation is not visualized. Aortic  valve mean gradient measures 2.0 mmHg. Aortic valve peak gradient measures 2.9 mmHg. Aortic valve area, by VTI measures 2.60 cm. Pulmonic Valve: The pulmonic valve was normal in structure. Pulmonic valve regurgitation is not visualized. Aorta: Aortic dilatation noted and Mild dilatation. IAS/Shunts: No atrial level shunt detected by color flow Doppler. Agitated saline contrast was given intravenously to evaluate for intracardiac shunting. Additional Comments: Negative bubble study.  LEFT VENTRICLE PLAX 2D LVIDd:         4.20 cm  Diastology LVIDs:         2.90 cm  LV e' medial:    4.24 cm/s LV PW:         1.50 cm  LV E/e' medial:  23.6 LV IVS:        1.20 cm  LV e' lateral:   4.79 cm/s LVOT diam:     2.00 cm  LV E/e' lateral: 20.9 LV SV:         39 LV SV Index:   20 LVOT Area:     3.14 cm  RIGHT VENTRICLE RV Basal diam:  3.40 cm LEFT ATRIUM             Index       RIGHT ATRIUM           Index LA diam:        4.30 cm 2.19 cm/m  RA Area:     17.60 cm LA Vol (A2C):   84.5 ml 42.94 ml/m RA Volume:   49.60 ml  25.21 ml/m LA Vol (A4C):   57.3 ml 29.12 ml/m LA Biplane Vol: 70.6 ml 35.88 ml/m  AORTIC VALVE  PULMONIC VALVE AV Area (Vmax):    2.48 cm    PV Vmax:       0.76 m/s AV Area (Vmean):   2.30 cm    PV Vmean:      53.200 cm/s AV Area (VTI):     2.60 cm    PV VTI:        0.128 m AV Vmax:           85.10 cm/s  PV Peak grad:  2.3 mmHg AV Vmean:          61.900 cm/s PV Mean grad:  1.0 mmHg AV VTI:            0.150 m AV Peak Grad:      2.9 mmHg AV Mean Grad:      2.0 mmHg LVOT Vmax:         67.20 cm/s LVOT Vmean:        45.400 cm/s LVOT VTI:          0.124 m LVOT/AV VTI ratio: 0.83  AORTA Ao Root diam: 4.10 cm MITRAL VALVE                TRICUSPID VALVE MV Area (PHT): 4.60 cm     TR Peak grad:   53.6 mmHg MV Area VTI:   1.85 cm     TR Vmax:        366.00 cm/s MV Peak grad:  6.7 mmHg MV Mean grad:  2.0 mmHg     SHUNTS MV Vmax:       1.29 m/s     Systemic VTI:  0.12 m MV Vmean:      72.9 cm/s     Systemic Diam: 2.00 cm MV Decel Time: 165 msec MV E velocity: 100.00 cm/s MV A velocity: 75.00 cm/s MV E/A ratio:  1.33 Dwayne D Callwood MD Electronically signed by Yolonda Kida MD Signature Date/Time: 10/01/2020/7:42:01 AM    Final    US THORACENTESIS ASP PLEURAL SPACE W/IMG GUIDE  Result Date: 10/05/2020 INDICATION: Patient with history of hypertension and diabetes. Presented to the ED with unsteady gait and frequent falls. Incidental finding of bilateral pleural effusions. Right greater than left. Patient presents for therapeutic and diagnostic thoracentesis EXAM: ULTRASOUND GUIDED THERAPEUTIC AND DIAGNOSTIC THORACENTESIS MEDICATIONS: Lidocaine 1% 10 mL COMPLICATIONS: None immediate. PROCEDURE: An ultrasound guided thoracentesis was thoroughly discussed with the patient and questions answered. The benefits, risks, alternatives and complications were also discussed. The patient understands and wishes to proceed with the procedure. Written consent was obtained. Ultrasound was performed to localize and mark an adequate pocket of fluid in the right chest. The area was then prepped and draped in the normal sterile fashion. 1% Lidocaine was used for local anesthesia. Under ultrasound guidance a 6 Fr Safe-T-Centesis catheter was introduced. Thoracentesis was performed. The catheter was removed and a dressing applied. FINDINGS: A total of approximately 2.2 L of straw-colored fluid was removed. Samples were sent to the laboratory as requested by the clinical team. IMPRESSION: Successful ultrasound guided therapeutic and diagnostic right-sided thoracentesis yielding 2.2 L of pleural fluid. Read by: Rushie Nyhan, NP Electronically Signed   By: Miachel Roux M.D.   On: 10/05/2020 10:14      Thoracentesis    Subjective: Pt up in chair today.  Says he wants to go home, no longer feels he needs to go to CIR.  Walking very well.  Denies trouble swallowing and feels speech improving.  No other acute  complaints.  Discharge Exam: Vitals:   10/06/20 0731 10/06/20 1120  BP: (!) 152/103 110/74  Pulse: 70 (!) 58  Resp: 16 16  Temp:  98 F (36.7 C)  SpO2: 100% 100%   Vitals:   10/06/20 0140 10/06/20 0351 10/06/20 0731 10/06/20 1120  BP: 112/86 130/81 (!) 152/103 110/74  Pulse: 77 64 70 (!) 58  Resp: _0 Temp: 98.2 F (36.8 C) 97.8 F (36.6 C)  98 F (36.7 C)  TempSrc: Oral     SpO2: 98% 100% 100% 100%  Weight:      Height:        General: Pt is alert, awake, not in acute distress Cardiovascular: RRR, S1/S2 +, no rubs, no gallops Respiratory: CTA bilaterally, no wheezing, no rhonchi Abdominal: Soft, NT, ND, bowel sounds + Extremities: no edema, no cyanosis    The results of significant diagnostics from this hospitalization (including imaging, microbiology, ancillary and laboratory) are listed below for reference.     Microbiology: Recent Results (from the past 240 hour(s))  Body fluid culture w Gram Stain     Status: None   Collection Time: 10/05/20  9:00 AM   Specimen: Pleura  Result Value Ref Range Status   Specimen Description   Final    PLEURAL Performed at [facility], [address] [place], [place] [address]    Special Requests   Final    NONE Performed at [facility], [address] [place], [place] [address]    Gram Stain   Final    FEW WBC PRESENT, PREDOMINANTLY MONONUCLEAR NO ORGANISMS SEEN    Culture   Final    NO GROWTH 3 DAYS Performed at [facility], [place] [address], [place], [place] [address]    Report Status 10/08/2020 FINAL  Final  MRSA PCR Screening     Status: None   Collection Time: 10/06/2020 10:44 PM   Specimen: Nasal Mucosa; Nasopharyngeal  Result Value Ref Range Status   MRSA by PCR NEGATIVE NEGATIVE Final    Comment:        The GeneXpert MRSA Assay (FDA approved for NASAL specimens only), is one component of a comprehensive MRSA colonization surveillance program. It is not intended  to diagnose MRSA infection nor to guide or monitor treatment for MRSA infections. Performed at [facility], [place] [address] [place], [place] [address]   Culture, Respiratory w Gram Stain     Status: None   Collection Time: 10/08/20 11:33 AM   Specimen: Endotracheal; Respiratory  Result Value Ref Range Status   Specimen Description ENDOTRACHEAL  Final   Special Requests NONE  Final   Gram Stain   Final    MODERATE WBC PRESENT, PREDOMINANTLY PMN RARE BUDDING YEAST SEEN Performed at [facility], [place] [address] [place], [place] [address]    Culture RARE CANDIDA ALBICANS  Final   Report Status 10/10/2020 FINAL  Final  Culture, BAL-quantitative w Gram Stain     Status: Abnormal   Collection Time: 10/11/20 10:51 AM   Specimen: Bronchoalveolar Lavage; Respiratory  Result Value Ref Range Status   Specimen Description BRONCHIAL ALVEOLAR LAVAGE  Final   Special Requests Normal  Final   Gram Stain   Final    ABUNDANT WBC PRESENT, PREDOMINANTLY PMN NO ORGANISMS SEEN Performed at [facility], [address], [place],  [address]    Culture 60,000 COLONIES/mL CANDIDA ALBICANS (A)  Final   Report Status 10/14/2020 FINAL  Final     Labs: BNP (last 3 results) Recent Labs    09/29/20 1435  BNP 2,633.3*   Basic Metabolic Panel: Recent Labs  Lab 10/09/20 0148 10/10/20 0349 10/11/20 0618 10/12/20 0226 10/13/20 0225 10/14/20 0751  NA 139 140 144 145 146* 148*  K 3.8 4.0 4.1 4.1 4.1 4.4  CL 108 106 107 108 112* 113*  CO2 _0 GLUCOSE 121* 168* 153* 140* 151* 146*  BUN 46* 48* 51* 53* 53* 54*  CREATININE 2.70* 2.87* 2.97* 3.04* 3.00* 2.91*  CALCIUM 6.9* 7.2* 7.6* 7.6* 7.9* 8.0*  MG 1.9 1.7 1.7  --  1.9 2.0  PHOS 3.8  --   --   --   --   --    Liver Function Tests: Recent Labs  Lab 10/08/20 0106 10/09/20 0148 10/11/20 0618 10/13/20 0225 10/14/20 0751  AST 164* 53* 71* 73* 95*  ALT 109* 68* 80* 69* 77*  ALKPHOS 139* 114 288* 402*  435*  BILITOT 0.5 1.0 0.6 0.8 0.8  PROT 4.8* 4.5* 5.2* 5.6* 5.7*  ALBUMIN 1.5* 1.5* 1.4* 1.3* 1.3*   Recent Labs  Lab 10/08/20 0106  LIPASE 55*  AMYLASE 130*   No results for input(s): AMMONIA in the last 168 hours. CBC: Recent Labs  Lab 10/10/20 0349 10/11/20 0618 10/12/20 0226 10/13/20 0225 10/14/20 0751  WBC 15.1* 13.9* 9.9 9.0 7.4  HGB 10.5* 9.8* 9.9* 10.1* 9.8*  HCT 31.5* 29.4* 30.6* 30.8* 30.7*  MCV 83.3 82.4 83.4 83.2 84.1  PLT 123* 164 183 224 259   Cardiac Enzymes: No results for input(s): CKTOTAL, CKMB, CKMBINDEX, TROPONINI in the last 168 hours. BNP: Invalid input(s): POCBNP CBG: Recent Labs  Lab 10/13/20 2339 10/14/20 0328 10/14/20 0739 10/14/20 1208 10/14/20 1559  GLUCAP 166* 115* 133* 155* 128*   D-Dimer No results for input(s): DDIMER in the last 72 hours. Hgb A1c No results for input(s): HGBA1C in the last 72 hours. Lipid Profile Recent Labs    10/14/20 0751  TRIG 65   Thyroid function studies No results for input(s): TSH, T4TOTAL, T3FREE, THYROIDAB in the last 72 hours.  Invalid input(s): FREET3 Anemia work up No results for input(s): VITAMINB12, FOLATE, FERRITIN, TIBC, IRON, RETICCTPCT in the last 72 hours. Urinalysis    Component Value Date/Time   COLORURINE YELLOW (A) 09/29/2020 1924   APPEARANCEUR CLOUDY (A) 09/29/2020 1924   LABSPEC 1.013 09/29/2020 1924   PHURINE 5.0 09/29/2020 1924   GLUCOSEU 50 (A) 09/29/2020 1924   HGBUR SMALL (A) 09/29/2020 1924   BILIRUBINUR NEGATIVE 09/29/2020 1924   KETONESUR NEGATIVE 09/29/2020 1924   PROTEINUR >=300 (A) 09/29/2020 1924   NITRITE NEGATIVE 09/29/2020 1924   LEUKOCYTESUR SMALL (A) 09/29/2020 1924   Sepsis Labs Invalid input(s): PROCALCITONIN,  WBC,  LACTICIDVEN Microbiology Recent Results (from the past 240 hour(s))  Body fluid culture w Gram Stain     Status: None   Collection Time: 10/05/20  9:00 AM   Specimen: Pleura  Result Value Ref Range Status   Specimen Description    Final    PLEURAL Performed at [facility], [address], [place], [place] [address]    Special Requests   Final    NONE Performed at [facility], [address] [place], [place] [address]    Gram Stain   Final    FEW WBC PRESENT, PREDOMINANTLY MONONUCLEAR NO ORGANISMS SEEN    Culture   Final    NO GROWTH 3  DAYS Performed at [facility], [place] [address], [place], [place] [address]    Report Status 10/08/2020 FINAL  Final  MRSA PCR Screening     Status: None   Collection Time: 10/03/2020 10:44 PM   Specimen: Nasal Mucosa; Nasopharyngeal  Result Value Ref Range Status   MRSA by PCR NEGATIVE NEGATIVE Final    Comment:        The GeneXpert MRSA Assay (FDA approved for NASAL specimens only), is one component of a comprehensive MRSA colonization surveillance program. It is not intended to diagnose MRSA infection nor to guide or monitor treatment for MRSA infections. Performed at [facility], [place] [address] [place], [place] [address]   Culture, Respiratory w Gram Stain     Status: None   Collection Time: 10/08/20 11:33 AM   Specimen: Endotracheal; Respiratory  Result Value Ref Range Status   Specimen Description ENDOTRACHEAL  Final   Special Requests NONE  Final   Gram Stain   Final    MODERATE WBC PRESENT, PREDOMINANTLY PMN RARE BUDDING YEAST SEEN Performed at [facility], [place] [address] [place], [place] [address]    Culture RARE CANDIDA ALBICANS  Final   Report Status 10/10/2020 FINAL  Final  Culture, BAL-quantitative w Gram Stain     Status: Abnormal   Collection Time: 10/11/20 10:51 AM   Specimen: Bronchoalveolar Lavage; Respiratory  Result Value Ref Range Status   Specimen Description BRONCHIAL ALVEOLAR LAVAGE  Final   Special Requests Normal  Final   Gram Stain   Final    ABUNDANT WBC PRESENT, PREDOMINANTLY PMN NO ORGANISMS SEEN Performed at [facility], [address] [place],  [address]    Culture  60,000 COLONIES/mL CANDIDA ALBICANS (A)  Final   Report Status 10/14/2020 FINAL  Final     Time coordinating discharge: Over 30 minutes  SIGNED:   Ezekiel Slocumb, DO [facility] 10/14/2020, 6:04 PM   If 7PM-7AM, please contact night-coverage [email or web address]

## 2020-10-06 NOTE — Progress Notes (Signed)
Patient is stable and ready for discharge home. Patient removed his own IV area intact. Writer went over discharge paperwork with patient and he verbalized understanding and that he needs to take his prescriptions to be filled. Also patient was provided with a home BP monitor by the MD to monitor his BP at home. Patient's significant other here at bedside with patient taking him home today. Discharge paperwork went over with her also and she knows to take his hard script for his medications to the pharmacy to be filled and she understands she needs to make his follow up appt.

## 2020-10-07 ENCOUNTER — Encounter (HOSPITAL_COMMUNITY): Payer: Self-pay | Admitting: Internal Medicine

## 2020-10-07 ENCOUNTER — Inpatient Hospital Stay (HOSPITAL_COMMUNITY): Payer: 59 | Admitting: Certified Registered Nurse Anesthetist

## 2020-10-07 ENCOUNTER — Inpatient Hospital Stay (HOSPITAL_COMMUNITY)
Admission: EM | Admit: 2020-10-07 | Discharge: 2020-11-20 | DRG: 853 | Disposition: E | Payer: 59 | Attending: Pulmonary Disease | Admitting: Pulmonary Disease

## 2020-10-07 ENCOUNTER — Emergency Department (HOSPITAL_COMMUNITY): Payer: 59

## 2020-10-07 ENCOUNTER — Inpatient Hospital Stay (HOSPITAL_COMMUNITY): Payer: 59

## 2020-10-07 ENCOUNTER — Other Ambulatory Visit: Payer: Self-pay

## 2020-10-07 DIAGNOSIS — E43 Unspecified severe protein-calorie malnutrition: Secondary | ICD-10-CM | POA: Diagnosis present

## 2020-10-07 DIAGNOSIS — A419 Sepsis, unspecified organism: Secondary | ICD-10-CM | POA: Diagnosis present

## 2020-10-07 DIAGNOSIS — J9600 Acute respiratory failure, unspecified whether with hypoxia or hypercapnia: Secondary | ICD-10-CM

## 2020-10-07 DIAGNOSIS — J90 Pleural effusion, not elsewhere classified: Secondary | ICD-10-CM

## 2020-10-07 DIAGNOSIS — Z7984 Long term (current) use of oral hypoglycemic drugs: Secondary | ICD-10-CM

## 2020-10-07 DIAGNOSIS — I5033 Acute on chronic diastolic (congestive) heart failure: Secondary | ICD-10-CM | POA: Diagnosis present

## 2020-10-07 DIAGNOSIS — N1831 Chronic kidney disease, stage 3a: Secondary | ICD-10-CM | POA: Diagnosis not present

## 2020-10-07 DIAGNOSIS — E1165 Type 2 diabetes mellitus with hyperglycemia: Secondary | ICD-10-CM | POA: Diagnosis not present

## 2020-10-07 DIAGNOSIS — Y848 Other medical procedures as the cause of abnormal reaction of the patient, or of later complication, without mention of misadventure at the time of the procedure: Secondary | ICD-10-CM | POA: Diagnosis not present

## 2020-10-07 DIAGNOSIS — J690 Pneumonitis due to inhalation of food and vomit: Secondary | ICD-10-CM | POA: Diagnosis present

## 2020-10-07 DIAGNOSIS — R6521 Severe sepsis with septic shock: Secondary | ICD-10-CM | POA: Diagnosis present

## 2020-10-07 DIAGNOSIS — R262 Difficulty in walking, not elsewhere classified: Secondary | ICD-10-CM | POA: Diagnosis present

## 2020-10-07 DIAGNOSIS — J9601 Acute respiratory failure with hypoxia: Secondary | ICD-10-CM | POA: Diagnosis present

## 2020-10-07 DIAGNOSIS — I130 Hypertensive heart and chronic kidney disease with heart failure and stage 1 through stage 4 chronic kidney disease, or unspecified chronic kidney disease: Secondary | ICD-10-CM | POA: Diagnosis present

## 2020-10-07 DIAGNOSIS — Z4659 Encounter for fitting and adjustment of other gastrointestinal appliance and device: Secondary | ICD-10-CM

## 2020-10-07 DIAGNOSIS — Z79899 Other long term (current) drug therapy: Secondary | ICD-10-CM

## 2020-10-07 DIAGNOSIS — F17210 Nicotine dependence, cigarettes, uncomplicated: Secondary | ICD-10-CM | POA: Diagnosis present

## 2020-10-07 DIAGNOSIS — J189 Pneumonia, unspecified organism: Secondary | ICD-10-CM

## 2020-10-07 DIAGNOSIS — J9690 Respiratory failure, unspecified, unspecified whether with hypoxia or hypercapnia: Secondary | ICD-10-CM

## 2020-10-07 DIAGNOSIS — I9581 Postprocedural hypotension: Secondary | ICD-10-CM | POA: Diagnosis not present

## 2020-10-07 DIAGNOSIS — Z7189 Other specified counseling: Secondary | ICD-10-CM | POA: Diagnosis not present

## 2020-10-07 DIAGNOSIS — Z9889 Other specified postprocedural states: Secondary | ICD-10-CM

## 2020-10-07 DIAGNOSIS — Z880 Allergy status to penicillin: Secondary | ICD-10-CM | POA: Diagnosis not present

## 2020-10-07 DIAGNOSIS — R339 Retention of urine, unspecified: Secondary | ICD-10-CM | POA: Diagnosis present

## 2020-10-07 DIAGNOSIS — R109 Unspecified abdominal pain: Secondary | ICD-10-CM

## 2020-10-07 DIAGNOSIS — N179 Acute kidney failure, unspecified: Secondary | ICD-10-CM | POA: Diagnosis present

## 2020-10-07 DIAGNOSIS — T17500A Unspecified foreign body in bronchus causing asphyxiation, initial encounter: Secondary | ICD-10-CM | POA: Diagnosis not present

## 2020-10-07 DIAGNOSIS — I10 Essential (primary) hypertension: Secondary | ICD-10-CM | POA: Diagnosis present

## 2020-10-07 DIAGNOSIS — I63521 Cerebral infarction due to unspecified occlusion or stenosis of right anterior cerebral artery: Secondary | ICD-10-CM | POA: Diagnosis not present

## 2020-10-07 DIAGNOSIS — F17200 Nicotine dependence, unspecified, uncomplicated: Secondary | ICD-10-CM | POA: Insufficient documentation

## 2020-10-07 DIAGNOSIS — Z7902 Long term (current) use of antithrombotics/antiplatelets: Secondary | ICD-10-CM | POA: Diagnosis not present

## 2020-10-07 DIAGNOSIS — E870 Hyperosmolality and hypernatremia: Secondary | ICD-10-CM | POA: Diagnosis not present

## 2020-10-07 DIAGNOSIS — R1313 Dysphagia, pharyngeal phase: Secondary | ICD-10-CM | POA: Diagnosis present

## 2020-10-07 DIAGNOSIS — Z0189 Encounter for other specified special examinations: Secondary | ICD-10-CM

## 2020-10-07 DIAGNOSIS — L89326 Pressure-induced deep tissue damage of left buttock: Secondary | ICD-10-CM | POA: Diagnosis present

## 2020-10-07 DIAGNOSIS — L89316 Pressure-induced deep tissue damage of right buttock: Secondary | ICD-10-CM | POA: Diagnosis present

## 2020-10-07 DIAGNOSIS — I5031 Acute diastolic (congestive) heart failure: Secondary | ICD-10-CM | POA: Diagnosis not present

## 2020-10-07 DIAGNOSIS — G9341 Metabolic encephalopathy: Secondary | ICD-10-CM | POA: Diagnosis not present

## 2020-10-07 DIAGNOSIS — I6930 Unspecified sequelae of cerebral infarction: Secondary | ICD-10-CM

## 2020-10-07 DIAGNOSIS — E1122 Type 2 diabetes mellitus with diabetic chronic kidney disease: Secondary | ICD-10-CM | POA: Diagnosis present

## 2020-10-07 DIAGNOSIS — Z7982 Long term (current) use of aspirin: Secondary | ICD-10-CM | POA: Diagnosis not present

## 2020-10-07 DIAGNOSIS — Z66 Do not resuscitate: Secondary | ICD-10-CM | POA: Diagnosis not present

## 2020-10-07 DIAGNOSIS — I69391 Dysphagia following cerebral infarction: Secondary | ICD-10-CM | POA: Diagnosis not present

## 2020-10-07 DIAGNOSIS — Z515 Encounter for palliative care: Secondary | ICD-10-CM | POA: Diagnosis not present

## 2020-10-07 DIAGNOSIS — I469 Cardiac arrest, cause unspecified: Secondary | ICD-10-CM

## 2020-10-07 DIAGNOSIS — J95811 Postprocedural pneumothorax: Secondary | ICD-10-CM | POA: Diagnosis not present

## 2020-10-07 DIAGNOSIS — E785 Hyperlipidemia, unspecified: Secondary | ICD-10-CM | POA: Diagnosis present

## 2020-10-07 DIAGNOSIS — E119 Type 2 diabetes mellitus without complications: Secondary | ICD-10-CM | POA: Insufficient documentation

## 2020-10-07 DIAGNOSIS — J9602 Acute respiratory failure with hypercapnia: Secondary | ICD-10-CM

## 2020-10-07 DIAGNOSIS — N1832 Chronic kidney disease, stage 3b: Secondary | ICD-10-CM | POA: Diagnosis present

## 2020-10-07 DIAGNOSIS — Z978 Presence of other specified devices: Secondary | ICD-10-CM

## 2020-10-07 HISTORY — DX: Difficulty in walking, not elsewhere classified: R26.2

## 2020-10-07 HISTORY — DX: Dysphagia following cerebral infarction: I69.391

## 2020-10-07 HISTORY — DX: Nicotine dependence, unspecified, uncomplicated: F17.200

## 2020-10-07 LAB — APTT: aPTT: 36 seconds (ref 24–36)

## 2020-10-07 LAB — DIFFERENTIAL
Abs Immature Granulocytes: 0.02 10*3/uL (ref 0.00–0.07)
Basophils Absolute: 0 10*3/uL (ref 0.0–0.1)
Basophils Relative: 0 %
Eosinophils Absolute: 0.1 10*3/uL (ref 0.0–0.5)
Eosinophils Relative: 1 %
Immature Granulocytes: 0 %
Lymphocytes Relative: 12 %
Lymphs Abs: 0.7 10*3/uL (ref 0.7–4.0)
Monocytes Absolute: 0.2 10*3/uL (ref 0.1–1.0)
Monocytes Relative: 3 %
Neutro Abs: 4.6 10*3/uL (ref 1.7–7.7)
Neutrophils Relative %: 84 %

## 2020-10-07 LAB — CBC
HCT: 39.8 % (ref 39.0–52.0)
Hemoglobin: 12.2 g/dL — ABNORMAL LOW (ref 13.0–17.0)
MCH: 27.2 pg (ref 26.0–34.0)
MCHC: 30.7 g/dL (ref 30.0–36.0)
MCV: 88.8 fL (ref 80.0–100.0)
Platelets: 179 10*3/uL (ref 150–400)
RBC: 4.48 MIL/uL (ref 4.22–5.81)
RDW: 16.8 % — ABNORMAL HIGH (ref 11.5–15.5)
WBC: 5.6 10*3/uL (ref 4.0–10.5)
nRBC: 0 % (ref 0.0–0.2)

## 2020-10-07 LAB — COMPREHENSIVE METABOLIC PANEL
ALT: 33 U/L (ref 0–44)
AST: 45 U/L — ABNORMAL HIGH (ref 15–41)
Albumin: 2 g/dL — ABNORMAL LOW (ref 3.5–5.0)
Alkaline Phosphatase: 109 U/L (ref 38–126)
Anion gap: 8 (ref 5–15)
BUN: 36 mg/dL — ABNORMAL HIGH (ref 6–20)
CO2: 24 mmol/L (ref 22–32)
Calcium: 7.9 mg/dL — ABNORMAL LOW (ref 8.9–10.3)
Chloride: 106 mmol/L (ref 98–111)
Creatinine, Ser: 2.4 mg/dL — ABNORMAL HIGH (ref 0.61–1.24)
GFR, Estimated: 31 mL/min — ABNORMAL LOW (ref >60)
Glucose, Bld: 195 mg/dL — ABNORMAL HIGH (ref 70–99)
Potassium: 4.8 mmol/L (ref 3.5–5.1)
Sodium: 138 mmol/L (ref 135–145)
Total Bilirubin: 0.6 mg/dL (ref 0.3–1.2)
Total Protein: 5.8 g/dL — ABNORMAL LOW (ref 6.5–8.1)

## 2020-10-07 LAB — I-STAT CHEM 8, ED
BUN: 38 mg/dL — ABNORMAL HIGH (ref 6–20)
Calcium, Ion: 1.07 mmol/L — ABNORMAL LOW (ref 1.15–1.40)
Chloride: 105 mmol/L (ref 98–111)
Creatinine, Ser: 2.4 mg/dL — ABNORMAL HIGH (ref 0.61–1.24)
Glucose, Bld: 190 mg/dL — ABNORMAL HIGH (ref 70–99)
HCT: 40 % (ref 39.0–52.0)
Hemoglobin: 13.6 g/dL (ref 13.0–17.0)
Potassium: 4.7 mmol/L (ref 3.5–5.1)
Sodium: 140 mmol/L (ref 135–145)
TCO2: 26 mmol/L (ref 22–32)

## 2020-10-07 LAB — GLUCOSE, CAPILLARY
Glucose-Capillary: 212 mg/dL — ABNORMAL HIGH (ref 70–99)
Glucose-Capillary: 201 mg/dL — ABNORMAL HIGH (ref 70–99)
Glucose-Capillary: 203 mg/dL — ABNORMAL HIGH (ref 70–99)

## 2020-10-07 LAB — PROTIME-INR
INR: 1 (ref 0.8–1.2)
Prothrombin Time: 12.6 seconds (ref 11.4–15.2)

## 2020-10-07 LAB — CBG MONITORING, ED
Glucose-Capillary: 168 mg/dL — ABNORMAL HIGH (ref 70–99)
Glucose-Capillary: 234 mg/dL — ABNORMAL HIGH (ref 70–99)

## 2020-10-07 MED ORDER — ACETAMINOPHEN 650 MG RE SUPP: 650.0000 mg | RECTAL | Status: DC | PRN

## 2020-10-07 MED ORDER — SODIUM CHLORIDE 0.9 % IV SOLN: INTRAVENOUS | Status: DC

## 2020-10-07 MED ORDER — ENOXAPARIN SODIUM 40 MG/0.4ML ~~LOC~~ SOLN
40.0000 mg | SUBCUTANEOUS | Status: DC
  Administered 2020-10-07 – 2020-10-10 (×4): 40 mg via SUBCUTANEOUS
  Filled 2020-10-07 (×4): qty 0.4

## 2020-10-07 MED ORDER — INSULIN ASPART 100 UNIT/ML ~~LOC~~ SOLN
0.0000 [IU] | Freq: Every day | SUBCUTANEOUS | Status: DC
  Administered 2020-10-07: 2 [IU] via SUBCUTANEOUS

## 2020-10-07 MED ORDER — ORAL CARE MOUTH RINSE
15.0000 mL | OROMUCOSAL | Status: DC
  Administered 2020-10-08 – 2020-10-18 (×90): 15 mL via OROMUCOSAL

## 2020-10-07 MED ORDER — FENTANYL CITRATE (PF) 100 MCG/2ML IJ SOLN
25.0000 ug | INTRAMUSCULAR | Status: DC | PRN
  Administered 2020-10-08 – 2020-10-13 (×4): 50 ug via INTRAVENOUS
  Filled 2020-10-07 (×6): qty 2

## 2020-10-07 MED ORDER — PANTOPRAZOLE SODIUM 40 MG IV SOLR: 40.0000 mg | Freq: Every day | INTRAVENOUS | Status: DC

## 2020-10-07 MED ORDER — CLOPIDOGREL BISULFATE 75 MG PO TABS: 75.0000 mg | ORAL_TABLET | Freq: Every day | ORAL | Status: DC

## 2020-10-07 MED ORDER — HYDRALAZINE HCL 50 MG PO TABS
100.0000 mg | ORAL_TABLET | Freq: Three times a day (TID) | ORAL | Status: DC
  Administered 2020-10-07: 100 mg via ORAL
  Filled 2020-10-07: qty 2

## 2020-10-07 MED ORDER — FENTANYL CITRATE (PF) 100 MCG/2ML IJ SOLN: 25.0000 ug | INTRAMUSCULAR | Status: DC | PRN

## 2020-10-07 MED ORDER — PROPOFOL 1000 MG/100ML IV EMUL
INTRAVENOUS | Status: AC
  Filled 2020-10-07: qty 100

## 2020-10-07 MED ORDER — CHLORHEXIDINE GLUCONATE CLOTH 2 % EX PADS
6.0000 | MEDICATED_PAD | Freq: Every day | CUTANEOUS | Status: DC
  Administered 2020-10-07 – 2020-10-19 (×11): 6 via TOPICAL

## 2020-10-07 MED ORDER — NICOTINE 14 MG/24HR TD PT24
14.0000 mg | MEDICATED_PATCH | Freq: Every day | TRANSDERMAL | Status: DC
  Administered 2020-10-08 – 2020-10-10 (×3): 14 mg via TRANSDERMAL
  Filled 2020-10-07 (×3): qty 1

## 2020-10-07 MED ORDER — SODIUM CHLORIDE 0.9% FLUSH: 3.0000 mL | Freq: Once | INTRAVENOUS | Status: DC

## 2020-10-07 MED ORDER — CARVEDILOL 12.5 MG PO TABS
12.5000 mg | ORAL_TABLET | Freq: Two times a day (BID) | ORAL | Status: DC
  Administered 2020-10-08: 12.5 mg via ORAL
  Filled 2020-10-07: qty 1

## 2020-10-07 MED ORDER — CHLORHEXIDINE GLUCONATE 0.12% ORAL RINSE (MEDLINE KIT)
15.0000 mL | Freq: Two times a day (BID) | OROMUCOSAL | Status: DC
  Administered 2020-10-08 – 2020-10-18 (×22): 15 mL via OROMUCOSAL

## 2020-10-07 MED ORDER — SENNOSIDES-DOCUSATE SODIUM 8.6-50 MG PO TABS: 1.0000 | ORAL_TABLET | Freq: Every evening | ORAL | Status: DC | PRN

## 2020-10-07 MED ORDER — AMLODIPINE BESYLATE 10 MG PO TABS: 10.0000 mg | ORAL_TABLET | Freq: Every day | ORAL | Status: DC

## 2020-10-07 MED ORDER — INSULIN ASPART 100 UNIT/ML ~~LOC~~ SOLN
0.0000 [IU] | Freq: Three times a day (TID) | SUBCUTANEOUS | Status: DC
  Administered 2020-10-07: 5 [IU] via SUBCUTANEOUS

## 2020-10-07 MED ORDER — FENTANYL CITRATE (PF) 100 MCG/2ML IJ SOLN
INTRAMUSCULAR | Status: AC
  Administered 2020-10-07: 50 ug via INTRAVENOUS
  Filled 2020-10-07: qty 2

## 2020-10-07 MED ORDER — ACETAMINOPHEN 160 MG/5ML PO SOLN
650.0000 mg | ORAL | Status: DC | PRN
  Administered 2020-10-20: 650 mg
  Filled 2020-10-07: qty 20.3

## 2020-10-07 MED ORDER — ASPIRIN EC 81 MG PO TBEC: 81.0000 mg | DELAYED_RELEASE_TABLET | Freq: Every day | ORAL | Status: DC

## 2020-10-07 MED ORDER — PROPOFOL 1000 MG/100ML IV EMUL
5.0000 ug/kg/min | INTRAVENOUS | Status: DC
  Administered 2020-10-07: 5 ug/kg/min via INTRAVENOUS
  Administered 2020-10-08: 15 ug/kg/min via INTRAVENOUS
  Administered 2020-10-08: 25 ug/kg/min via INTRAVENOUS
  Administered 2020-10-09 – 2020-10-10 (×3): 20 ug/kg/min via INTRAVENOUS
  Administered 2020-10-10: 10 ug/kg/min via INTRAVENOUS
  Administered 2020-10-11 (×2): 20 ug/kg/min via INTRAVENOUS
  Administered 2020-10-12: 15 ug/kg/min via INTRAVENOUS
  Administered 2020-10-12 – 2020-10-13 (×3): 20 ug/kg/min via INTRAVENOUS
  Administered 2020-10-13: 30 ug/kg/min via INTRAVENOUS
  Administered 2020-10-13: 20 ug/kg/min via INTRAVENOUS
  Administered 2020-10-14 – 2020-10-15 (×3): 30 ug/kg/min via INTRAVENOUS
  Filled 2020-10-07 (×11): qty 100
  Filled 2020-10-07: qty 200
  Filled 2020-10-07 (×7): qty 100

## 2020-10-07 MED ORDER — ACETAMINOPHEN 325 MG PO TABS
650.0000 mg | ORAL_TABLET | ORAL | Status: DC | PRN
  Administered 2020-10-20: 650 mg via ORAL
  Filled 2020-10-07: qty 2

## 2020-10-07 MED ORDER — ONDANSETRON HCL 4 MG/2ML IJ SOLN
4.0000 mg | Freq: Four times a day (QID) | INTRAMUSCULAR | Status: DC | PRN
  Administered 2020-10-15: 4 mg via INTRAVENOUS
  Filled 2020-10-07: qty 2

## 2020-10-07 MED ORDER — ATORVASTATIN CALCIUM 40 MG PO TABS
40.0000 mg | ORAL_TABLET | Freq: Every day | ORAL | Status: DC
  Administered 2020-10-07: 40 mg via ORAL
  Filled 2020-10-07: qty 1

## 2020-10-07 NOTE — Code Documentation (Signed)
  Patient Name: Tommy Perez   MRN: [id number]   Date of Birth/ Sex: May 21, 1966 , male      Admission Date: 10/09/2020  Attending Provider: Jonah Blue, MD  Primary Diagnosis: Ambulatory dysfunction   Indication: Pt was in his usual state of health until this PM, when he was noted to be bradycardic->PEA. Code blue was subsequently called. At the time of arrival on scene, ACLS protocol was underway.   Technical Description:  - CPR performance duration:  21 minutes  - Was defibrillation or cardioversion used? No   - Was external pacer placed? Yes  - Was patient intubated pre/post CPR? Yes   Medications Administered: Y = Yes; Blank = No Amiodarone    Atropine    Calcium    Epinephrine  Y  Lidocaine    Magnesium    Norepinephrine    Phenylephrine    Sodium bicarbonate    Vasopressin     Post CPR evaluation:  - Final Status - Was patient successfully resuscitated ? Yes - What is current rhythm? Sinus tach - What is current hemodynamic status? stable  Miscellaneous Information:  - Labs sent, including: CXR,   - Primary team notified?  Yes  - Family Notified? Yes  - Additional notes/ transfer status: ACLS protocol underway by the time code team arrived to patient's room. Patient underwent 3 rounds of epi before ROSC was obtained. Transferred to 3M06, PCCM Notified of patient's arrival.      Dolan Amen, MD  10/08/2020, 11:09 PM

## 2020-10-07 NOTE — Progress Notes (Signed)
eLink Physician-Brief Progress Note Patient Name: Tommy Perez DOB: May 18, 1966 MRN: [id number]   Date of Service  Nov 02, 2020  HPI/Events of Note  Patient is s/p cardiac arrest and 3 rounds of epinephrine + CPR prior to ROSC, he was transferred to 3 M 06 post code, he is following commands. Recent history significant for CVA, past history includes hypertension and diabetes mellitus. Bedside requesting sedation orders and restraint orders.  eICU Interventions  Orders entered. New Patient Evaluation completed. PCCM  Rounder will see him in formal consultation.        Migdalia Dk 11-02-2020, 10:52 PM

## 2020-10-07 NOTE — ED Notes (Signed)
Patient transported to CT 

## 2020-10-07 NOTE — Progress Notes (Signed)
Notified patient's emergency contacts of code blue and transfer to ICU. Spoke with [employer] (cousin) at [phone]. She will be coming to the hospital.   Left HIPAA-compliant voice mails for Zipporah Plants (cousin) at [phone] and Mellody Life (friend) at [phone].   Fayette Pho, MD

## 2020-10-07 NOTE — Progress Notes (Signed)
eLink Physician-Brief Progress Note Patient Name: Tommy Perez DOB: 10/06/1965 MRN: [id number]   Date of Service  10/06/2020  HPI/Events of Note  Urinary retention.  eICU Interventions  Foley catheter protocol ordered.        Migdalia Dk 10/13/2020, 11:33 PM

## 2020-10-07 NOTE — ED Notes (Signed)
Attempted report x1. 

## 2020-10-07 NOTE — Consult Note (Incomplete)
NAME:  Tommy Perez, MRN:  [id number], DOB:  November 07, 1965, LOS: 0 ADMISSION DATE:  09/27/2020, CONSULTATION DATE:  *** REFERRING MD:  ***, CHIEF COMPLAINT:  ***   Brief History:  55 year old male that was originally admitted for CVA had a brief cardiac arrest event today.  History of Present Illness:  This is a 55 year old black male that was originally admitted on 10/16/2020.  Patient was admitted with acute right ACA CVA.  The patient had garbled speech ataxic and falling at home.  During the admission the patient is being prepped for discharge to rehab.  He was noted to be bradycardic on telemetry tonight once nurse arrived in the room found to patient having agonal respirations and very bradycardic.  CODE BLUE was called and ACLS was initiated.  Patient required CPR and 3 round of epinephrine.  He was intubated by anesthesia.  CPR was performed for total of 21 minutes.  Patient is transferred to [facility] and PCCM was consulted at that time.  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  Consults:  Critical care medicine Neurology  Significant Diagnostic Tests:  Chest x-ray demonstrates progressive right lower lobe infiltrate consistent with aspiration pneumonitis  Micro Data:  Covid negative on 09/29/2020 Influenza negative  Antimicrobials:  None   Objective   Blood pressure (!) 118/91, pulse 60, temperature 98.6 F (37 C), temperature source Oral, resp. rate 17, weight 76.2 kg, SpO2 100 %.    Vent Mode: PRVC FiO2 (%):  [100 %] 100 % Set Rate:  [18 bmp] 18 bmp Vt Set:  [580 mL] 580 mL PEEP:  [5 cmH20] 5 cmH20 Plateau Pressure:  [23 cmH20] 23 cmH20   Intake/Output Summary (Last 24 hours) at 09/23/2020 2341 Last data filed at 09/27/2020 2030 Gross per 24 hour  Intake 240 ml  Output -  Net 240 ml   Filed Weights   10/04/2020 2245  Weight: 76.2 kg     Examination: General: No acute distress HENT: *Orally intubated with endotracheal tube.  Mucous membranes are moist. Lungs: Bilaterally clear in all lung fields no wheezing rhonchi rales noted Cardiovascular: Regular rate bradycardic Abdomen: Soft, right upper quadrant tenderness Extremities: *** Neuro: *** GU: ***  Resolved Hospital Problem list   ***  Assessment & Plan:  ***  Best practice (evaluated daily)  Diet: *** Pain/Anxiety/Delirium protocol (if indicated): *** VAP protocol (if indicated): *** DVT prophylaxis: *** GI prophylaxis: *** Glucose control: *** Mobility: *** Disposition:***  Goals of Care:  Last date of multidisciplinary goals of care discussion:*** Family and staff present: *** Summary of discussion: *** Follow up goals of care discussion due: *** Code Status: ***  Labs   CBC: Recent Labs  Lab 10/03/20 0526 10/05/20 0621 10/16/2020 1118 10/06/2020 1128  WBC 5.4 5.0 5.6  --   NEUTROABS  --   --  4.6  --   HGB 12.5* 12.8* 12.2* 13.6  HCT 39.9 41.4 39.8 40.0  MCV 85.4 85.2 88.8  --   PLT 190 217 179  --     Basic Metabolic Panel: Recent Labs  Lab 10/01/20 0507 10/02/20 0513 10/03/20 0526 10/04/20 0516 10/05/20 0621 10/06/20 0506 10/08/2020 1118 10/17/2020 1128  NA 147*   < > 144 142 138 137 138 140  K 4.6   < > 4.1 4.3 4.6 4.5 4.8 4.7  CL 111   < > 111 111 109 109  106 105  CO2 26   < > 23 22 23 22 24   --   GLUCOSE 78   < > 102* 60* 88 91 195* 190*  BUN 35*   < > 34* 33* 33* 34* 36* 38*  CREATININE 2.17*   < > 2.07* 1.98* 2.12* 2.19* 2.40* 2.40*  CALCIUM 8.0*   < > 7.8* 7.6* 7.8* 7.7* 7.9*  --   MG 2.2  --   --   --  1.9  --   --   --    < > = values in this interval not displayed.   GFR: Estimated Creatinine Clearance: 37 mL/min (A) (by C-G formula based on SCr of 2.4 mg/dL (H)). Recent Labs  Lab 10/03/20 0526 10/05/20 0621 October 17, 2020 1118  WBC 5.4 5.0 5.6    Liver Function Tests: Recent Labs  Lab 10/05/20 1734  10/17/2020 1118  AST  --  45*  ALT  --  33  ALKPHOS  --  109  BILITOT  --  0.6  PROT 5.9* 5.8*  ALBUMIN  --  2.0*   No results for input(s): LIPASE, AMYLASE in the last 168 hours. No results for input(s): AMMONIA in the last 168 hours.  ABG    Component Value Date/Time   TCO2 26 17-Oct-2020 1128     Coagulation Profile: Recent Labs  Lab 2020/10/17 1118  INR 1.0    Cardiac Enzymes: No results for input(s): CKTOTAL, CKMB, CKMBINDEX, TROPONINI in the last 168 hours.  HbA1C: Hgb A1c MFr Bld  Date/Time Value Ref Range Status  09/30/2020 04:35 AM 8.4 (H) 4.8 - 5.6 % Final    Comment:    (NOTE) Pre diabetes:          5.7%-6.4%  Diabetes:              >6.4%  Glycemic control for   <7.0% adults with diabetes     CBG: Recent Labs  Lab 17-Oct-2020 1103 17-Oct-2020 1840 2020/10/17 2059 October 17, 2020 2208 2020/10/17 2247  GLUCAP 168* 234* 212* 201* 203*    Review of Systems:   ***  Past Medical History:  He,  has a past medical history of Ambulatory dysfunction, Diabetes mellitus, type 2 (HCC), Dysphagia following cerebrovascular accident (CVA), Hypertension, and Nicotine dependence.   Surgical History:  History reviewed. No pertinent surgical history.   Social History:   reports that he has been smoking cigarettes. He has been smoking about 1.00 pack per day. He has never used smokeless tobacco. He reports previous alcohol use. He reports previous drug use.   Family History:  His family history is negative for Heart attack.   Allergies Allergies  Allergen Reactions  . Penicillins Other (See Comments)     Home Medications  Prior to Admission medications   Medication Sig Start Date End Date Taking? Authorizing Provider  amLODipine (NORVASC) 10 MG tablet Take 1 tablet (10 mg total) by mouth daily. 10/06/20  Yes 10/08/20, DO  aspirin EC 81 MG EC tablet Take 1 tablet (81 mg total) by mouth daily. Swallow whole. 10/06/20  Yes 10/08/20 A, DO  atorvastatin  (LIPITOR) 40 MG tablet Take 1 tablet (40 mg total) by mouth at bedtime. 10/06/20  Yes 10/08/20 A, DO  carvedilol (COREG) 12.5 MG tablet Take 1 tablet (12.5 mg total) by mouth 2 (two) times daily with a meal. 10/06/20  Yes 10/08/20 A, DO  clopidogrel (PLAVIX) 75 MG tablet Take 1 tablet (75 mg total) by mouth  daily. 10/06/20 10/06/21 Yes Esaw Grandchild A, DO  hydrALAZINE (APRESOLINE) 100 MG tablet Take 1 tablet (100 mg total) by mouth 3 (three) times daily. 10/06/20  Yes Esaw Grandchild A, DO  empagliflozin (JARDIANCE) 10 MG TABS tablet Take 10 mg by mouth daily. Patient not taking: Reported on 09/25/2020    [provider]  metFORMIN (GLUCOPHAGE) 1000 MG tablet Take 1,000 mg by mouth in the morning and at bedtime. Patient not taking: Reported on 10/03/2020    [provider]     Critical care time: 

## 2020-10-07 NOTE — Progress Notes (Addendum)
Call from TELE with HR in the 30s, RN is in room, noted pt with low resp of 9, agonal nreathing.  Code blue initiated. Code blue team and MD at bedside.  Patient transferred to 63M06. Report given 63M RN, all belongings sent. Attending MD and Family aware of disposition.

## 2020-10-07 NOTE — Progress Notes (Signed)
Inpatient Rehab Admissions Coordinator Note:   Per PT recommendations, pt was screened for CIR candidacy by Estill Dooms, PT, DPT.  Pt is known to this [facility] from admission at [facility].  Will place order for CIR consult to assess for candidacy.  Please contact me with questions.   Estill Dooms, PT, DPT [phone] 09/25/2020 8:45 PM

## 2020-10-07 NOTE — H&P (Signed)
History and Physical    Tommy Perez ZWC:[id number] DOB: 1965/08/28 DOA: 09/30/2020  PCP: Patient, No Pcp Per Consultants:  None Patient coming from:  Home - lives with wife and 2 other relatives; NOK: Tommy Perez, [phone]; cousins, Tommy Perez Tommy Perez?) [phone] or Tommy Perez (?) [phone]   Chief Complaint: weakness  HPI: Tommy Perez is a 55 y.o. male with medical history significant of DM; nicotine dependence; and HTN who was admitted to [facility] from 2/8-15 for acute right ACA CVA.  He was planned to d/c to CIR but apparently left AMA prior to placement.  He reports that he wanted to go home and signed himself out.  He got home and was too weak to be successful at home.  Family called EMS but when they came out the patient appeared to have the capacity to decline transport and so they left.  Family finally convinced him that with his generalized weakness, ataxia, and speech disturbance that he had to come back to the ER.  He already reports wanting to go home, but begrudgingly agrees that he will stay and be placed as needed in order to become as independent as possible again.    ED Course:  Stroke, admitted at [facility].  Signed out AMA.  Brought back by his step-son - garbled speech, ataxic at home, falling.  CIR cannot take directly from the ER.  Review of Systems: As per HPI; otherwise review of systems reviewed and negative.  Limited by dysarthria.   Past Medical History:  Diagnosis Date  . Ambulatory dysfunction   . Diabetes mellitus, type 2 (HCC)   . Dysphagia following cerebrovascular accident (CVA)   . Hypertension   . Nicotine dependence     History reviewed. No pertinent surgical history.  Social History   Socioeconomic History  . Marital status: Single    Spouse name: Not on file  . Number of children: Not on file  . Years of education: Not on file  . Highest education level: Not on file  Occupational History  . Occupation: cleaning services  Tobacco  Use  . Smoking status: Current Every Day Smoker    Packs/day: 1.00    Types: Cigarettes  . Smokeless tobacco: Never Used  Substance and Sexual Activity  . Alcohol use: Not Currently  . Drug use: Not Currently  . Sexual activity: Not on file  Other Topics Concern  . Not on file  Social History Narrative  . Not on file   Social Determinants of Health   Financial Resource Strain: Not on file  Food Insecurity: Not on file  Transportation Needs: Not on file  Physical Activity: Not on file  Stress: Not on file  Social Connections: Not on file  Intimate Partner Violence: Not on file    Allergies  Allergen Reactions  . Penicillins Other (See Comments)    Family History  Problem Relation Age of Onset  . Heart attack Neg Hx     Prior to Admission medications   Medication Sig Start Date End Date Taking? Authorizing Provider  amLODipine (NORVASC) 10 MG tablet Take 1 tablet (10 mg total) by mouth daily. 10/06/20   Pennie Banter, DO  aspirin EC 81 MG EC tablet Take 1 tablet (81 mg total) by mouth daily. Swallow whole. 10/06/20   Pennie Banter, DO  atorvastatin (LIPITOR) 40 MG tablet Take 1 tablet (40 mg total) by mouth at bedtime. 10/06/20   Esaw Grandchild A, DO  carvedilol (COREG) 12.5 MG tablet Take 1 tablet (  12.5 mg total) by mouth 2 (two) times daily with a meal. 10/06/20   Esaw Grandchild A, DO  clopidogrel (PLAVIX) 75 MG tablet Take 1 tablet (75 mg total) by mouth daily. 10/06/20 10/06/21  Esaw Grandchild A, DO  empagliflozin (JARDIANCE) 10 MG TABS tablet Take 10 mg by mouth daily.    [provider]  hydrALAZINE (APRESOLINE) 100 MG tablet Take 1 tablet (100 mg total) by mouth 3 (three) times daily. 10/06/20   Pennie Banter, DO  metFORMIN (GLUCOPHAGE) 1000 MG tablet Take 1,000 mg by mouth in the morning and at bedtime.    [provider]    Physical Exam: Vitals:   10-15-20 1330 2020-10-15 1345 10/15/20 1545 2020-10-15 1630  BP: 114/80 120/76 129/71 (!)  143/81  Pulse: (!) 45 (!) 45 (!) 44 (!) 45  Resp: 14 14 15  (!) 23  Temp:  98 F (36.7 C)    SpO2: 98% 97% 97% 99%     . General:  Appears slow to respond, significant dysarthria . Eyes:  R exotropia, normal lids, iris . ENT:  grossly normal hearing, lips & tongue, mmm . Neck:  no LAD, masses or thyromegaly . Cardiovascular:  RR with bradycardia, no m/r/g. No LE edema.  Respiratory:   CTA bilaterally with no wheezes/rales/rhonchi.  Normal respiratory effort. . Abdomen:  soft, NT, ND, NABS . Skin:  no rash or induration seen on limited exam . Musculoskeletal:  Decreased tone BUE/BLE without obvious localization, no bony abnormality . Psychiatric: blunted mood and affect, speech dysarthric . Neurologic:  CN 2-12 grossly intact, moves all extremities in coordinated fashion but with 4-5/5 strength diffusely, sensation intact    Radiological Exams on Admission: Independently reviewed - see discussion in A/P where applicable  CT Head Wo Contrast  Result Date: October 15, 2020 CLINICAL DATA:  Recent CVA EXAM: CT HEAD WITHOUT CONTRAST TECHNIQUE: Contiguous axial images were obtained from the base of the skull through the vertex without intravenous contrast. COMPARISON:  Head CT September 29, 2020 and brain MRI September 29, 2020 FINDINGS: Brain: There is stable mild diffuse atrophy for age. There is no demonstrable intracranial mass, hemorrhage, extra-axial fluid collection, or midline shift. There is focal decreased attenuation in the medial right anterior cingulate gyrus at the site of recent acute infarct, demonstrated on recent brain MRI. This area appears essentially stable in size and contour by noncontrast enhanced head CT. There is no associated hemorrhage. Elsewhere there is patchy small vessel disease in the centra semiovale bilaterally. There is evidence of a prior infarct in the right pons. Scattered small foci of decreased attenuation noted in each cerebellar hemisphere, head of caudate  nucleus on the right, and in the left thalamus consistent with prior infarcts. No new infarct compared to 1 week prior. Vascular: No hyperdense vessel. There is calcification in each carotid siphon region. Skull: The bony calvarium appears intact. Sinuses/Orbits: Mucosal thickening noted in several ethmoid air cells as well as mucosal thickening in the anterior left sphenoid sinus. Orbits appear symmetric bilaterally. Other: Mastoid air cells are clear. IMPRESSION: Mild atrophy for age. There is periventricular small vessel disease as well as prior infarcts at several sites, stable. No change in decreased attenuation in the medial right cingulate gyrus at the site of recent acute infarct documented by MR. No new infarct evident. No mass or hemorrhage. There are foci arterial vascular calcification. Areas of paranasal sinus disease noted. Electronically Signed   By: October 01, 2020 III M.D.   On: 15-Oct-2020  13:29    EKG: Independently reviewed.  NSR with rate 124; too much artifact, no obvious ischemia    Labs on Admission: I have personally reviewed the available labs and imaging studies at the time of the admission.  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)   Essential hypertension   AKI (acute kidney injury) (HCC)   Type 2 diabetes mellitus with stage 3a chronic kidney disease, without long-term current use of insulin (HCC)   Nicotine dependence, cigarettes, uncomplicated   Dysphagia following cerebrovascular accident (CVA)   Ambulatory and dysarthria resulting from recent CVA -Patient was admitted to [facility] for 1 week fr an acute R ACA CVA -He was in the process of insurance authorization for CIR when he signed out AMA -He is  already unsuccessful at home and now appears to recognize the need for therapy -Will admit, as he appears to require repeat evaluations (PT/OT/ST) and is very likely to need placement - CIR if he qualifies, SNF rehab if not -Stroke evaluation was completed during prior hospitalization and he does not appear to need ongoing neurology support at this time -Will continue ASA/Plavix -He is supposed to have Zio patch placement to look for PAF - ?as an outpatient -He was tolerating dysphagia 3 diet at [facility] but will defer to ST for now and keep NPO until after evaluation  HTN -Continue hydralazine, amlodipine, Coreg   HLD -Continue Lipitor 40 mg daily -Recent lipids: 173/66/87/99   DM -Recent A1c shows poor control (8.4) -He has not yet filled Rx for glucophage, Jardiance -Will order moderate-scale SSI  Stage 3b CKD -Appears slightly worse than baseline -Will give gentle IVF hydration and follow  Tobacco dependence  -Encourage cessation.   -This was discussed with the patient and should be reviewed on an ongoing basis.   -Patch ordered   Note: This patient has been tested and was negative for the novel coronavirus COVID-19 at the time of admission on 2/8.    DVT prophylaxis:  Lovenox  Code Status: Full - confirmed with patient/family Family Communication: Step-son present throughout evaluation Disposition Plan:  The patient is from: home  Anticipated d/c is to: CIR vs. SNF  Anticipated d/c date will depend on clinical response to treatment and upon arrangement of placement  Patient is currently: acutely ill Consults called: PT/OT/ST; TOC team Admission status: Admit - It is my clinical opinion that admission to INPATIENT is reasonable and necessary because of the expectation that this patient will require hospital care that crosses at least 2 midnights to treat this condition based on the medical complexity of the problems presented.  Given the aforementioned information, the  predictability of an adverse outcome is felt to be significant.    Jonah Blue MD [facility]   How to contact the [facility] Attending or Consulting provider 7A - 7P or covering provider during after hours 7P -7A, for this patient?  1. Check the care team in [facility] and look for a) attending/consulting [facility] provider listed and b) the [facility] team listed 2. Log into [email or web address] and use [place]'s universal password to access. If you do not have the password, please contact the hospital operator. 3. Locate the [facility] provider you are looking for under [facility] and page to  a number that you can be directly reached. 4. If you still have difficulty reaching the provider, please page the [facility] (Director [facility] Call) for the Hospitalists listed on amion for assistance.   04/02/21, 6:33 PM

## 2020-10-07 NOTE — ED Notes (Signed)
The pts son wants to know when he will be receiving his meds. He also wants to  Talk to the doctors to see the plan of care and what his c-t showed

## 2020-10-07 NOTE — ED Provider Notes (Signed)
[place] EMERGENCY DEPARTMENT Provider Note  CSN: [id number] Arrival date & time: 10/12/2020 1100    History Chief Complaint  Patient presents with  . Stroke Symptoms    0    HPI  Tommy Perez is a 55 y.o. male brought to the ED by step-son. Patient admitted to [facility] for stroke on 2/8 with ataxia, frequent falls, dysarthria and difficulty swallowing. He was there for about a week with planned discharge to [facility] but patient decided to leave AMA and go home. Step-son at bedside states he had improved while in the hospital but when he arrived home yesterday morning had 'regressed' to worsening dysarthria and LE weakness/ataxia. Step-son convinced the patient to come back the hospital today.    Past Medical History:  Diagnosis Date  . Diabetes mellitus, type 2 (HCC)   . Hypertension     No past surgical history on file.  Family History  Problem Relation Age of Onset  . Heart attack Neg Hx     Social History   Tobacco Use  . Smoking status: Current Every Day Smoker    Packs/day: 1.00    Types: Cigarettes  . Smokeless tobacco: Never Used  Substance Use Topics  . Alcohol use: Not Currently  . Drug use: Not Currently     Home Medications Prior to Admission medications   Medication Sig Start Date End Date Taking? Authorizing Provider  amLODipine (NORVASC) 10 MG tablet Take 1 tablet (10 mg total) by mouth daily. 10/06/20   Pennie Banter, DO  aspirin EC 81 MG EC tablet Take 1 tablet (81 mg total) by mouth daily. Swallow whole. 10/06/20   Pennie Banter, DO  atorvastatin (LIPITOR) 40 MG tablet Take 1 tablet (40 mg total) by mouth at bedtime. 10/06/20   Pennie Banter, DO  carvedilol (COREG) 12.5 MG tablet Take 1 tablet (12.5 mg total) by mouth 2 (two) times daily with a meal. 10/06/20   Esaw Grandchild A, DO  clopidogrel (PLAVIX) 75 MG tablet Take 1 tablet (75 mg total) by mouth daily. 10/06/20 10/06/21  Esaw Grandchild A, DO  empagliflozin (JARDIANCE)  10 MG TABS tablet Take 10 mg by mouth daily.    [provider]  hydrALAZINE (APRESOLINE) 100 MG tablet Take 1 tablet (100 mg total) by mouth 3 (three) times daily. 10/06/20   Pennie Banter, DO  metFORMIN (GLUCOPHAGE) 1000 MG tablet Take 1,000 mg by mouth in the morning and at bedtime.    [provider]     Allergies    Penicillins   Review of Systems   Review of Systems Unable to assess due to dysarthria  Physical Exam BP 120/76   Pulse (!) 45   Temp 98 F (36.7 C)   Resp 14   SpO2 97%   Physical Exam Vitals and nursing note reviewed.  Constitutional:      Appearance: Normal appearance.  HENT:     Head: Normocephalic and atraumatic.     Nose: Nose normal.     Mouth/Throat:     Mouth: Mucous membranes are moist.  Eyes:     Extraocular Movements: Extraocular movements intact.     Conjunctiva/sclera: Conjunctivae normal.  Cardiovascular:     Rate and Rhythm: Normal rate.  Pulmonary:     Effort: Pulmonary effort is normal.     Breath sounds: Normal breath sounds.  Abdominal:     General: Abdomen is flat.     Palpations: Abdomen is soft.  Tenderness: There is no abdominal tenderness.  Musculoskeletal:        General: No swelling. Normal range of motion.     Cervical back: Neck supple.  Skin:    General: Skin is warm and dry.  Neurological:     Mental Status: He is alert.     Sensory: No sensory deficit.     Motor: No weakness.     Comments: Dysarthria  Psychiatric:        Mood and Affect: Mood normal.      ED Results / Procedures / Treatments   Labs (all labs ordered are listed, but only abnormal results are displayed) Labs Reviewed  CBC - Abnormal; Notable for the following components:      Result Value   Hemoglobin 12.2 (*)    RDW 16.8 (*)    All other components within normal limits  COMPREHENSIVE METABOLIC PANEL - Abnormal; Notable for the following components:   Glucose, Bld 195 (*)    BUN 36 (*)    Creatinine, Ser 2.40  (*)    Calcium 7.9 (*)    Total Protein 5.8 (*)    Albumin 2.0 (*)    AST 45 (*)    GFR, Estimated 31 (*)    All other components within normal limits  CBG MONITORING, ED - Abnormal; Notable for the following components:   Glucose-Capillary 168 (*)    All other components within normal limits  I-STAT CHEM 8, ED - Abnormal; Notable for the following components:   BUN 38 (*)    Creatinine, Ser 2.40 (*)    Glucose, Bld 190 (*)    Calcium, Ion 1.07 (*)    All other components within normal limits  PROTIME-INR  APTT  DIFFERENTIAL  CBG MONITORING, ED    EKG EKG Interpretation  Date/Time:  Wednesday October 07 2020 11:38:50 EST Ventricular Rate:  124 PR Interval:    QRS Duration: 104 QT Interval:  378 QTC Calculation: 471 R Axis:   75 Text Interpretation: Interpretation limited secondary to artifact narrow complex regular rhythm No significant change since last tracing Confirmed by Susy Frizzle [phone]) on 10/09/2020 11:58:37 AM   Radiology CT Head Wo Contrast  Result Date: 10/17/2020 CLINICAL DATA:  Recent CVA EXAM: CT HEAD WITHOUT CONTRAST TECHNIQUE: Contiguous axial images were obtained from the base of the skull through the vertex without intravenous contrast. COMPARISON:  Head CT September 29, 2020 and brain MRI September 29, 2020 FINDINGS: Brain: There is stable mild diffuse atrophy for age. There is no demonstrable intracranial mass, hemorrhage, extra-axial fluid collection, or midline shift. There is focal decreased attenuation in the medial right anterior cingulate gyrus at the site of recent acute infarct, demonstrated on recent brain MRI. This area appears essentially stable in size and contour by noncontrast enhanced head CT. There is no associated hemorrhage. Elsewhere there is patchy small vessel disease in the centra semiovale bilaterally. There is evidence of a prior infarct in the right pons. Scattered small foci of decreased attenuation noted in each cerebellar  hemisphere, head of caudate nucleus on the right, and in the left thalamus consistent with prior infarcts. No new infarct compared to 1 week prior. Vascular: No hyperdense vessel. There is calcification in each carotid siphon region. Skull: The bony calvarium appears intact. Sinuses/Orbits: Mucosal thickening noted in several ethmoid air cells as well as mucosal thickening in the anterior left sphenoid sinus. Orbits appear symmetric bilaterally. Other: Mastoid air cells are clear. IMPRESSION: Mild atrophy for age. There  is periventricular small vessel disease as well as prior infarcts at several sites, stable. No change in decreased attenuation in the medial right cingulate gyrus at the site of recent acute infarct documented by MR. No new infarct evident. No mass or hemorrhage. There are foci arterial vascular calcification. Areas of paranasal sinus disease noted. Electronically Signed   By: Bretta Bang III M.D.   On: 09/25/2020 13:29    Procedures Procedures  Medications Ordered in the ED Medications  sodium chloride flush (NS) 0.9 % injection 3 mL (0 mLs Intravenous Hold 09/24/2020 1129)     MDM Rules/Calculators/A&P MDM Patient with known stroke, initially planned for CIR today but left from [facility] yesterday. Notes from yesterday not yet available. Will recheck labs, repeat CT to rule out secondary bleed and discuss with hospitalist.  ED Course  I have reviewed the triage vital signs and the nursing notes.  Pertinent labs & imaging results that were available during my care of the patient were reviewed by me and considered in my medical decision making (see chart for details).  Clinical Course as of 10/15/2020 1534  Wed Oct 07, 2020  1201 CBC unremarkable.  [CS]  1247 CBC with creatinine mildly increased from previous.  [CS]  1444 Spoke with Dr. Ophelia Charter, Hospitalist, who will evaluate for re-admission.  [CS]    Clinical Course User Index [CS] Pollyann Savoy, MD    Final Clinical  Impression(s) / ED Diagnoses Final diagnoses:  Late effect of stroke    Rx / DC Orders ED Discharge Orders    None       Pollyann Savoy, MD 10/13/2020 1534

## 2020-10-07 NOTE — Consult Note (Signed)
NAME:  Tommy Perez, MRN:  [id number], DOB:  1966-08-04, LOS: 0 ADMISSION DATE:  10-19-2020, CONSULTATION DATE: 10/19/20 REFERRING MD: Dr. Ophelia Charter CHIEF COMPLAINT: Status post cardiac arrest Brief History:  55 year old male that was originally admitted for CVA had a brief cardiac arrest event today.  History of Present Illness:  This is a 55 year old black male that was originally admitted on 2020/10/19.  Patient was admitted with acute right ACA CVA.  The patient had garbled speech ataxic and falling at home.  During the admission the patient is being prepped for discharge to rehab.  He was noted to be bradycardic on telemetry tonight once nurse arrived in the room found to patient having agonal respirations and very bradycardic.  CODE BLUE was called and ACLS was initiated.  Patient required CPR and 3 round of epinephrine.  He was intubated by anesthesia.  CPR was performed for total of 21 minutes.  Patient is transferred to [facility] and PCCM was consulted at that time.  During exam post cardiac arrest, the patient exhibited right upper quadrant tenderness.  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  Consults:  Critical care medicine Neurology  Significant Diagnostic Tests:  Chest x-ray demonstrates progressive right lower lobe infiltrate consistent with aspiration pneumonitis  Micro Data:  Covid negative on 09/29/2020 Influenza negative  Antimicrobials:  None   Objective   Blood pressure (!) 118/91, pulse 60, temperature 98.6 F (37 C), temperature source Oral, resp. rate 17, weight 76.2 kg, SpO2 100 %.    Vent Mode: PRVC FiO2 (%):  [100 %] 100 % Set Rate:  [18 bmp] 18 bmp Vt Set:  [580 mL] 580 mL PEEP:  [5 cmH20] 5 cmH20 Plateau Pressure:  [23 cmH20] 23 cmH20   Intake/Output Summary (Last 24 hours) at 10-19-2020 2341 Last data filed at 2020/10/19  2030 Gross per 24 hour  Intake 240 ml  Output --  Net 240 ml   Filed Weights   19-Oct-2020 2245  Weight: 76.2 kg    Examination: General: No acute distress HENT: *Orally intubated with endotracheal tube.  Mucous membranes are moist. Lungs: Bilaterally clear in all lung fields no wheezing rhonchi rales noted Cardiovascular: Regular rate bradycardic Abdomen: Soft, right upper quadrant tenderness Extremities: Distal pulse intact x4.  Chronic venous stasis of lower extremities.  Trace edema. Neuro: RASS -1 awakens to follow commands x4 extremities.  Nods appropriately to yes/no questions.   Assessment & Plan:  Status post cardiac arrest Acute respiratory failure Recent right ACA CVA Hypertension Bradycardia Right upper quadrant tenderness  Plan: Most likely the patient had a aspiration event leading to right lower lobe aspiration pneumonitis which ultimately compromised his airway and cause bradycardia and PEA cardiac arrest.  Neurologically the patient has reawoken and following commands therefore is not in the patient for hypothermia protocol. Continue current ventilator support. EKG and troponins are pending. Abdominal ultrasound Amylase, lipase, and LFTs ordered Strictly n.p.o. Currently patient does not require vasoactive support we will continue to monitor hemodynamics. Place Foley catheter. Follow-up ABG is pending. Continue to closely monitor neurologic status.  Would repeat head CT if neurologic status changes  Best practice (evaluated daily)  Diet: N.p.o. Pain/Anxiety/Delirium protocol (if indicated): Propofol  VAP protocol (if indicated): Initiated DVT prophylaxis: Lovenox GI prophylaxis: Protonix Glucose control: Insulin sliding scale Mobility: Bedrest Disposition: Transferred to ICU  Goals of Care:   Code Status: Full code  Labs   CBC: Recent Labs  Lab 10/03/20 0526 10/05/20 0621 10/02/2020 1118 09/22/2020 1128  WBC 5.4 5.0 5.6  --   NEUTROABS  --    --  4.6  --   HGB 12.5* 12.8* 12.2* 13.6  HCT 39.9 41.4 39.8 40.0  MCV 85.4 85.2 88.8  --   PLT 190 217 179  --     Basic Metabolic Panel: Recent Labs  Lab 10/01/20 0507 10/02/20 0513 10/03/20 0526 10/04/20 0516 10/05/20 0621 10/06/20 0506 10/06/2020 1118 10/09/2020 1128  NA 147*   < > 144 142 138 137 138 140  K 4.6   < > 4.1 4.3 4.6 4.5 4.8 4.7  CL 111   < > 111 111 109 109 106 105  CO2 26   < > 23 22 23 22 24   --   GLUCOSE 78   < > 102* 60* 88 91 195* 190*  BUN 35*   < > 34* 33* 33* 34* 36* 38*  CREATININE 2.17*   < > 2.07* 1.98* 2.12* 2.19* 2.40* 2.40*  CALCIUM 8.0*   < > 7.8* 7.6* 7.8* 7.7* 7.9*  --   MG 2.2  --   --   --  1.9  --   --   --    < > = values in this interval not displayed.   GFR: Estimated Creatinine Clearance: 37 mL/min (A) (by C-G formula based on SCr of 2.4 mg/dL (H)). Recent Labs  Lab 10/03/20 0526 10/05/20 0621 09/29/2020 1118  WBC 5.4 5.0 5.6    Liver Function Tests: Recent Labs  Lab 10/05/20 1734 09/29/2020 1118  AST  --  45*  ALT  --  33  ALKPHOS  --  109  BILITOT  --  0.6  PROT 5.9* 5.8*  ALBUMIN  --  2.0*   No results for input(s): LIPASE, AMYLASE in the last 168 hours. No results for input(s): AMMONIA in the last 168 hours.  ABG    Component Value Date/Time   TCO2 26 09/25/2020 1128     Coagulation Profile: Recent Labs  Lab 09/29/2020 1118  INR 1.0    Cardiac Enzymes: No results for input(s): CKTOTAL, CKMB, CKMBINDEX, TROPONINI in the last 168 hours.  HbA1C: Hgb A1c MFr Bld  Date/Time Value Ref Range Status  09/30/2020 04:35 AM 8.4 (H) 4.8 - 5.6 % Final    Comment:    (NOTE) Pre diabetes:          5.7%-6.4%  Diabetes:              >6.4%  Glycemic control for   <7.0% adults with diabetes     CBG: Recent Labs  Lab 09/22/2020 1103 10/09/2020 1840 10/06/2020 2059 10/14/2020 2208 10/01/2020 2247  GLUCAP 168* 234* 212* 201* 203*    Review of Systems:   Unable to obtain secondary to patient's neurologic  condition.  Past Medical History:  He,  has a past medical history of Ambulatory dysfunction, Diabetes mellitus, type 2 (HCC), Dysphagia following cerebrovascular accident (CVA), Hypertension, and Nicotine dependence.   Surgical History:  History reviewed. No pertinent surgical history.   Social History:   reports that he has been smoking cigarettes. He has been smoking about 1.00 pack per day. He has never used smokeless tobacco. He reports previous alcohol use. He reports previous drug use.   Family History:  His family history is negative for Heart attack.   Allergies Allergies  Allergen Reactions  . Penicillins Other (See Comments)  Home Medications  Prior to Admission medications   Medication Sig Start Date End Date Taking? Authorizing Provider  amLODipine (NORVASC) 10 MG tablet Take 1 tablet (10 mg total) by mouth daily. 10/06/20  Yes Pennie Banter, DO  aspirin EC 81 MG EC tablet Take 1 tablet (81 mg total) by mouth daily. Swallow whole. 10/06/20  Yes Esaw Grandchild A, DO  atorvastatin (LIPITOR) 40 MG tablet Take 1 tablet (40 mg total) by mouth at bedtime. 10/06/20  Yes Esaw Grandchild A, DO  carvedilol (COREG) 12.5 MG tablet Take 1 tablet (12.5 mg total) by mouth 2 (two) times daily with a meal. 10/06/20  Yes Esaw Grandchild A, DO  clopidogrel (PLAVIX) 75 MG tablet Take 1 tablet (75 mg total) by mouth daily. 10/06/20 10/06/21 Yes Esaw Grandchild A, DO  hydrALAZINE (APRESOLINE) 100 MG tablet Take 1 tablet (100 mg total) by mouth 3 (three) times daily. 10/06/20  Yes Esaw Grandchild A, DO  empagliflozin (JARDIANCE) 10 MG TABS tablet Take 10 mg by mouth daily. Patient not taking: Reported on 09/25/2020    [provider]  metFORMIN (GLUCOPHAGE) 1000 MG tablet Take 1,000 mg by mouth in the morning and at bedtime. Patient not taking: Reported on 10/16/2020    [provider]     Critical care time: 

## 2020-10-07 NOTE — ED Triage Notes (Signed)
Pt arrives to ED with a family friend who states he was admitted to [facility] yesterday for possible stroke and was to come to [facility] but pt refused per this friend. Patient has garbled speech which friend states was going on while at [facility] but had improved the garbled speech has been since last night. He has not been able to ambulate since leaving the hospital.

## 2020-10-07 NOTE — Progress Notes (Signed)
I personally called and spoke with ms. Tommy Perez re: patient's condition s/p code blue. She reports that she is already on the way to the hospital to see patient. She reports that she does not have any questions at this time, and she will be here soon.

## 2020-10-07 NOTE — Anesthesia Procedure Notes (Signed)
Procedure Name: Intubation Date/Time: 09/29/2020 10:22 PM Performed by: Lewie Loron, MD Pre-anesthesia Checklist: Patient identified, Emergency Drugs available, Suction available and Patient being monitored Patient Re-evaluated:Patient Re-evaluated prior to induction Oxygen Delivery Method: Circle System Utilized Preoxygenation: Pre-oxygenation with 100% oxygen Laryngoscope Size: Glidescope and 3 Grade View: Grade I Tube type: Oral Tube size: 7.5 mm Number of attempts: 1 Airway Equipment and Method: Stylet and Video-laryngoscopy Placement Confirmation: ETT inserted through vocal cords under direct vision,  positive ETCO2 and breath sounds checked- equal and bilateral Secured at: 22 cm Tube secured with: Tape Dental Injury: Teeth and Oropharynx as per pre-operative assessment  Comments: Code intubation with Glide.

## 2020-10-07 NOTE — ED Notes (Signed)
Attempted to get oral and axillary temp on pt and was unsuccessful on both.

## 2020-10-07 NOTE — ED Notes (Signed)
Tried to take temp x4 and no results

## 2020-10-07 NOTE — Evaluation (Signed)
Physical Therapy Evaluation Patient Details Name: Tommy Perez MRN: [id number] DOB: August 24, 1965 Today's Date: 10/18/2020   History of Present Illness  Pt is a 55 y.o. M with significant PMH of DM2, CHF. Recently left AMA from [facility] on 2/15 after  MRI significant for acute/subacute R cingulate gyrus infarct, remote bilat cerebellar infarcts, R pons, L thalamus and bilat centrum semiovale infarcts. Pt returned to ED due to worsening dysarthric speech and balance.  Clinical Impression  Pt presents from home; one day leaving from [facility] AMA. Pt stepson reports pt with worsening dysarthric speech and balance, requiring being "carried from room to room." Pt presents with cognitive deficits, poor balance, decreased coordination, ataxia, and weakness. Requiring maximal assist for bed mobility (from stretcher) and moderate assist to stand. Unable to weight shift to take steps. Pt reporting he is able to walk and go home and return to work on Monday. Pt demonstrates extremely poor awareness of safety and deficits despite max education from PT and pt stepson. Pt presents as a high fall risk based on deficits listed above. Recommending post acute rehab to maximize functional mobility and decreased caregiver burden.     Follow Up Recommendations CIR;Supervision/Assistance - 24 hour    Equipment Recommendations  Rolling walker with 5" wheels;3in1 (PT);Wheelchair (measurements PT);Wheelchair cushion (measurements PT)    Recommendations for Other Services       Precautions / Restrictions Precautions Precautions: Fall Restrictions Weight Bearing Restrictions: No      Mobility  Bed Mobility Overal bed mobility: Needs Assistance Bed Mobility: Supine to Sit;Sit to Supine     Supine to sit: Max assist Sit to supine: Max assist   General bed mobility comments: MaxA for trunk and BLE assist with supine <> sit on stretcher    Transfers Overall transfer level: Needs assistance Equipment used:  None Transfers: Sit to/from Stand Sit to Stand: Mod assist         General transfer comment: ModA to stand from stretcher, pt posteriorly bracing knees on bed. Unable to weight shift.  Ambulation/Gait                Stairs            Wheelchair Mobility    Modified Rankin (Stroke Patients Only)       Balance Overall balance assessment: Needs assistance Sitting-balance support: Feet unsupported Sitting balance-Leahy Scale: Poor Sitting balance - Comments: Up to minA for sitting balance   Standing balance support: During functional activity;Single extremity supported Standing balance-Leahy Scale: Poor Standing balance comment: reliant on external support                             Pertinent Vitals/Pain Pain Assessment: No/denies pain    Home Living Family/patient expects to be discharged to:: Private residence Living Arrangements: Non-relatives/Friends Available Help at Discharge: Friend(s);Available 24 hours/day Type of Home: House Home Access: Level entry     Home Layout: One level Home Equipment: None      Prior Function Level of Independence: Independent         Comments: Typically independent prior to stroke. In the one day he was home from the hospital, pt stepson reporting they had to "carry him from room to room."     Hand Dominance   Dominant Hand: Right    Extremity/Trunk Assessment   Upper Extremity Assessment Upper Extremity Assessment: Defer to OT evaluation    Lower Extremity Assessment Lower Extremity Assessment: RLE deficits/detail;LLE  deficits/detail RLE Coordination: decreased gross motor LLE Coordination: decreased gross motor       Communication   Communication: Expressive difficulties  Cognition Arousal/Alertness: Awake/alert Behavior During Therapy: WFL for tasks assessed/performed Overall Cognitive Status: Impaired/Different from baseline Area of Impairment: Following  commands;Safety/judgement;Awareness;Problem solving                       Following Commands: Follows one step commands inconsistently Safety/Judgement: Decreased awareness of safety;Decreased awareness of deficits Awareness: Intellectual Problem Solving: Difficulty sequencing;Requires verbal cues General Comments: A&Ox3. Poor insight into safety/deficits, stating he wanted to go home and go back to work on Monday despite not being able to stand without assist      General Comments      Exercises     Assessment/Plan    PT Assessment Patient needs continued PT services  PT Problem List Decreased strength;Decreased activity tolerance;Decreased balance;Decreased mobility;Decreased coordination;Decreased cognition;Decreased knowledge of use of DME;Decreased safety awareness;Decreased knowledge of precautions;Cardiopulmonary status limiting activity;Impaired sensation;Decreased skin integrity       PT Treatment Interventions DME instruction;Gait training;Stair training;Functional mobility training;Therapeutic activities;Therapeutic exercise;Balance training;Neuromuscular re-education;Cognitive remediation;Patient/family education    PT Goals (Current goals can be found in the Care Plan section)  Acute Rehab PT Goals Patient Stated Goal: go home PT Goal Formulation: With patient Time For Goal Achievement: 10/21/20 Potential to Achieve Goals: Fair    Frequency Min 4X/week   Barriers to discharge        Co-evaluation               AM-PAC PT "6 Clicks" Mobility  Outcome Measure Help needed turning from your back to your side while in a flat bed without using bedrails?: A Little Help needed moving from lying on your back to sitting on the side of a flat bed without using bedrails?: Total Help needed moving to and from a bed to a chair (including a wheelchair)?: A Lot Help needed standing up from a chair using your arms (e.g., wheelchair or bedside chair)?: A  Lot Help needed to walk in hospital room?: Total Help needed climbing 3-5 steps with a railing? : Total 6 Click Score: 10    End of Session Equipment Utilized During Treatment: Gait belt Activity Tolerance: Patient limited by fatigue Patient left: in bed;with call bell/phone within reach;with family/visitor present Nurse Communication: Mobility status PT Visit Diagnosis: Muscle weakness (generalized) (M62.81);Difficulty in walking, not elsewhere classified (R26.2);Hemiplegia and hemiparesis;Other symptoms and signs involving the nervous system (R29.898)    Time: 0383-3383 PT Time Calculation (min) (ACUTE ONLY): 24 min   Charges:   PT Evaluation $PT Eval Moderate Complexity: 1 Mod PT Treatments $Therapeutic Activity: 8-22 mins        Lillia Pauls, PT, DPT Acute Rehabilitation Services Pager [phone] Office [phone]   Norval Morton 10/18/2020, 5:09 PM

## 2020-10-07 NOTE — Progress Notes (Addendum)
Code blue notification to pager at [phone]. Primary team notified over phone by Dr. Larita Fife, family med PGY-1 on code blue pager tonight.   Fayette Pho, MD

## 2020-10-08 ENCOUNTER — Inpatient Hospital Stay (HOSPITAL_COMMUNITY): Payer: 59

## 2020-10-08 DIAGNOSIS — J690 Pneumonitis due to inhalation of food and vomit: Secondary | ICD-10-CM

## 2020-10-08 DIAGNOSIS — J9601 Acute respiratory failure with hypoxia: Secondary | ICD-10-CM

## 2020-10-08 LAB — BASIC METABOLIC PANEL
Anion gap: 8 (ref 5–15)
BUN: 39 mg/dL — ABNORMAL HIGH (ref 6–20)
CO2: 23 mmol/L (ref 22–32)
Calcium: 7.3 mg/dL — ABNORMAL LOW (ref 8.9–10.3)
Chloride: 108 mmol/L (ref 98–111)
Creatinine, Ser: 2.53 mg/dL — ABNORMAL HIGH (ref 0.61–1.24)
GFR, Estimated: 29 mL/min — ABNORMAL LOW (ref >60)
Glucose, Bld: 195 mg/dL — ABNORMAL HIGH (ref 70–99)
Potassium: 3.9 mmol/L (ref 3.5–5.1)
Sodium: 139 mmol/L (ref 135–145)

## 2020-10-08 LAB — ALDOSTERONE + RENIN ACTIVITY W/ RATIO
ALDO / PRA Ratio: <1.7 (ref 0.0–30.0)
Aldosterone: <1 ng/dL (ref 0.0–30.0)
PRA LC/MS/MS: 0.6 ng/mL/hr (ref 0.167–5.380)

## 2020-10-08 LAB — MAGNESIUM: Magnesium: 2 mg/dL (ref 1.7–2.4)

## 2020-10-08 LAB — POCT I-STAT 7, (LYTES, BLD GAS, ICA,H+H)
Acid-base deficit: 4 mmol/L — ABNORMAL HIGH (ref 0.0–2.0)
Bicarbonate: 24 mmol/L (ref 20.0–28.0)
Calcium, Ion: 1.12 mmol/L — ABNORMAL LOW (ref 1.15–1.40)
HCT: 36 % — ABNORMAL LOW (ref 39.0–52.0)
Hemoglobin: 12.2 g/dL — ABNORMAL LOW (ref 13.0–17.0)
O2 Saturation: 100 %
Patient temperature: 30.4
Potassium: 3.7 mmol/L (ref 3.5–5.1)
Sodium: 141 mmol/L (ref 135–145)
TCO2: 26 mmol/L (ref 22–32)
pCO2 arterial: 42.4 mmHg (ref 32.0–48.0)
pH, Arterial: 7.326 — ABNORMAL LOW (ref 7.350–7.450)
pO2, Arterial: 295 mmHg — ABNORMAL HIGH (ref 83.0–108.0)

## 2020-10-08 LAB — PH, BODY FLUID: pH, Body Fluid: 7.5

## 2020-10-08 LAB — HEPATIC FUNCTION PANEL
ALT: 109 U/L — ABNORMAL HIGH (ref 0–44)
AST: 164 U/L — ABNORMAL HIGH (ref 15–41)
Albumin: 1.5 g/dL — ABNORMAL LOW (ref 3.5–5.0)
Alkaline Phosphatase: 139 U/L — ABNORMAL HIGH (ref 38–126)
Bilirubin, Direct: 0.1 mg/dL (ref 0.0–0.2)
Indirect Bilirubin: 0.4 mg/dL (ref 0.3–0.9)
Total Bilirubin: 0.5 mg/dL (ref 0.3–1.2)
Total Protein: 4.8 g/dL — ABNORMAL LOW (ref 6.5–8.1)

## 2020-10-08 LAB — GLUCOSE, CAPILLARY
Glucose-Capillary: 109 mg/dL — ABNORMAL HIGH (ref 70–99)
Glucose-Capillary: 114 mg/dL — ABNORMAL HIGH (ref 70–99)
Glucose-Capillary: 122 mg/dL — ABNORMAL HIGH (ref 70–99)
Glucose-Capillary: 140 mg/dL — ABNORMAL HIGH (ref 70–99)
Glucose-Capillary: 85 mg/dL (ref 70–99)
Glucose-Capillary: 88 mg/dL (ref 70–99)

## 2020-10-08 LAB — TROPONIN I (HIGH SENSITIVITY)
Troponin I (High Sensitivity): 159 ng/L (ref <18)
Troponin I (High Sensitivity): 319 ng/L (ref <18)

## 2020-10-08 LAB — AMYLASE: Amylase: 130 U/L — ABNORMAL HIGH (ref 28–100)

## 2020-10-08 LAB — LIPASE, BLOOD: Lipase: 55 U/L — ABNORMAL HIGH (ref 11–51)

## 2020-10-08 LAB — BODY FLUID CULTURE W GRAM STAIN: Culture: NO GROWTH

## 2020-10-08 LAB — MRSA PCR SCREENING: MRSA by PCR: NEGATIVE

## 2020-10-08 LAB — TRIGLYCERIDES: Triglycerides: 57 mg/dL (ref <150)

## 2020-10-08 MED ORDER — AMLODIPINE BESYLATE 10 MG PO TABS
10.0000 mg | ORAL_TABLET | Freq: Every day | ORAL | Status: DC
  Administered 2020-10-09 – 2020-10-15 (×7): 10 mg
  Filled 2020-10-08 (×8): qty 1

## 2020-10-08 MED ORDER — HYDRALAZINE HCL 50 MG PO TABS
100.0000 mg | ORAL_TABLET | Freq: Three times a day (TID) | ORAL | Status: DC
  Administered 2020-10-08 – 2020-10-15 (×21): 100 mg
  Filled 2020-10-08 (×23): qty 2

## 2020-10-08 MED ORDER — LEVOFLOXACIN IN D5W 750 MG/150ML IV SOLN
750.0000 mg | INTRAVENOUS | Status: DC
  Administered 2020-10-08 – 2020-10-10 (×2): 750 mg via INTRAVENOUS
  Filled 2020-10-08 (×2): qty 150

## 2020-10-08 MED ORDER — LACTATED RINGERS IV BOLUS
250.0000 mL | Freq: Once | INTRAVENOUS | Status: AC
  Administered 2020-10-08: 250 mL via INTRAVENOUS

## 2020-10-08 MED ORDER — PANTOPRAZOLE SODIUM 40 MG PO PACK
40.0000 mg | PACK | Freq: Every day | ORAL | Status: DC
  Administered 2020-10-08 – 2020-10-15 (×8): 40 mg
  Filled 2020-10-08 (×8): qty 20

## 2020-10-08 MED ORDER — ASPIRIN 81 MG PO CHEW
81.0000 mg | CHEWABLE_TABLET | Freq: Every day | ORAL | Status: DC
  Administered 2020-10-08 – 2020-10-15 (×8): 81 mg
  Filled 2020-10-08 (×8): qty 1

## 2020-10-08 MED ORDER — INSULIN ASPART 100 UNIT/ML ~~LOC~~ SOLN
0.0000 [IU] | SUBCUTANEOUS | Status: DC
  Administered 2020-10-08 – 2020-10-10 (×8): 1 [IU] via SUBCUTANEOUS
  Administered 2020-10-11 (×2): 2 [IU] via SUBCUTANEOUS
  Administered 2020-10-11 – 2020-10-13 (×12): 1 [IU] via SUBCUTANEOUS
  Administered 2020-10-13: 2 [IU] via SUBCUTANEOUS
  Administered 2020-10-13 (×2): 1 [IU] via SUBCUTANEOUS
  Administered 2020-10-14: 2 [IU] via SUBCUTANEOUS
  Administered 2020-10-14 (×3): 1 [IU] via SUBCUTANEOUS
  Administered 2020-10-14: 2 [IU] via SUBCUTANEOUS
  Administered 2020-10-15: 1 [IU] via SUBCUTANEOUS
  Administered 2020-10-15 (×3): 2 [IU] via SUBCUTANEOUS
  Administered 2020-10-17: 3 [IU] via SUBCUTANEOUS
  Administered 2020-10-17 – 2020-10-18 (×3): 1 [IU] via SUBCUTANEOUS
  Administered 2020-10-18: 2 [IU] via SUBCUTANEOUS

## 2020-10-08 MED ORDER — CLOPIDOGREL BISULFATE 75 MG PO TABS
75.0000 mg | ORAL_TABLET | Freq: Every day | ORAL | Status: DC
  Administered 2020-10-08 – 2020-10-15 (×8): 75 mg
  Filled 2020-10-08 (×8): qty 1

## 2020-10-08 MED ORDER — VITAL HIGH PROTEIN PO LIQD
1000.0000 mL | ORAL | Status: DC
  Administered 2020-10-08: 1000 mL

## 2020-10-08 MED ORDER — PROSOURCE TF PO LIQD
45.0000 mL | Freq: Two times a day (BID) | ORAL | Status: DC
  Administered 2020-10-08: 45 mL
  Filled 2020-10-08: qty 45

## 2020-10-08 MED ORDER — VITAL AF 1.2 CAL PO LIQD
1000.0000 mL | ORAL | Status: DC
  Administered 2020-10-08 – 2020-10-15 (×8): 1000 mL
  Filled 2020-10-08 (×9): qty 1000

## 2020-10-08 MED ORDER — CARVEDILOL 12.5 MG PO TABS
12.5000 mg | ORAL_TABLET | Freq: Two times a day (BID) | ORAL | Status: DC
  Administered 2020-10-09 – 2020-10-15 (×13): 12.5 mg
  Filled 2020-10-08 (×14): qty 1

## 2020-10-08 MED ORDER — FUROSEMIDE 10 MG/ML IJ SOLN
40.0000 mg | Freq: Once | INTRAMUSCULAR | Status: AC
  Administered 2020-10-08: 40 mg via INTRAVENOUS
  Filled 2020-10-08: qty 4

## 2020-10-08 MED ORDER — SENNOSIDES-DOCUSATE SODIUM 8.6-50 MG PO TABS: 1.0000 | ORAL_TABLET | Freq: Every evening | ORAL | Status: DC | PRN

## 2020-10-08 MED ORDER — ACETAMINOPHEN 160 MG/5ML PO SOLN: 1000.0000 mg | Freq: Once | ORAL | Status: DC

## 2020-10-08 MED ORDER — DOPAMINE-DEXTROSE 3.2-5 MG/ML-% IV SOLN: 2.5000 ug/kg/min | INTRAVENOUS | Status: DC

## 2020-10-08 MED ORDER — ATORVASTATIN CALCIUM 40 MG PO TABS
40.0000 mg | ORAL_TABLET | Freq: Every day | ORAL | Status: DC
  Administered 2020-10-08 – 2020-10-14 (×7): 40 mg
  Filled 2020-10-08 (×8): qty 1

## 2020-10-08 NOTE — Progress Notes (Signed)
NAME:  Tommy Perez, MRN:  [id number], DOB:  [birth year], LOS: 1 ADMISSION DATE:  10/18/2020, CONSULTATION DATE: 10/17/2020 REFERRING MD: Dr. Ophelia Charter, [facility] CHIEF COMPLAINT: Status post cardiac arrest  Brief History:  55 yo male smoker admitted to [facility] 2/09 with Rt ACA CVA and HTN emergency with acute diastolic CHF.  He was to be d/c to CIR, but opted to go home.  he presented to ER on 2/16 with garbled speech and leg weakness.  Developed bradycardia leading to PEA arrest on 2/16 with ROSC after 21 minutes.  Found to have aspiration pneumonitis.  Past Medical History:  HTN, DM type 2  Significant Hospital Events:  2/09 Admit to [facility] 2/15 d/c home 2/16 admit to [facility], PEA arrest from aspiration pneumonitis  Consults:    Significant Diagnostic Tests:   CT head 2/16 >> atrophy, periventricular small vessel disease, prior infarcts at several sites  Abd u/s 2/17 >> cholelithiasis w/o acute cholecystitis, b/l effusions Rt > Lt, trace ascites, medical renal disease  Micro Data:  MRSA PCR 2/16 >> negative Sputum 2/17 >>   Antimicrobials:  Levaquin 2/17 >>   Subjective/Interim history:  Remains on vent, sedation.  Objective   Blood pressure 124/79, pulse 79, temperature 98.78 F (37.1 C), resp. rate 18, weight 76.2 kg, SpO2 100 %.    Vent Mode: PRVC FiO2 (%):  [40 %-100 %] 40 % Set Rate:  [18 bmp] 18 bmp Vt Set:  [580 mL] 580 mL PEEP:  [5 cmH20] 5 cmH20 Plateau Pressure:  [19 cmH20-23 cmH20] 19 cmH20   Intake/Output Summary (Last 24 hours) at 10/08/2020 0911 Last data filed at 10/08/2020 0900 Gross per 24 hour  Intake 1932.65 ml  Output 1543 ml  Net 389.65 ml   Filed Weights   10/06/2020 2245  Weight: 76.2 kg    Examination:  General - sedated Eyes - pupils reactive ENT - ETT in place Cardiac - regular rate/rhythm, no murmur Chest - b/l crackles Abdomen - soft, non tender, + bowel sounds Extremities - no cyanosis, clubbing, or edema Skin - venous stasis changes,  onychomycosis of both feet Neuro - RASS -1, follows simple commands  Resolved problems:  PEA cardiac arrest  Assessment & Plan:   Acute hypoxic respiratory failure from aspiration pneumonitis, acute pulmonary edema, b/l transudate pleural effusions. Tobacco abuse. - full vent support - f/u CXR - hx of PCN allergy; start levaquin 2/17 - nicotine patch  Acute on chronic diastolic CHF. Hx of HTN, HLD. - lasix 40 mg IV x one on 2/17 - goal SBP < 160 - continue norvasc, lipitor, coreg, hydralazine  Acute metabolic encephalopathy after cardiac arrest with recent CVA. - RASS goal 0 to -1 - continue ASA, plavix  CKD 3b. - f/u BMET - monitor urine outpt  Elevated LFTs 2nd to hypoxia. - abdominal u/s unremarkable for acute process - f/u LFTs  Severe protein calorie malnutrition. - tube feeds  DM type 2 poorly controlled with hyperglycemia. - SSI - hold outpt jardiance, metformin  Social determinants of health. - spoke with pt's cousin; she would like to continue process for listing her as POA >> will consult social work to assist with this process  Medical sales representative (evaluated daily)  Diet: tube feeds DVT prophylaxis: Lovenox GI prophylaxis: Protonix Mobility: Bedrest Disposition: ICU  Goals of Care:  Code Status: Full code  Labs    CMP Latest Ref Rng & Units 10/08/2020 10/08/2020 09/25/2020  Glucose 70 - 99 mg/dL 240(X) - 735(H)  BUN 6 -  20 mg/dL 67(Y) - 19(J)  Creatinine 0.61 - 1.24 mg/dL 0.93(O) - 6.71(I)  Sodium 135 - 145 mmol/L 139 141 140  Potassium 3.5 - 5.1 mmol/L 3.9 3.7 4.7  Chloride 98 - 111 mmol/L 108 - 105  CO2 22 - 32 mmol/L 23 - -  Calcium 8.9 - 10.3 mg/dL 7.3(L) - -  Total Protein 6.5 - 8.1 g/dL 4.8(L) - -  Total Bilirubin 0.3 - 1.2 mg/dL 0.5 - -  Alkaline Phos 38 - 126 U/L 139(H) - -  AST 15 - 41 U/L 164(H) - -  ALT 0 - 44 U/L 109(H) - -    CBC Latest Ref Rng & Units 10/08/2020 10/10/20 2020/10/10  WBC 4.0 - 10.5 K/uL - - 5.6  Hemoglobin 13.0 -  17.0 g/dL 12.2(L) 13.6 12.2(L)  Hematocrit 39.0 - 52.0 % 36.0(L) 40.0 39.8  Platelets 150 - 400 K/uL - - 179    ABG    Component Value Date/Time   PHART 7.326 (L) 10/08/2020 0010   PCO2ART 42.4 10/08/2020 0010   PO2ART 295 (H) 10/08/2020 0010   HCO3 24.0 10/08/2020 0010   TCO2 26 10/08/2020 0010   ACIDBASEDEF 4.0 (H) 10/08/2020 0010   O2SAT 100.0 10/08/2020 0010    CBG (last 3)  Recent Labs    10-10-20 2247 10/08/20 0329 10/08/20 0738  GLUCAP 203* 140* 114*    Critical care time: 36 minutes  Coralyn Helling, MD [place] Pulmonary/Critical Care Pager - [phone] 10/08/2020, 9:41 AM

## 2020-10-08 NOTE — Progress Notes (Signed)
CRITICAL VALUE ALERT  Critical Value:  Troponin 159  Date & Time Notied:  10/08/20 0306  Provider Notified: Dr. Warrick Parisian  Orders Received/Actions taken: Notify MD of second troponin result

## 2020-10-08 NOTE — Progress Notes (Addendum)
Pt arrived to 3M06 at 2230. Pt intubated and following commands yet RASS +2 d/t no sedation. Pt SB with HR in 40s-50s and pt is hypothermic. Bear hugger will be applied. Elink notified of arrival. Report received from [facility]. Will continue to monitor.

## 2020-10-08 NOTE — Progress Notes (Signed)
eLink Physician-Brief Progress Note Patient Name: Tommy Perez DOB: 1966/01/06 MRN: [id number]   Date of Service  10/08/2020  HPI/Events of Note  Heart rate 48, BP 97/71, MAP 80  eICU Interventions  Will monitor for now, if MAP drops below 65 or heart rate drops below 40 will start fixed dose Dopamine at 2.5 mcg / kg / minute.        [name] Schneider W[place]ol 10/08/2020, 12:13 AM

## 2020-10-08 NOTE — Progress Notes (Signed)
Inpatient Rehab Admissions Coordinator:   Note pt with PEA arrest yesterday and now intubated in ICU.  Orders for CIR discontinued and we will follow from a distance for improvement and therapy re-evaluations when appropriate.   Estill Dooms, PT, DPT Admissions Coordinator [phone] 10/08/20  10:16 AM

## 2020-10-08 NOTE — Progress Notes (Signed)
eLink Physician-Brief Progress Note Patient Name: Tommy Perez DOB: [birth year] MRN: [id number]   Date of Service  10/08/2020  HPI/Events of Note  Blood pressure remains soft despite the increase in heart rate to the mid-fifties, Map however remains satisfactory at 73 mmHg. Patient has normal LV function on echo.  eICU Interventions  A  Second 250 ml LR bolus has been ordered.        [name] Ogan 10/08/2020, 2:34 AM

## 2020-10-08 NOTE — Progress Notes (Signed)
eLink Physician-Brief Progress Note Patient Name: Manoj Enriquez DOB: 1966-02-13 MRN: [id number]   Date of Service  10/08/2020  HPI/Events of Note  BP 85/66, MAP 73, HR 50  eICU Interventions  LR 250 ml iv fluid bolus over 30 minutes ordered.        [name] Nikitha Mode 10/08/2020, 12:59 AM

## 2020-10-08 NOTE — Progress Notes (Signed)
OT Cancellation Note  Patient Details Name: Cuthbert Turton MRN: [id number] DOB: 1966/05/26   Cancelled Treatment:    Reason Eval/Treat Not Completed: Medical issues which prohibited therapy  Burnett Corrente Josephus Harriger, OT/L   Acute OT Clinical Specialist Acute Rehabilitation Services Pager [phone] Office [phone]  10/08/2020, 8:11 AM

## 2020-10-08 NOTE — Progress Notes (Signed)
Pt family members [name], cousin, and Jaquita Rector, visiting pt at this time. All questions and concern at this time were addressed. PW is mophar nocar.[name] will be pt's designated support person during this admission. Clifton Custard also took pt's belongings home.

## 2020-10-08 NOTE — Progress Notes (Signed)
SLP Cancellation Note  Patient Details Name: Tommy Perez MRN: [id number] DOB: 11-15-1965   Cancelled treatment:       Reason Eval/Treat Not Completed: Medical issues which prohibited therapy (Pt is currently intubated. SLP will follow up.)  [name], MS, CCC-SLP Acute Rehabilitation Services Office number [phone] Pager [phone]  [name] 10/08/2020, 8:47 AM

## 2020-10-08 NOTE — Progress Notes (Signed)
PT Cancellation Note  Patient Details Name: Corinthian Mizrahi MRN: [id number] DOB: 1966/03/07   Cancelled Treatment:    Reason Eval/Treat Not Completed: Patient not medically ready (pt s/p cardiac arrest and intubated 2/16, not yet medically appropriate)   Hollyn Stucky B Hamdi Kley 10/08/2020, 7:02 AM  Merryl Hacker, PT Acute Rehabilitation Services Pager: [phone] Office: [phone]

## 2020-10-08 NOTE — Progress Notes (Signed)
Initial Nutrition Assessment  DOCUMENTATION CODES:   Not applicable  INTERVENTION:   Tube Feeding via OG: Vital AF 1.2 at 55 ml/hr Provides 99 g of protein, 1584 kcals, 1069 mL of free water   TF regimen and propofol at current rate providing 1795 total kcal/day    NUTRITION DIAGNOSIS:   Inadequate oral intake related to acute illness as evidenced by NPO status.  GOAL:   Patient will meet greater than or equal to 90% of their needs  MONITOR:   Vent status,TF tolerance,Weight trends,Labs  REASON FOR ASSESSMENT:   Consult,Ventilator Enteral/tube feeding initiation and management  ASSESSMENT:   55 yo male admitted post recent CVA with cardiac arrest likely from aspiration event. PMH includes DM, HTN  2/09 Admit to [facility] with CVA, HTN emergency 2/15 D/C to home 2/16 Admit to [facility], PEA arrest, intubated  Patient is currently intubated on ventilator support MV: 10.4 L/min Temp (24hrs), Avg:92.9 F (33.8 C), Min:85.28 F (29.6 C), Max:98.78 F (37.1 C)  Propofol: 4 ml/hr  OG tube with tip in stomach  Labs: reviewed Meds: ss novolog   Diet Order:   Diet Order            Diet NPO time specified  Diet effective now                 EDUCATION NEEDS:   Not appropriate for education at this time  Skin:  Skin Assessment: Reviewed RN Assessment  Last BM:  2/15  Height:   Ht Readings from Last 1 Encounters:  09/29/20 5\' 11"  (1.803 m)    Weight:   Wt Readings from Last 1 Encounters:  October 30, 2020 76.2 kg     BMI:  Body mass index is 23.43 kg/m.  Estimated Nutritional Needs:   Kcal:  1620 kcals  Protein:  100-115 g  Fluid:  >/= 1.9 L  10/09/20 MS, RDN, LDN, CNSC Registered Dietitian III Clinical Nutrition RD Pager and On-Call Pager Number Located in [place]

## 2020-10-08 NOTE — Progress Notes (Signed)
eLink Physician-Brief Progress Note Patient Name: Tommy Perez DOB: 20-May-1966 MRN: [id number]   Date of Service  10/08/2020  HPI/Events of Note  Sternal soreness s/p CPR.  eICU Interventions  Tylenol 1000 mg via NG tube x 1 now. Patient has PRN Tylenol 650 mg Q 4 orders for subsequent administration.        [name] 10/08/2020, 3:27 AM

## 2020-10-09 ENCOUNTER — Inpatient Hospital Stay (HOSPITAL_COMMUNITY): Payer: 59

## 2020-10-09 DIAGNOSIS — J690 Pneumonitis due to inhalation of food and vomit: Secondary | ICD-10-CM | POA: Diagnosis not present

## 2020-10-09 LAB — GLUCOSE, CAPILLARY
Glucose-Capillary: 107 mg/dL — ABNORMAL HIGH (ref 70–99)
Glucose-Capillary: 114 mg/dL — ABNORMAL HIGH (ref 70–99)
Glucose-Capillary: 130 mg/dL — ABNORMAL HIGH (ref 70–99)
Glucose-Capillary: 130 mg/dL — ABNORMAL HIGH (ref 70–99)
Glucose-Capillary: 133 mg/dL — ABNORMAL HIGH (ref 70–99)
Glucose-Capillary: 90 mg/dL (ref 70–99)

## 2020-10-09 LAB — COMPREHENSIVE METABOLIC PANEL
ALT: 68 U/L — ABNORMAL HIGH (ref 0–44)
AST: 53 U/L — ABNORMAL HIGH (ref 15–41)
Albumin: 1.5 g/dL — ABNORMAL LOW (ref 3.5–5.0)
Alkaline Phosphatase: 114 U/L (ref 38–126)
Anion gap: 8 (ref 5–15)
BUN: 46 mg/dL — ABNORMAL HIGH (ref 6–20)
CO2: 23 mmol/L (ref 22–32)
Calcium: 6.9 mg/dL — ABNORMAL LOW (ref 8.9–10.3)
Chloride: 108 mmol/L (ref 98–111)
Creatinine, Ser: 2.7 mg/dL — ABNORMAL HIGH (ref 0.61–1.24)
GFR, Estimated: 27 mL/min — ABNORMAL LOW (ref >60)
Glucose, Bld: 121 mg/dL — ABNORMAL HIGH (ref 70–99)
Potassium: 3.8 mmol/L (ref 3.5–5.1)
Sodium: 139 mmol/L (ref 135–145)
Total Bilirubin: 1 mg/dL (ref 0.3–1.2)
Total Protein: 4.5 g/dL — ABNORMAL LOW (ref 6.5–8.1)

## 2020-10-09 LAB — CBC
HCT: 32.8 % — ABNORMAL LOW (ref 39.0–52.0)
Hemoglobin: 10.5 g/dL — ABNORMAL LOW (ref 13.0–17.0)
MCH: 26.9 pg (ref 26.0–34.0)
MCHC: 32 g/dL (ref 30.0–36.0)
MCV: 83.9 fL (ref 80.0–100.0)
Platelets: 142 10*3/uL — ABNORMAL LOW (ref 150–400)
RBC: 3.91 MIL/uL — ABNORMAL LOW (ref 4.22–5.81)
RDW: 16.2 % — ABNORMAL HIGH (ref 11.5–15.5)
WBC: 16.8 10*3/uL — ABNORMAL HIGH (ref 4.0–10.5)
nRBC: 0 % (ref 0.0–0.2)

## 2020-10-09 LAB — MAGNESIUM: Magnesium: 1.9 mg/dL (ref 1.7–2.4)

## 2020-10-09 LAB — PHOSPHORUS: Phosphorus: 3.8 mg/dL (ref 2.5–4.6)

## 2020-10-09 MED ORDER — POTASSIUM CHLORIDE CRYS ER 20 MEQ PO TBCR: 40.0000 meq | EXTENDED_RELEASE_TABLET | Freq: Once | ORAL | Status: DC

## 2020-10-09 MED ORDER — FUROSEMIDE 10 MG/ML IJ SOLN
40.0000 mg | Freq: Four times a day (QID) | INTRAMUSCULAR | Status: AC
Start: 2020-10-09 — End: 2020-10-09
  Administered 2020-10-09 (×2): 40 mg via INTRAVENOUS
  Filled 2020-10-09 (×2): qty 4

## 2020-10-09 MED ORDER — POTASSIUM CHLORIDE 20 MEQ PO PACK
40.0000 meq | PACK | Freq: Once | ORAL | Status: AC
  Administered 2020-10-09: 40 meq
  Filled 2020-10-09: qty 2

## 2020-10-09 NOTE — Progress Notes (Signed)
NAME:  Tommy Perez, MRN:  [id number], DOB:  August 07, 1966, LOS: 2 ADMISSION DATE:  10-18-20, CONSULTATION DATE: 10/18/20 REFERRING MD: Dr. Ophelia Charter, [facility] CHIEF COMPLAINT: Status post cardiac arrest  Brief History:  55 yo male smoker admitted to [facility] 2/09 with Rt ACA CVA and HTN emergency with acute diastolic CHF.  He was to be d/c to CIR, but opted to go home.  he presented to ER on 2/16 with garbled speech and leg weakness.  Developed bradycardia leading to PEA arrest on 2/16 with ROSC after 21 minutes.  Found to have aspiration pneumonitis.  Past Medical History:  HTN, DM type 2  Significant Hospital Events:  2/09 Admit to [facility] 2/15 d/c home 2/16 admit to [facility], PEA arrest from aspiration pneumonitis  Consults:    Significant Diagnostic Tests:   CT head 2/16 >> atrophy, periventricular small vessel disease, prior infarcts at several sites  Abd u/s 2/17 >> cholelithiasis w/o acute cholecystitis, b/l effusions Rt > Lt, trace ascites, medical renal disease  Micro Data:  MRSA PCR 2/16 >> negative Sputum 2/17 >>   Antimicrobials:  Levaquin 2/17 >>   Subjective/Interim history:  Remains on vent, sedation.  Objective   Blood pressure 132/73, pulse 71, temperature (!) 97.5 F (36.4 C), temperature source Oral, resp. rate 18, weight 78.8 kg, SpO2 100 %.    Vent Mode: PRVC FiO2 (%):  [40 %] 40 % Set Rate:  [18 bmp] 18 bmp Vt Set:  [580 mL] 580 mL PEEP:  [5 cmH20] 5 cmH20 Plateau Pressure:  [18 cmH20-23 cmH20] 20 cmH20   Intake/Output Summary (Last 24 hours) at 10/09/2020 0856 Last data filed at 10/09/2020 6213 Gross per 24 hour  Intake 1070.44 ml  Output 1125 ml  Net -54.56 ml   Filed Weights   10/18/2020 2245 10/09/20 0530  Weight: 76.2 kg 78.8 kg    Examination:  General - sedated Eyes - pupils reactive ENT - ETT in place Cardiac - regular rate/rhythm, no murmur Chest - b/l crackles Abdomen - soft, non tender, + bowel sounds Extremities - 1+ edema Skin - venous  stasis changes, onychomycosis of toes Neuro - RASS -2   Resolved problems:  PEA cardiac arrest  Assessment & Plan:   Acute hypoxic respiratory failure from aspiration pneumonitis, acute pulmonary edema, b/l transudate pleural effusions. Tobacco abuse. - full vent support - goal SpO2 > 92% - day 2 of levaquin; has PCN allergy - nicotine patch - f/u CXR - lasix 40 mg IV x two on 2/18; if pleural effusion doesn't improve, then might need repeat thoracentesis  Acute on chronic diastolic CHF. Hx of HTN, HLD. - goal SBP < 160 - continue norvasc, lipitor, coreg, hydralazine  Acute metabolic encephalopathy after cardiac arrest with recent CVA. - RASS goal 0 to -1 - continue ASA, plavix  CKD 3b. - f/u BMET - monitor urine outpt  Elevated LFTs 2nd to hypoxia. - abdominal u/s unremarkable for acute process - improving - f/u LFTs intermittently  Severe protein calorie malnutrition. - tube feeds  DM type 2 poorly controlled with hyperglycemia. - SSI - hold outpt jardiance, metformin  Social determinants of health. - spoke with pt's cousin; she would like to continue process for listing her as POA >> consulted social work to assist with this process  Medical sales representative (evaluated daily)  Diet: tube feeds DVT prophylaxis: Lovenox GI prophylaxis: Protonix Mobility: Bedrest Disposition: ICU  Goals of Care:  Code Status: Full code  Updated pt's cousin at bedside.  Labs  CMP Latest Ref Rng & Units 10/09/2020 10/08/2020 10/08/2020  Glucose 70 - 99 mg/dL 270(B) 867(J) -  BUN 6 - 20 mg/dL 44(B) 20(F) -  Creatinine 0.61 - 1.24 mg/dL 0.07(H) 2.19(X) -  Sodium 135 - 145 mmol/L 139 139 141  Potassium 3.5 - 5.1 mmol/L 3.8 3.9 3.7  Chloride 98 - 111 mmol/L 108 108 -  CO2 22 - 32 mmol/L 23 23 -  Calcium 8.9 - 10.3 mg/dL 6.9(L) 7.3(L) -  Total Protein 6.5 - 8.1 g/dL 5.8(I) 4.8(L) -  Total Bilirubin 0.3 - 1.2 mg/dL 1.0 0.5 -  Alkaline Phos 38 - 126 U/L 114 139(H) -  AST 15 - 41 U/L  53(H) 164(H) -  ALT 0 - 44 U/L 68(H) 109(H) -    CBC Latest Ref Rng & Units 10/09/2020 10/08/2020 10-23-2020  WBC 4.0 - 10.5 K/uL 16.8(H) - -  Hemoglobin 13.0 - 17.0 g/dL 10.5(L) 12.2(L) 13.6  Hematocrit 39.0 - 52.0 % 32.8(L) 36.0(L) 40.0  Platelets 150 - 400 K/uL 142(L) - -    ABG    Component Value Date/Time   PHART 7.326 (L) 10/08/2020 0010   PCO2ART 42.4 10/08/2020 0010   PO2ART 295 (H) 10/08/2020 0010   HCO3 24.0 10/08/2020 0010   TCO2 26 10/08/2020 0010   ACIDBASEDEF 4.0 (H) 10/08/2020 0010   O2SAT 100.0 10/08/2020 0010    CBG (last 3)  Recent Labs    10/08/20 2357 10/09/20 0502 10/09/20 0720  GLUCAP 109* 90 130*    Critical care time: 32 minutes  Coralyn Helling, MD [place] Pulmonary/Critical Care Pager - [phone] 10/09/2020, 8:56 AM

## 2020-10-10 ENCOUNTER — Inpatient Hospital Stay (HOSPITAL_COMMUNITY): Payer: 59

## 2020-10-10 DIAGNOSIS — J9601 Acute respiratory failure with hypoxia: Secondary | ICD-10-CM | POA: Diagnosis not present

## 2020-10-10 DIAGNOSIS — J690 Pneumonitis due to inhalation of food and vomit: Secondary | ICD-10-CM | POA: Diagnosis not present

## 2020-10-10 LAB — CULTURE, RESPIRATORY W GRAM STAIN

## 2020-10-10 LAB — BASIC METABOLIC PANEL
Anion gap: 10 (ref 5–15)
BUN: 48 mg/dL — ABNORMAL HIGH (ref 6–20)
CO2: 24 mmol/L (ref 22–32)
Calcium: 7.2 mg/dL — ABNORMAL LOW (ref 8.9–10.3)
Chloride: 106 mmol/L (ref 98–111)
Creatinine, Ser: 2.87 mg/dL — ABNORMAL HIGH (ref 0.61–1.24)
GFR, Estimated: 25 mL/min — ABNORMAL LOW (ref >60)
Glucose, Bld: 168 mg/dL — ABNORMAL HIGH (ref 70–99)
Potassium: 4 mmol/L (ref 3.5–5.1)
Sodium: 140 mmol/L (ref 135–145)

## 2020-10-10 LAB — CBC
HCT: 31.5 % — ABNORMAL LOW (ref 39.0–52.0)
Hemoglobin: 10.5 g/dL — ABNORMAL LOW (ref 13.0–17.0)
MCH: 27.8 pg (ref 26.0–34.0)
MCHC: 33.3 g/dL (ref 30.0–36.0)
MCV: 83.3 fL (ref 80.0–100.0)
Platelets: 123 10*3/uL — ABNORMAL LOW (ref 150–400)
RBC: 3.78 MIL/uL — ABNORMAL LOW (ref 4.22–5.81)
RDW: 16.4 % — ABNORMAL HIGH (ref 11.5–15.5)
WBC: 15.1 10*3/uL — ABNORMAL HIGH (ref 4.0–10.5)
nRBC: 0 % (ref 0.0–0.2)

## 2020-10-10 LAB — GLUCOSE, CAPILLARY
Glucose-Capillary: 142 mg/dL — ABNORMAL HIGH (ref 70–99)
Glucose-Capillary: 150 mg/dL — ABNORMAL HIGH (ref 70–99)
Glucose-Capillary: 148 mg/dL — ABNORMAL HIGH (ref 70–99)
Glucose-Capillary: 115 mg/dL — ABNORMAL HIGH (ref 70–99)
Glucose-Capillary: 141 mg/dL — ABNORMAL HIGH (ref 70–99)
Glucose-Capillary: 147 mg/dL — ABNORMAL HIGH (ref 70–99)

## 2020-10-10 LAB — MAGNESIUM: Magnesium: 1.7 mg/dL (ref 1.7–2.4)

## 2020-10-10 MED ORDER — NICOTINE 7 MG/24HR TD PT24
7.0000 mg | MEDICATED_PATCH | Freq: Every day | TRANSDERMAL | Status: DC
  Administered 2020-10-11 – 2020-10-20 (×10): 7 mg via TRANSDERMAL
  Filled 2020-10-10 (×10): qty 1

## 2020-10-10 MED ORDER — FUROSEMIDE 10 MG/ML IJ SOLN
40.0000 mg | Freq: Once | INTRAMUSCULAR | Status: AC
  Administered 2020-10-10: 40 mg via INTRAVENOUS
  Filled 2020-10-10: qty 4

## 2020-10-10 NOTE — Progress Notes (Signed)
NAME:  Tommy Perez, MRN:  [id number], DOB:  Nov 22, 1965, LOS: 3 ADMISSION DATE:  10/15/2020, CONSULTATION DATE: 10/10/2020 REFERRING MD: Dr. Ophelia Charter, [facility] CHIEF COMPLAINT: Status post cardiac arrest  Brief History:  55 yo male smoker admitted to [facility] 2/09 with Rt ACA CVA and HTN emergency with acute diastolic CHF.  He was to be d/c to CIR, but opted to go home.  he presented to ER on 2/16 with garbled speech and leg weakness.  Developed bradycardia leading to PEA arrest on 2/16 with ROSC after 21 minutes.  Found to have aspiration pneumonitis.  Past Medical History:  HTN, DM type 2  Significant Hospital Events:  2/09 Admit to [facility] 2/15 d/c home 2/16 admit to [facility], PEA arrest from aspiration pneumonitis  Consults:    Significant Diagnostic Tests:   CT head 2/16 >> atrophy, periventricular small vessel disease, prior infarcts at several sites  Abd u/s 2/17 >> cholelithiasis w/o acute cholecystitis, b/l effusions Rt > Lt, trace ascites, medical renal disease  Micro Data:  MRSA PCR 2/16 >> negative Sputum 2/17 >>   Antimicrobials:  Levaquin 2/17 >>   Subjective/Interim history:  Pressure support.  Increased respiratory secretions still.  Remains on sedation.  Objective   Blood pressure 133/66, pulse 73, temperature 97.6 F (36.4 C), temperature source Oral, resp. rate (!) 22, weight 75.2 kg, SpO2 100 %.    Vent Mode: PRVC FiO2 (%):  [40 %] 40 % Set Rate:  [18 bmp] 18 bmp Vt Set:  [580 mL] 580 mL PEEP:  [5 cmH20] 5 cmH20 Plateau Pressure:  [18 cmH20-27 cmH20] 22 cmH20   Intake/Output Summary (Last 24 hours) at 10/10/2020 1052 Last data filed at 10/10/2020 0800 Gross per 24 hour  Intake 2311.07 ml  Output 2220 ml  Net 91.07 ml   Filed Weights   10/10/2020 2245 10/09/20 0530 10/10/20 0402  Weight: 76.2 kg 78.8 kg 75.2 kg    Examination:  General - sedated Eyes - pupils reactive ENT - ETT in place Cardiac - regular rate/rhythm, no murmur Chest - b/l rhonchi Abdomen  - soft, non tender, + bowel sounds Extremities - venous stasis changes Skin - onychomycosis of toes Neuro - RASS 0   Resolved problems:  PEA cardiac arrest  Assessment & Plan:   Acute hypoxic respiratory failure from aspiration pneumonitis, acute pulmonary edema, b/l transudate pleural effusions. Tobacco abuse. - pressure support - respiratory secretions prohibiting extubation trial at this time - f/u CXR - day 3 of levaquin; has PCN allergy - nicotine patch - f/u CXR - lasix 40 mg IV x one on 2/19  Acute on chronic diastolic CHF. Hx of HTN, HLD. - goal SBP < 160 - continue norvasc, lipitor, coreg, hydralazine  Acute metabolic encephalopathy after cardiac arrest with recent CVA. - RASS goal 0 to -1 - continue ASA, plavix  CKD 3b. - f/u BMET - monitor urine outpt  Elevated LFTs 2nd to hypoxia. - abdominal u/s unremarkable for acute process - improving - f/u LFTs  Severe protein calorie malnutrition. - tube feeds  DM type 2 poorly controlled with hyperglycemia. - SSI - hold outpt jardiance, metformin  Social determinants of health. - spoke with pt's cousin; she would like to continue process for listing her as POA >> consulted social work to assist with this process  Medical sales representative (evaluated daily)  Diet: tube feeds DVT prophylaxis: Lovenox GI prophylaxis: Protonix Mobility: Bedrest Disposition: ICU  Goals of Care:  Code Status: Full code  Labs  CMP Latest Ref Rng & Units 10/10/2020 10/09/2020 10/08/2020  Glucose 70 - 99 mg/dL 681(E) 751(Z) 001(V)  BUN 6 - 20 mg/dL 49(S) 49(Q) 75(F)  Creatinine 0.61 - 1.24 mg/dL 1.63(W) 4.66(Z) 9.93(T)  Sodium 135 - 145 mmol/L 140 139 139  Potassium 3.5 - 5.1 mmol/L 4.0 3.8 3.9  Chloride 98 - 111 mmol/L 106 108 108  CO2 22 - 32 mmol/L 24 23 23   Calcium 8.9 - 10.3 mg/dL 7.2(L) 6.9(L) 7.3(L)  Total Protein 6.5 - 8.1 g/dL - 4.5(L) 4.8(L)  Total Bilirubin 0.3 - 1.2 mg/dL - 1.0 0.5  Alkaline Phos 38 - 126 U/L - 114  139(H)  AST 15 - 41 U/L - 53(H) 164(H)  ALT 0 - 44 U/L - 68(H) 109(H)    CBC Latest Ref Rng & Units 10/10/2020 10/09/2020 10/08/2020  WBC 4.0 - 10.5 K/uL 15.1(H) 16.8(H) -  Hemoglobin 13.0 - 17.0 g/dL 10.5(L) 10.5(L) 12.2(L)  Hematocrit 39.0 - 52.0 % 31.5(L) 32.8(L) 36.0(L)  Platelets 150 - 400 K/uL 123(L) 142(L) -    ABG    Component Value Date/Time   PHART 7.326 (L) 10/08/2020 0010   PCO2ART 42.4 10/08/2020 0010   PO2ART 295 (H) 10/08/2020 0010   HCO3 24.0 10/08/2020 0010   TCO2 26 10/08/2020 0010   ACIDBASEDEF 4.0 (H) 10/08/2020 0010   O2SAT 100.0 10/08/2020 0010    CBG (last 3)  Recent Labs    10/09/20 2346 10/10/20 0336 10/10/20 0741  GLUCAP 130* 142* 150*    Critical care time: 33 minutes  10/12/20, MD [place] Pulmonary/Critical Care Pager - [phone] 10/10/2020, 10:52 AM

## 2020-10-11 ENCOUNTER — Inpatient Hospital Stay (HOSPITAL_COMMUNITY): Payer: 59

## 2020-10-11 DIAGNOSIS — J9601 Acute respiratory failure with hypoxia: Secondary | ICD-10-CM | POA: Diagnosis not present

## 2020-10-11 DIAGNOSIS — T17500A Unspecified foreign body in bronchus causing asphyxiation, initial encounter: Secondary | ICD-10-CM

## 2020-10-11 DIAGNOSIS — J690 Pneumonitis due to inhalation of food and vomit: Secondary | ICD-10-CM | POA: Diagnosis not present

## 2020-10-11 LAB — COMPREHENSIVE METABOLIC PANEL
ALT: 80 U/L — ABNORMAL HIGH (ref 0–44)
AST: 71 U/L — ABNORMAL HIGH (ref 15–41)
Albumin: 1.4 g/dL — ABNORMAL LOW (ref 3.5–5.0)
Alkaline Phosphatase: 288 U/L — ABNORMAL HIGH (ref 38–126)
Anion gap: 12 (ref 5–15)
BUN: 51 mg/dL — ABNORMAL HIGH (ref 6–20)
CO2: 25 mmol/L (ref 22–32)
Calcium: 7.6 mg/dL — ABNORMAL LOW (ref 8.9–10.3)
Chloride: 107 mmol/L (ref 98–111)
Creatinine, Ser: 2.97 mg/dL — ABNORMAL HIGH (ref 0.61–1.24)
GFR, Estimated: 24 mL/min — ABNORMAL LOW (ref >60)
Glucose, Bld: 153 mg/dL — ABNORMAL HIGH (ref 70–99)
Potassium: 4.1 mmol/L (ref 3.5–5.1)
Sodium: 144 mmol/L (ref 135–145)
Total Bilirubin: 0.6 mg/dL (ref 0.3–1.2)
Total Protein: 5.2 g/dL — ABNORMAL LOW (ref 6.5–8.1)

## 2020-10-11 LAB — CBC
HCT: 29.4 % — ABNORMAL LOW (ref 39.0–52.0)
Hemoglobin: 9.8 g/dL — ABNORMAL LOW (ref 13.0–17.0)
MCH: 27.5 pg (ref 26.0–34.0)
MCHC: 33.3 g/dL (ref 30.0–36.0)
MCV: 82.4 fL (ref 80.0–100.0)
Platelets: 164 10*3/uL (ref 150–400)
RBC: 3.57 MIL/uL — ABNORMAL LOW (ref 4.22–5.81)
RDW: 16.3 % — ABNORMAL HIGH (ref 11.5–15.5)
WBC: 13.9 10*3/uL — ABNORMAL HIGH (ref 4.0–10.5)
nRBC: 0.1 % (ref 0.0–0.2)

## 2020-10-11 LAB — GLUCOSE, CAPILLARY
Glucose-Capillary: 111 mg/dL — ABNORMAL HIGH (ref 70–99)
Glucose-Capillary: 135 mg/dL — ABNORMAL HIGH (ref 70–99)
Glucose-Capillary: 173 mg/dL — ABNORMAL HIGH (ref 70–99)
Glucose-Capillary: 169 mg/dL — ABNORMAL HIGH (ref 70–99)
Glucose-Capillary: 139 mg/dL — ABNORMAL HIGH (ref 70–99)
Glucose-Capillary: 126 mg/dL — ABNORMAL HIGH (ref 70–99)

## 2020-10-11 LAB — MAGNESIUM: Magnesium: 1.7 mg/dL (ref 1.7–2.4)

## 2020-10-11 LAB — TRIGLYCERIDES: Triglycerides: 77 mg/dL (ref <150)

## 2020-10-11 MED ORDER — ENOXAPARIN SODIUM 30 MG/0.3ML ~~LOC~~ SOLN
30.0000 mg | SUBCUTANEOUS | Status: DC
  Administered 2020-10-11 – 2020-10-19 (×9): 30 mg via SUBCUTANEOUS
  Filled 2020-10-11 (×9): qty 0.3

## 2020-10-11 MED ORDER — LEVOFLOXACIN IN D5W 500 MG/100ML IV SOLN: 500.0000 mg | INTRAVENOUS | Status: DC

## 2020-10-11 MED ORDER — LEVOFLOXACIN IN D5W 750 MG/150ML IV SOLN
750.0000 mg | INTRAVENOUS | Status: DC
  Administered 2020-10-12: 750 mg via INTRAVENOUS
  Filled 2020-10-11: qty 150

## 2020-10-11 NOTE — Progress Notes (Signed)
NAME:  Tommy Perez, MRN:  [id number], DOB:  1966/02/19, LOS: 4 ADMISSION DATE:  10/18/2020, CONSULTATION DATE: 10/14/2020 REFERRING MD: Dr. Lorin Perez, [facility] CHIEF COMPLAINT: Status post cardiac arrest  Brief History:  55 yo male smoker admitted to [facility] 2/09 with Rt ACA CVA and HTN emergency with acute diastolic CHF.  He was to be d/c to CIR, but opted to go home.  he presented to ER on 2/16 with garbled speech and leg weakness.  Developed bradycardia leading to PEA arrest on 2/16 with ROSC after 21 minutes.  Found to have aspiration pneumonitis.  Past Medical History:  HTN, DM type 2  Significant Hospital Events:  2/09 Admit to [facility] 2/15 d/c home 2/16 admit to [facility], PEA arrest from aspiration pneumonitis  Consults:    Significant Diagnostic Tests:   CT head 2/16 >> atrophy, periventricular small vessel disease, prior infarcts at several sites  Abd u/s 2/17 >> cholelithiasis w/o acute cholecystitis, b/l effusions Rt > Lt, trace ascites, medical renal disease  Bronchoscopy 2/20 >>   Micro Data:  MRSA PCR 2/16 >> negative Sputum 2/17 >>  BAL 2/20 >>  Antimicrobials:  Levaquin 2/17 >>   Subjective/Interim history:  Still has increased respiratory secretions.  Objective   Blood pressure (!) 148/78, pulse 90, temperature 98.9 F (37.2 C), temperature source Oral, resp. rate 20, weight 72.4 kg, SpO2 99 %.    Vent Mode: PRVC FiO2 (%):  [30 %] 30 % Set Rate:  [18 bmp] 18 bmp Vt Set:  [580 mL] 580 mL PEEP:  [5 cmH20-15 cmH20] 5 cmH20 Plateau Pressure:  [18 cmH20-22 cmH20] 21 cmH20   Intake/Output Summary (Last 24 hours) at 10/11/2020 0855 Last data filed at 10/11/2020 4132 Gross per 24 hour  Intake 1641.25 ml  Output 2725 ml  Net -1083.75 ml   Filed Weights   10/09/20 0530 10/10/20 0402 10/11/20 0402  Weight: 78.8 kg 75.2 kg 72.4 kg    Examination:  General - on vent Eyes - pupils reactive ENT - no sinus tenderness, no stridor Cardiac - regular rate/rhythm, no  murmur Chest - scattered rhonchi, decreased BS Rt base Abdomen - soft, non tender, + bowel sounds Extremities - no cyanosis, clubbing, or edema Skin - no rashes Neuro - RASS 0  Resolved problems:  PEA cardiac arrest  Assessment & Plan:   Acute hypoxic respiratory failure from aspiration pneumonitis, acute pulmonary edema, b/l transudate pleural effusions. Mucus plugging of RML and RLL. Tobacco abuse. - pressure support as able - arrange for bedside bronchoscopy on 2/20; d/w pt's cousin >> risks/benefits reviewed - f/u CXR - day 4 of levaquin; has PCN allergy - nicotine patch  Acute on chronic diastolic CHF. Hx of HTN, HLD. - goal SBP < 160 - continue norvasc, lipitor, coreg, hydralazine  Acute metabolic encephalopathy after cardiac arrest with recent CVA. - RASS goal 0 - continue ASA, plavix  CKD 3b. - f/u BMET - monitor urine outpt  Elevated LFTs 2nd to hypoxia. - abdominal u/s unremarkable for acute process - f/u LFTs intermittently  Severe protein calorie malnutrition. - tube feeds  DM type 2 poorly controlled with hyperglycemia. -SSI - hold outpt jardiance, metformin  Social determinants of health. - spoke with pt's cousin; she would like to continue process for listing her as POA >> consulted social work to assist with this process  Building surveyor (evaluated daily)  Diet: tube feeds DVT prophylaxis: Lovenox GI prophylaxis: Protonix Mobility: Bedrest Disposition: ICU  Updated pt's cousin at bedside.  Goals of Care:  Code Status: Full code  Labs    CMP Latest Ref Rng & Units 10/11/2020 10/10/2020 10/09/2020  Glucose 70 - 99 mg/dL 153(H) 168(H) 121(H)  BUN 6 - 20 mg/dL 51(H) 48(H) 46(H)  Creatinine 0.61 - 1.24 mg/dL 2.97(H) 2.87(H) 2.70(H)  Sodium 135 - 145 mmol/L 144 140 139  Potassium 3.5 - 5.1 mmol/L 4.1 4.0 3.8  Chloride 98 - 111 mmol/L 107 106 108  CO2 22 - 32 mmol/L _0 Calcium 8.9 - 10.3 mg/dL 7.6(L) 7.2(L) 6.9(L)  Total Protein 6.5 -  8.1 g/dL 5.2(L) - 4.5(L)  Total Bilirubin 0.3 - 1.2 mg/dL 0.6 - 1.0  Alkaline Phos 38 - 126 U/L 288(H) - 114  AST 15 - 41 U/L 71(H) - 53(H)  ALT 0 - 44 U/L 80(H) - 68(H)    CBC Latest Ref Rng & Units 10/11/2020 10/10/2020 10/09/2020  WBC 4.0 - 10.5 K/uL 13.9(H) 15.1(H) 16.8(H)  Hemoglobin 13.0 - 17.0 g/dL 9.8(L) 10.5(L) 10.5(L)  Hematocrit 39.0 - 52.0 % 29.4(L) 31.5(L) 32.8(L)  Platelets 150 - 400 K/uL 164 123(L) 142(L)    ABG    Component Value Date/Time   PHART 7.326 (L) 10/08/2020 0010   PCO2ART 42.4 10/08/2020 0010   PO2ART 295 (H) 10/08/2020 0010   HCO3 24.0 10/08/2020 0010   TCO2 26 10/08/2020 0010   ACIDBASEDEF 4.0 (H) 10/08/2020 0010   O2SAT 100.0 10/08/2020 0010    CBG (last 3)  Recent Labs    10/10/20 2326 10/11/20 0319 10/11/20 0737  GLUCAP 147* 111* 135*    Critical care time: 32 minutes  Chesley Mires, MD [place] Pager - [phone] 10/11/2020, 8:55 AM

## 2020-10-11 NOTE — Procedures (Signed)
Bronchoscopy Procedure Note  Ferdie Bakken  [id number]  1965/09/13  Date:10/11/20  Time:10:50 AM   Provider Performing:Marise Knapper   Procedure(s):  Flexible bronchoscopy with bronchial alveolar lavage (28206)  Indication(s) 55 yo male with aspiration pneumonitis, acute hypoxic respiratory failure and mucus plugging of Rt middle lobe and Rt lower lobe.  Consent Risks of the procedure as well as the alternatives and risks of each were explained to the patient and/or caregiver.  Consent for the procedure was obtained and is signed in the bedside chart  Anesthesia Propofol 20 mcg/kg/min, fentanyl 50 mcg.   Time Out Verified patient identification, verified procedure, site/side was marked, verified correct patient position, special equipment/implants available, medications/allergies/relevant history reviewed, required imaging and test results available.   Sterile Technique Usual hand hygiene, masks, and gloves were used   Procedure Description Patient on ventilator.  FiO2 at 100%.  Advanced bronchoscope through ETT to carina.  Yellow-white secretions noted from both sides.  Bronchoscope entered into left main bronchus.  Thick secretions suctioned.  Lt upper, lingular, lower lobes visualized.  Mucosa normal and no endobronchial lesions.  Bronchoscope entered into right main bronchus.  Thick phlegm occluding airway below bronchus intermedius.  Instilled saline and did brisk suction with clearance of mucus plugs from bronchus intermedius, Rt middle lobe and Rt lower lobe.  Rt upper, middle, and lower lobes visualized.  No endobronchial lesions and mucosa normal.  Wedge bronchoscope into Rt lower lobe.  Instilled 20 ml of saline for BAL.  Suctioned about 10 ml of white fluid with large mucus plugs.  Bronchoscope then withdrawn.  Patient tolerated procedure well.  Hemodynamics and oxygenation stable throughout the procedure.    EBL None   Specimen(s) Quantitative BAL for gram  stain and culture from Rt lower lobe  Chesley Mires, MD [place] Pager - [phone] 10/11/2020, 10:56 AM

## 2020-10-12 ENCOUNTER — Inpatient Hospital Stay (HOSPITAL_COMMUNITY): Payer: 59

## 2020-10-12 DIAGNOSIS — J9601 Acute respiratory failure with hypoxia: Secondary | ICD-10-CM | POA: Diagnosis not present

## 2020-10-12 LAB — CBC
HCT: 30.6 % — ABNORMAL LOW (ref 39.0–52.0)
Hemoglobin: 9.9 g/dL — ABNORMAL LOW (ref 13.0–17.0)
MCH: 27 pg (ref 26.0–34.0)
MCHC: 32.4 g/dL (ref 30.0–36.0)
MCV: 83.4 fL (ref 80.0–100.0)
Platelets: 183 10*3/uL (ref 150–400)
RBC: 3.67 MIL/uL — ABNORMAL LOW (ref 4.22–5.81)
RDW: 16.3 % — ABNORMAL HIGH (ref 11.5–15.5)
WBC: 9.9 10*3/uL (ref 4.0–10.5)
nRBC: 0.2 % (ref 0.0–0.2)

## 2020-10-12 LAB — BASIC METABOLIC PANEL
Anion gap: 14 (ref 5–15)
BUN: 53 mg/dL — ABNORMAL HIGH (ref 6–20)
CO2: 23 mmol/L (ref 22–32)
Calcium: 7.6 mg/dL — ABNORMAL LOW (ref 8.9–10.3)
Chloride: 108 mmol/L (ref 98–111)
Creatinine, Ser: 3.04 mg/dL — ABNORMAL HIGH (ref 0.61–1.24)
GFR, Estimated: 23 mL/min — ABNORMAL LOW (ref >60)
Glucose, Bld: 140 mg/dL — ABNORMAL HIGH (ref 70–99)
Potassium: 4.1 mmol/L (ref 3.5–5.1)
Sodium: 145 mmol/L (ref 135–145)

## 2020-10-12 LAB — GLUCOSE, CAPILLARY
Glucose-Capillary: 121 mg/dL — ABNORMAL HIGH (ref 70–99)
Glucose-Capillary: 146 mg/dL — ABNORMAL HIGH (ref 70–99)
Glucose-Capillary: 139 mg/dL — ABNORMAL HIGH (ref 70–99)
Glucose-Capillary: 143 mg/dL — ABNORMAL HIGH (ref 70–99)
Glucose-Capillary: 139 mg/dL — ABNORMAL HIGH (ref 70–99)
Glucose-Capillary: 147 mg/dL — ABNORMAL HIGH (ref 70–99)

## 2020-10-12 MED ORDER — LEVOFLOXACIN IN D5W 750 MG/150ML IV SOLN
750.0000 mg | INTRAVENOUS | Status: AC
  Administered 2020-10-14: 750 mg via INTRAVENOUS
  Filled 2020-10-12: qty 150

## 2020-10-12 MED ORDER — BETHANECHOL CHLORIDE 5 MG PO TABS
5.0000 mg | ORAL_TABLET | Freq: Three times a day (TID) | ORAL | Status: DC
  Administered 2020-10-12 – 2020-10-15 (×10): 5 mg
  Filled 2020-10-12 (×15): qty 1

## 2020-10-12 NOTE — Progress Notes (Signed)
NAME:  Tommy Perez, MRN:  [id number], DOB:  1965-09-16, LOS: 5 ADMISSION DATE:  10/10/2020, CONSULTATION DATE: 09/24/2020 REFERRING MD: Dr. Lorin Mercy, [facility] CHIEF COMPLAINT: Status post cardiac arrest  Brief History:  55 yo male smoker admitted to [facility] 2/09 with Rt ACA CVA and HTN emergency with acute diastolic CHF.  He was to be d/c to CIR, but opted to go home.  he presented to ER on 2/16 with garbled speech and leg weakness.  Developed bradycardia leading to PEA arrest on 2/16 with ROSC after 21 minutes.  Found to have aspiration pneumonitis.  Past Medical History:  HTN, DM type 2  Significant Hospital Events:  2/09 Admit to [facility] 2/15 d/c home 2/16 admit to [facility], PEA arrest from aspiration pneumonitis  Consults:    Significant Diagnostic Tests:   CT head 2/16 >> atrophy, periventricular small vessel disease, prior infarcts at several sites  Abd u/s 2/17 >> cholelithiasis w/o acute cholecystitis, b/l effusions Rt > Lt, trace ascites, medical renal disease  Bronchoscopy 2/20 >>   Micro Data:  MRSA PCR 2/16 >> negative Sputum 2/17 >>  BAL 2/20 >>  Antimicrobials:  Levaquin 2/17 >>   Subjective/Interim history:  2/21: copious secretions. Awake and following commands. S/p bronch yesterday with some improvement of RLL ? If he will be able to maintain clearance s/p intubation/weakness and recent cva.   Objective   Blood pressure 138/72, pulse 74, temperature 99 F (37.2 C), temperature source Oral, resp. rate (!) 22, weight 72.6 kg, SpO2 99 %.    Vent Mode: CPAP;PSV FiO2 (%):  [30 %] 30 % Set Rate:  [18 bmp] 18 bmp Vt Set:  [580 mL] 580 mL PEEP:  [5 cmH20] 5 cmH20 Pressure Support:  [15 cmH20] 15 cmH20 Plateau Pressure:  [14 cmH20-18 cmH20] 14 cmH20   Intake/Output Summary (Last 24 hours) at 10/12/2020 1727 Last data filed at 10/12/2020 1700 Gross per 24 hour  Intake 2134.7 ml  Output 1765 ml  Net 369.7 ml   Filed Weights   10/10/20 0402 10/11/20 0402 10/12/20 0359   Weight: 75.2 kg 72.4 kg 72.6 kg    Examination:  General - on vent, awake and following commands Eyes - pupils reactive, non icteric ENT - no sinus tenderness, no stridor, mmmp Cardiac - regular rate/rhythm, no murmur Chest - scattered rhonchi, decreased BS Rt base Abdomen - soft, mildly tender, + bowel sounds Extremities - no cyanosis, clubbing, or edema, poor hygiene of feet Skin - scaling b/l le Neuro - RASS 0  Resolved problems:  PEA cardiac arrest  Assessment & Plan:   Acute hypoxic respiratory failure from aspiration pneumonitis, acute pulmonary edema, b/l transudate pleural effusions. Mucus plugging of RML and RLL. Tobacco abuse. - pressure support as able - arrange for bedside bronchoscopy on 2/20; d/w pt's cousin >> risks/benefits reviewed - cxr with improved aeration of RLL, personally reviewed by me - day 5 of levaquin; has PCN allergy - nicotine patch -concern about ability to clear secretions once extubated, will certainly do sbt as on minimal vent settings.  -will need to d/w family what plan would be if fails extubation considering his recent events  Acute on chronic diastolic CHF. Hx of HTN, HLD. - goal SBP < 160 - continue norvasc, lipitor, coreg, hydralazine  Acute metabolic encephalopathy after cardiac arrest with recent CVA. - RASS goal 0 - continue ASA, plavix  CKD 3b. - f/u BMET - monitor urine outpt  Elevated LFTs 2nd to hypoxia. - abdominal u/s unremarkable  for acute process - f/u LFTs intermittently  Severe protein calorie malnutrition. - tube feeds  DM type 2 poorly controlled with hyperglycemia. -SSI - hold outpt jardiance, metformin  Social determinants of health. - pt will need to be able to be extubated to eval competent ability, he is able to follow commands but cannot assign hcpoa at this time.   Best practice (evaluated daily)  Diet: tube feeds DVT prophylaxis: Lovenox GI prophylaxis: Protonix Mobility:  Bedrest Disposition: ICU   Goals of Care:  Code Status: Full code  Labs    CMP Latest Ref Rng & Units 10/12/2020 10/11/2020 10/10/2020  Glucose 70 - 99 mg/dL 140(H) 153(H) 168(H)  BUN 6 - 20 mg/dL 53(H) 51(H) 48(H)  Creatinine 0.61 - 1.24 mg/dL 3.04(H) 2.97(H) 2.87(H)  Sodium 135 - 145 mmol/L 145 144 140  Potassium 3.5 - 5.1 mmol/L 4.1 4.1 4.0  Chloride 98 - 111 mmol/L 108 107 106  CO2 22 - 32 mmol/L _0 Calcium 8.9 - 10.3 mg/dL 7.6(L) 7.6(L) 7.2(L)  Total Protein 6.5 - 8.1 g/dL - 5.2(L) -  Total Bilirubin 0.3 - 1.2 mg/dL - 0.6 -  Alkaline Phos 38 - 126 U/L - 288(H) -  AST 15 - 41 U/L - 71(H) -  ALT 0 - 44 U/L - 80(H) -    CBC Latest Ref Rng & Units 10/12/2020 10/11/2020 10/10/2020  WBC 4.0 - 10.5 K/uL 9.9 13.9(H) 15.1(H)  Hemoglobin 13.0 - 17.0 g/dL 9.9(L) 9.8(L) 10.5(L)  Hematocrit 39.0 - 52.0 % 30.6(L) 29.4(L) 31.5(L)  Platelets 150 - 400 K/uL 183 164 123(L)    ABG    Component Value Date/Time   PHART 7.326 (L) 10/08/2020 0010   PCO2ART 42.4 10/08/2020 0010   PO2ART 295 (H) 10/08/2020 0010   HCO3 24.0 10/08/2020 0010   TCO2 26 10/08/2020 0010   ACIDBASEDEF 4.0 (H) 10/08/2020 0010   O2SAT 100.0 10/08/2020 0010    CBG (last 3)  Recent Labs    10/12/20 0802 10/12/20 1214 10/12/20 1521  GLUCAP 146* 139* 143*    Critical care time: The patient is critically ill with multiple organ systems failure and requires high complexity decision making for assessment and support, frequent evaluation and titration of therapies, application of advanced monitoring technologies and extensive interpretation of multiple databases.  Critical care time 33 mins. This represents my time independent of the NPs time taking care of the pt. This is excluding procedures.    Audria Nine DO [facility] 10/12/2020, 5:27 PM See Amion for pager If no response to pager, please call [phone] until 1900 After 1900 please call [facility] [phone]

## 2020-10-12 NOTE — Progress Notes (Signed)
Assisted tele visit to patient with step-son.  Vena Austria, RN

## 2020-10-13 ENCOUNTER — Inpatient Hospital Stay (HOSPITAL_COMMUNITY): Payer: 59

## 2020-10-13 DIAGNOSIS — J9601 Acute respiratory failure with hypoxia: Secondary | ICD-10-CM | POA: Diagnosis not present

## 2020-10-13 DIAGNOSIS — I469 Cardiac arrest, cause unspecified: Secondary | ICD-10-CM

## 2020-10-13 LAB — COMPREHENSIVE METABOLIC PANEL
ALT: 69 U/L — ABNORMAL HIGH (ref 0–44)
AST: 73 U/L — ABNORMAL HIGH (ref 15–41)
Albumin: 1.3 g/dL — ABNORMAL LOW (ref 3.5–5.0)
Alkaline Phosphatase: 402 U/L — ABNORMAL HIGH (ref 38–126)
Anion gap: 11 (ref 5–15)
BUN: 53 mg/dL — ABNORMAL HIGH (ref 6–20)
CO2: 23 mmol/L (ref 22–32)
Calcium: 7.9 mg/dL — ABNORMAL LOW (ref 8.9–10.3)
Chloride: 112 mmol/L — ABNORMAL HIGH (ref 98–111)
Creatinine, Ser: 3 mg/dL — ABNORMAL HIGH (ref 0.61–1.24)
GFR, Estimated: 24 mL/min — ABNORMAL LOW (ref >60)
Glucose, Bld: 151 mg/dL — ABNORMAL HIGH (ref 70–99)
Potassium: 4.1 mmol/L (ref 3.5–5.1)
Sodium: 146 mmol/L — ABNORMAL HIGH (ref 135–145)
Total Bilirubin: 0.8 mg/dL (ref 0.3–1.2)
Total Protein: 5.6 g/dL — ABNORMAL LOW (ref 6.5–8.1)

## 2020-10-13 LAB — CBC
HCT: 30.8 % — ABNORMAL LOW (ref 39.0–52.0)
Hemoglobin: 10.1 g/dL — ABNORMAL LOW (ref 13.0–17.0)
MCH: 27.3 pg (ref 26.0–34.0)
MCHC: 32.8 g/dL (ref 30.0–36.0)
MCV: 83.2 fL (ref 80.0–100.0)
Platelets: 224 10*3/uL (ref 150–400)
RBC: 3.7 MIL/uL — ABNORMAL LOW (ref 4.22–5.81)
RDW: 16.5 % — ABNORMAL HIGH (ref 11.5–15.5)
WBC: 9 10*3/uL (ref 4.0–10.5)
nRBC: 0 % (ref 0.0–0.2)

## 2020-10-13 LAB — MAGNESIUM: Magnesium: 1.9 mg/dL (ref 1.7–2.4)

## 2020-10-13 LAB — GLUCOSE, CAPILLARY
Glucose-Capillary: 128 mg/dL — ABNORMAL HIGH (ref 70–99)
Glucose-Capillary: 130 mg/dL — ABNORMAL HIGH (ref 70–99)
Glucose-Capillary: 140 mg/dL — ABNORMAL HIGH (ref 70–99)
Glucose-Capillary: 143 mg/dL — ABNORMAL HIGH (ref 70–99)
Glucose-Capillary: 152 mg/dL — ABNORMAL HIGH (ref 70–99)
Glucose-Capillary: 166 mg/dL — ABNORMAL HIGH (ref 70–99)

## 2020-10-13 LAB — VITAMIN B1: Vitamin B1 (Thiamine): 88.7 nmol/L (ref 66.5–200.0)

## 2020-10-13 MED ORDER — SODIUM CHLORIDE 0.9 % IV SOLN
200.0000 mg | Freq: Once | INTRAVENOUS | Status: AC
  Administered 2020-10-13: 200 mg via INTRAVENOUS
  Filled 2020-10-13: qty 200

## 2020-10-13 MED ORDER — SODIUM CHLORIDE 0.9 % IV SOLN
100.0000 mg | INTRAVENOUS | Status: AC
  Administered 2020-10-14 – 2020-10-15 (×2): 100 mg via INTRAVENOUS
  Filled 2020-10-13 (×2): qty 100

## 2020-10-13 NOTE — Progress Notes (Signed)
eLink Physician-Brief Progress Note Patient Name: Kavish Lafitte DOB: Mar 09, 1966 MRN: [id number]   Date of Service  10/13/2020  HPI/Events of Note  RN noted crackles on exam diffusely.  This is expected from findings on prior CXR.  eICU Interventions  Repeat CXR in the morning.     Intervention Category Minor Interventions: Other:  Larinda Buttery 10/13/2020, 9:50 PM

## 2020-10-13 NOTE — Consult Note (Addendum)
WOC Nurse Consult Note: Reason for Consult: Consult requested for sacrum/buttocks.  Pt had a cardiac arrest last week and is critically ill with multiple systemic factors that can impair healing. Pt is frequently incontinent of liquid stool and a Flexiseal has been inserted to attempt to contain the stool, but he is still leaking around the device and it is difficult to keep the wound from becoming soiled.  Wound type: Sacrum/buttocks with dark reddish purple deep tissue pressure injury; location is 6.5X5cm; skin is loose and peeling to the edges and beginning to evolve into full thickness tissue loss.   There are 2 wounds in the affected area which are separated by a narrow bridge of skin.   Pressure Injury POA: No Dressing procedure/placement/frequency: Topical treatment orders provided for bedside nurses to perform as follows: Pt is on a low air loss mattress to reduce pressure. Foam dressing to sacrum/buttocks; change Q 3 days or PRN soiling.  WOC team will continue to assess weekly to determine if a change in a plan of care is necessary at that time.  Cammie Mcgee MSN, RN, CWOCN, [place], CNS [phone]

## 2020-10-13 NOTE — Progress Notes (Signed)
eLink Physician-Brief Progress Note Patient Name: Tommy Perez DOB: May 11, 1966 MRN: [id number]   Date of Service  10/13/2020  HPI/Events of Note  Liquid stools with pressure ulcers on buttocks. Request for Flexiseal.   eICU Interventions  Plan: 1. Place Flexiseal.      Intervention Category Major Interventions: Other:  Lenell Antu 10/13/2020, 2:32 AM

## 2020-10-13 NOTE — Progress Notes (Addendum)
NAME:  Tommy Perez, MRN:  [id number], DOB:  1965/11/19, LOS: 6 ADMISSION DATE:  10/06/2020, CONSULTATION DATE: 09/30/2020 REFERRING MD: Dr. Lorin Mercy, [facility] CHIEF COMPLAINT: Status post cardiac arrest  Brief History:  55 yo male smoker admitted to [facility] 2/09 with Rt ACA CVA and HTN emergency with acute diastolic CHF.  He was to be d/c to CIR, but opted to go home.  he presented to ER on 2/16 with garbled speech and leg weakness.  Developed bradycardia leading to PEA arrest on 2/16 with ROSC after 21 minutes.  Found to have aspiration pneumonitis.  Past Medical History:  HTN, DM type 2  Significant Hospital Events:  2/09 Admit to [facility] 2/15 d/c home 2/16 admit to [facility], PEA arrest from aspiration pneumonitis  Consults:    Significant Diagnostic Tests:   CT head 2/16 >> atrophy, periventricular small vessel disease, prior infarcts at several sites  Abd u/s 2/17 >> cholelithiasis w/o acute cholecystitis, b/l effusions Rt > Lt, trace ascites, medical renal disease  Bronchoscopy 2/20 >>   Micro Data:  MRSA PCR 2/16 >> negative Sputum 2/17 >> candida BAL 2/20 >> 60K yeast  Antimicrobials:  Levaquin 2/17 >> 2/23 eraxis 2/22-> Subjective/Interim history:  2/22: pt did not tolerate sbt this am. Awake and following commands, visiting with family. Secretions still persist, perhaps a bit improved. Renal function stable. Updated family and him that we need a clear plan once able to be extubated  If he deteriorates 1. reintubate and trach or 2. Transition to comfort 2/21: copious secretions. Awake and following commands. S/p bronch yesterday with some improvement of RLL ? If he will be able to maintain clearance s/p intubation/weakness and recent cva.   Objective   Blood pressure (!) 143/80, pulse 67, temperature 97.6 F (36.4 C), temperature source Oral, resp. rate 18, weight 72.6 kg, SpO2 100 %.    Vent Mode: PRVC FiO2 (%):  [30 %] 30 % Set Rate:  [18 bmp] 18 bmp Vt Set:  [580 mL-600 mL]  600 mL PEEP:  [5 cmH20] 5 cmH20 Pressure Support:  [15 cmH20] 15 cmH20 Plateau Pressure:  [14 cmH20-21 cmH20] 21 cmH20   Intake/Output Summary (Last 24 hours) at 10/13/2020 0948 Last data filed at 10/13/2020 0600 Gross per 24 hour  Intake 1909.81 ml  Output 1925 ml  Net -15.19 ml   Filed Weights   10/10/20 0402 10/11/20 0402 10/12/20 0359  Weight: 75.2 kg 72.4 kg 72.6 kg    Examination:  General - on vent, awake and following commands Eyes - pupils reactive, non icteric ENT - no sinus tenderness, no stridor, mmmp Cardiac - regular rate/rhythm, no murmur Chest - scattered rhonchi, decreased BS Rt base Abdomen - soft, mildly tender, + bowel sounds Extremities - no cyanosis, clubbing, or edema, poor hygiene of feet Skin - scaling b/l le Neuro - RASS 0  Resolved problems:  PEA cardiac arrest  Assessment & Plan:   Acute hypoxic respiratory failure from aspiration pneumonitis, acute pulmonary edema, b/l transudate pleural effusions. Mucus plugging of RML and RLL. Tobacco abuse. - pressure support as able - s/p bronch on 2/20 with 60K yeast so will start antifungal for now. Esp with inability to wean and cont secretions - cxr with increasing density again of RLL, personally reviewed by me - to complete 7 days of levaquin tomorrow; has PCN allergy - nicotine patch -concern about ability to clear secretions once extubated, will certainly do sbt as on minimal vent settings.  -will need to d/w family  what plan would be if fails extubation considering his recent events. Family and pt considering options  Acute on chronic diastolic CHF. Hx of HTN, HLD. - goal SBP < 160 - continue norvasc, lipitor, coreg, hydralazine  Acute metabolic encephalopathy after cardiac arrest with recent CVA. - RASS goal 0 - continue ASA, plavix  CKD 3b. - f/u BMET - monitor urine outpt  Elevated LFTs 2nd to hypoxia. - abdominal u/s unremarkable for acute process - f/u LFTs  intermittently  Severe protein calorie malnutrition. - tube feeds  DM type 2 poorly controlled with hyperglycemia. -SSI - hold outpt jardiance, metformin  Social determinants of health. - pt will need to be able to be extubated to eval competent ability, he is able to follow commands but cannot assign hcpoa at this time.   Best practice (evaluated daily)  Diet: tube feeds DVT prophylaxis: Lovenox GI prophylaxis: Protonix Mobility: Bedrest Disposition: ICU   Goals of Care:  Code Status: Full code  Labs    CMP Latest Ref Rng & Units 10/13/2020 10/12/2020 10/11/2020  Glucose 70 - 99 mg/dL 151(H) 140(H) 153(H)  BUN 6 - 20 mg/dL 53(H) 53(H) 51(H)  Creatinine 0.61 - 1.24 mg/dL 3.00(H) 3.04(H) 2.97(H)  Sodium 135 - 145 mmol/L 146(H) 145 144  Potassium 3.5 - 5.1 mmol/L 4.1 4.1 4.1  Chloride 98 - 111 mmol/L 112(H) 108 107  CO2 22 - 32 mmol/L _0 Calcium 8.9 - 10.3 mg/dL 7.9(L) 7.6(L) 7.6(L)  Total Protein 6.5 - 8.1 g/dL 5.6(L) - 5.2(L)  Total Bilirubin 0.3 - 1.2 mg/dL 0.8 - 0.6  Alkaline Phos 38 - 126 U/L 402(H) - 288(H)  AST 15 - 41 U/L 73(H) - 71(H)  ALT 0 - 44 U/L 69(H) - 80(H)    CBC Latest Ref Rng & Units 10/13/2020 10/12/2020 10/11/2020  WBC 4.0 - 10.5 K/uL 9.0 9.9 13.9(H)  Hemoglobin 13.0 - 17.0 g/dL 10.1(L) 9.9(L) 9.8(L)  Hematocrit 39.0 - 52.0 % 30.8(L) 30.6(L) 29.4(L)  Platelets 150 - 400 K/uL 224 183 164    ABG    Component Value Date/Time   PHART 7.326 (L) 10/08/2020 0010   PCO2ART 42.4 10/08/2020 0010   PO2ART 295 (H) 10/08/2020 0010   HCO3 24.0 10/08/2020 0010   TCO2 26 10/08/2020 0010   ACIDBASEDEF 4.0 (H) 10/08/2020 0010   O2SAT 100.0 10/08/2020 0010    CBG (last 3)  Recent Labs    10/12/20 2320 10/13/20 0323 10/13/20 0738  GLUCAP 147* 143* 140*    Critical care time: The patient is critically ill with multiple organ systems failure and requires high complexity decision making for assessment and support, frequent evaluation and titration of  therapies, application of advanced monitoring technologies and extensive interpretation of multiple databases.  Critical care time 38 mins. This represents my time independent of the NPs time taking care of the pt. This is excluding procedures.    Audria Nine DO  Pulmonary and Critical Care 10/13/2020, 9:48 AM See Amion for pager If no response to pager, please call [phone] until 1900 After 1900 please call [facility] [phone]

## 2020-10-14 ENCOUNTER — Inpatient Hospital Stay (HOSPITAL_COMMUNITY): Payer: 59

## 2020-10-14 DIAGNOSIS — I469 Cardiac arrest, cause unspecified: Secondary | ICD-10-CM | POA: Diagnosis not present

## 2020-10-14 LAB — CBC
HCT: 30.7 % — ABNORMAL LOW (ref 39.0–52.0)
Hemoglobin: 9.8 g/dL — ABNORMAL LOW (ref 13.0–17.0)
MCH: 26.8 pg (ref 26.0–34.0)
MCHC: 31.9 g/dL (ref 30.0–36.0)
MCV: 84.1 fL (ref 80.0–100.0)
Platelets: 259 10*3/uL (ref 150–400)
RBC: 3.65 MIL/uL — ABNORMAL LOW (ref 4.22–5.81)
RDW: 16.6 % — ABNORMAL HIGH (ref 11.5–15.5)
WBC: 7.4 10*3/uL (ref 4.0–10.5)
nRBC: 0 % (ref 0.0–0.2)

## 2020-10-14 LAB — COMPREHENSIVE METABOLIC PANEL
ALT: 77 U/L — ABNORMAL HIGH (ref 0–44)
AST: 95 U/L — ABNORMAL HIGH (ref 15–41)
Albumin: 1.3 g/dL — ABNORMAL LOW (ref 3.5–5.0)
Alkaline Phosphatase: 435 U/L — ABNORMAL HIGH (ref 38–126)
Anion gap: 11 (ref 5–15)
BUN: 54 mg/dL — ABNORMAL HIGH (ref 6–20)
CO2: 24 mmol/L (ref 22–32)
Calcium: 8 mg/dL — ABNORMAL LOW (ref 8.9–10.3)
Chloride: 113 mmol/L — ABNORMAL HIGH (ref 98–111)
Creatinine, Ser: 2.91 mg/dL — ABNORMAL HIGH (ref 0.61–1.24)
GFR, Estimated: 25 mL/min — ABNORMAL LOW (ref >60)
Glucose, Bld: 146 mg/dL — ABNORMAL HIGH (ref 70–99)
Potassium: 4.4 mmol/L (ref 3.5–5.1)
Sodium: 148 mmol/L — ABNORMAL HIGH (ref 135–145)
Total Bilirubin: 0.8 mg/dL (ref 0.3–1.2)
Total Protein: 5.7 g/dL — ABNORMAL LOW (ref 6.5–8.1)

## 2020-10-14 LAB — CULTURE, BAL-QUANTITATIVE W GRAM STAIN
Culture: 60000 — AB
Special Requests: NORMAL

## 2020-10-14 LAB — GLUCOSE, CAPILLARY
Glucose-Capillary: 100 mg/dL — ABNORMAL HIGH (ref 70–99)
Glucose-Capillary: 115 mg/dL — ABNORMAL HIGH (ref 70–99)
Glucose-Capillary: 127 mg/dL — ABNORMAL HIGH (ref 70–99)
Glucose-Capillary: 128 mg/dL — ABNORMAL HIGH (ref 70–99)
Glucose-Capillary: 133 mg/dL — ABNORMAL HIGH (ref 70–99)
Glucose-Capillary: 155 mg/dL — ABNORMAL HIGH (ref 70–99)

## 2020-10-14 LAB — TRIGLYCERIDES: Triglycerides: 65 mg/dL (ref <150)

## 2020-10-14 LAB — MAGNESIUM: Magnesium: 2 mg/dL (ref 1.7–2.4)

## 2020-10-14 MED ORDER — FUROSEMIDE 10 MG/ML IJ SOLN
40.0000 mg | Freq: Once | INTRAMUSCULAR | Status: AC
  Administered 2020-10-14: 40 mg via INTRAVENOUS
  Filled 2020-10-14: qty 4

## 2020-10-14 MED FILL — Medication: Qty: 1 | Status: AC

## 2020-10-14 NOTE — Progress Notes (Signed)
Met with family and close friend/minister.    They feel pt would want ttrach should he deteriorate after extubation. So we will plan for extubation when optimized and should he deteriorate and req re-intubation move to trach.   Consulted TOC to answer some questions family had.

## 2020-10-14 NOTE — TOC Initial Note (Signed)
Transition of Care [facility]) - Initial/Assessment Note    Patient Details  Name: Tommy Perez MRN: [id number] Date of Birth: 13-Sep-1965  Transition [facility] CM/SW Contact:    Bess Kinds, RN Phone Number: [phone] 10/14/2020, 1:40 PM  Clinical Narrative:                  Acknowledging TOC consult to reach out to family concerning questions about discharge planning. Spoke with patient's cousin, [name], at [phone]. Discussed process of determining discharge needs dependent on how well he does weaning from ventilator and his work with therapy. Discussed that discharge can vary from SNF to [facility] to LTAC. Noted previous recommendations for CIR. [name] verbalized understanding. Noted no SSN on file and advised that will be needed if he goes to a facility for rehab.   Verified address in Epic is where patient had previously been living. However, [name] stated that he will not be returning to that address stating it is not in his best interest to return to that residence.   Patient had previously been independent. Worked as a Copy.   [name] unsure if he had insurance. Discussed that he was showing as having [employer] since 02/2020.   [name] not aware of patient having a PCP. Discussed clinic options in [facility].   [name] asked about transportation. Discussed [employer] bus system and [employer] [employer] for disability transport vs private transport companies. Advised of [place] transportation for transportation to [facility] medical facilities.   Reached out to local LTACs[facility], to inquire about LTAC benefits through [employer]. LTACs following along for potential assistance with trach and vent weaning if needed.   [name] expressed appreciation for CM call.   TOC following for transition needs.    Barriers to Discharge: Continued Medical Work up   Patient Goals and CMS Choice        Expected Discharge Plan and Services     Discharge Planning  Services: CM Consult   Living arrangements for the past 2 months: Single Family Home                                      Prior Living Arrangements/Services Living arrangements for the past 2 months: Single Family Home Lives with:: Self,Roommate                   Activities of Daily Living      Permission Sought/Granted                  Emotional Assessment              Admission diagnosis:  Late effect of stroke [I69.30] Ambulatory dysfunction [R26.2] Patient Active Problem List   Diagnosis Date Noted  . Ambulatory dysfunction 10/17/2020  . Dyslipidemia 10/02/2020  . Hypertension   . Dysphagia following cerebrovascular accident (CVA)   . Diabetes mellitus, type 2 (HCC)   . Nicotine dependence   . Acute ischemic right anterior cerebral artery (ACA) stroke (HCC) 09/29/2020  . AKI (acute kidney injury) (HCC) 09/29/2020  . Type 2 diabetes mellitus with stage 3a chronic kidney disease, without long-term current use of insulin (HCC) 09/29/2020  . Hypertensive urgency 09/29/2020  . Pleural effusion, bilateral 09/29/2020  . Acute congestive heart failure (HCC) 09/29/2020  . Elevated troponin level not due myocardial infarction 09/29/2020  . Nicotine dependence, cigarettes, uncomplicated 09/29/2020  . Complicated  UTI (urinary tract infection) 09/29/2020  . Essential hypertension 09/09/2019  . Type 2 diabetes mellitus with hyperglycemia (HCC) 09/09/2019   PCP:  Patient, No Pcp Per Pharmacy:   [facility] [address]), [place] - [address] [place]) [place] [address] Phone: [phone] Fax: [phone]     Social Determinants of Health (SDOH) Interventions    Readmission Risk Interventions No flowsheet data found.

## 2020-10-14 NOTE — Progress Notes (Addendum)
NAME:  Tommy Perez, MRN:  [id number], DOB:  1965/10/22, LOS: 7 ADMISSION DATE:  10/11/2020, CONSULTATION DATE: 10/13/2020 REFERRING MD: Dr. Lorin Mercy, [facility] CHIEF COMPLAINT: Status post cardiac arrest  Brief History:  55 yo male smoker admitted to [facility] 2/09 with Rt ACA CVA and HTN emergency with acute diastolic CHF.  He was to be d/c to CIR, but opted to go home.  he presented to ER on 2/16 with garbled speech and leg weakness.  Developed bradycardia leading to PEA arrest on 2/16 with ROSC after 21 minutes.  Found to have aspiration pneumonitis.  Past Medical History:  HTN, DM type 2  Significant Hospital Events:  2/09 Admit to [facility] 2/15 d/c home 2/16 admit to [facility], PEA arrest from aspiration pneumonitis  Consults:    Significant Diagnostic Tests:   CT head 2/16 >> atrophy, periventricular small vessel disease, prior infarcts at several sites  Abd u/s 2/17 >> cholelithiasis w/o acute cholecystitis, b/l effusions Rt > Lt, trace ascites, medical renal disease  Bronchoscopy 2/20 >>   Micro Data:  MRSA PCR 2/16 >> negative Sputum 2/17 >> candida BAL 2/20 >> 60K yeast  Antimicrobials:  Levaquin 2/17 >> 2/23 eraxis 2/22-> Subjective/Interim history:  2/23: tolerating sbt but with increased ps up to 14. Will cont to try to decrease this. Yesterday had issue with hypoxemia that req increased peep and fio2 but improved today. Possible that we will be able to wean and then extubate in next 24 hours hopefully once family has clear plan delineated   2/22: pt did not tolerate sbt this am. Awake and following commands, visiting with family. Secretions still persist, perhaps a bit improved. Renal function stable. Updated family and him that we need a clear plan once able to be extubated  If he deteriorates 1. reintubate and trach or 2. Transition to comfort 2/21: copious secretions. Awake and following commands. S/p bronch yesterday with some improvement of RLL ? If he will be able to maintain  clearance s/p intubation/weakness and recent cva.   Objective   Blood pressure 137/75, pulse 75, temperature 97.7 F (36.5 C), temperature source Axillary, resp. rate (!) 26, weight 69.2 kg, SpO2 100 %.    Vent Mode: PSV;CPAP FiO2 (%):  [40 %-100 %] 40 % Set Rate:  [18 bmp] 18 bmp Vt Set:  [600 mL] 600 mL PEEP:  [5 cmH20-8 cmH20] 8 cmH20 Pressure Support:  [14 cmH20] 14 cmH20 Plateau Pressure:  [23 cmH20-26 cmH20] 23 cmH20   Intake/Output Summary (Last 24 hours) at 10/14/2020 1107 Last data filed at 10/14/2020 0800 Gross per 24 hour  Intake 1880.38 ml  Output 1860 ml  Net 20.38 ml   Filed Weights   10/11/20 0402 10/12/20 0359 10/14/20 0500  Weight: 72.4 kg 72.6 kg 69.2 kg    Examination:  General - on vent, awake and following commands Eyes - pupils reactive, non icteric ENT - no sinus tenderness, no stridor, mmmp Cardiac - regular rate/rhythm, no murmur Chest -rales Abdomen - soft, mildly tender, + bowel sounds Extremities - no cyanosis, clubbing, or edema, poor hygiene of feet Skin - scaling b/l le Neuro - RASS 0  Resolved problems:  PEA cardiac arrest  Assessment & Plan:   Acute hypoxic respiratory failure from aspiration pneumonitis, acute pulmonary edema, b/l transudate pleural effusions. Mucus plugging of RML and RLL. Tobacco abuse. - pressure support as able on 14 ps today with tolerating sbt - s/p bronch on 2/20 with 60K yeast so cont start antifungal for now.  Esp with inability to wean and cont secretions - cxr with increasing density again of RLL, personally reviewed by me - to complete 7 days of levaquin today; has PCN allergy - nicotine patch -concern about ability to clear secretions once extubated, will certainly do sbt as on minimal vent settings.  -will need to d/w family what plan would be if fails extubation considering his recent events. Family and pt considering options -lasix x1  Acute on chronic diastolic CHF. Hx of HTN, HLD. - goal SBP <  160 - continue norvasc, lipitor, coreg, hydralazine  Acute metabolic encephalopathy after cardiac arrest with recent CVA. - RASS goal 0 - continue ASA, plavix  CKD 3b. - f/u BMET - monitor urine outpt  Elevated LFTs 2nd to hypoxia. - abdominal u/s unremarkable for acute process - f/u LFTs intermittently  Severe protein calorie malnutrition. - tube feeds  DM type 2 poorly controlled with hyperglycemia. -SSI - hold outpt jardiance, metformin  Social determinants of health. - pt will need to be able to be extubated to eval competent ability, he is able to follow commands but cannot assign hcpoa at this time.   Best practice (evaluated daily)  Diet: tube feeds DVT prophylaxis: Lovenox GI prophylaxis: Protonix Mobility: Bedrest Disposition: ICU   Goals of Care:  Code Status: Full code  Labs    CMP Latest Ref Rng & Units 10/14/2020 10/13/2020 10/12/2020  Glucose 70 - 99 mg/dL 146(H) 151(H) 140(H)  BUN 6 - 20 mg/dL 54(H) 53(H) 53(H)  Creatinine 0.61 - 1.24 mg/dL 2.91(H) 3.00(H) 3.04(H)  Sodium 135 - 145 mmol/L 148(H) 146(H) 145  Potassium 3.5 - 5.1 mmol/L 4.4 4.1 4.1  Chloride 98 - 111 mmol/L 113(H) 112(H) 108  CO2 22 - 32 mmol/L _0 Calcium 8.9 - 10.3 mg/dL 8.0(L) 7.9(L) 7.6(L)  Total Protein 6.5 - 8.1 g/dL 5.7(L) 5.6(L) -  Total Bilirubin 0.3 - 1.2 mg/dL 0.8 0.8 -  Alkaline Phos 38 - 126 U/L 435(H) 402(H) -  AST 15 - 41 U/L 95(H) 73(H) -  ALT 0 - 44 U/L 77(H) 69(H) -    CBC Latest Ref Rng & Units 10/14/2020 10/13/2020 10/12/2020  WBC 4.0 - 10.5 K/uL 7.4 9.0 9.9  Hemoglobin 13.0 - 17.0 g/dL 9.8(L) 10.1(L) 9.9(L)  Hematocrit 39.0 - 52.0 % 30.7(L) 30.8(L) 30.6(L)  Platelets 150 - 400 K/uL 259 224 183    ABG    Component Value Date/Time   PHART 7.326 (L) 10/08/2020 0010   PCO2ART 42.4 10/08/2020 0010   PO2ART 295 (H) 10/08/2020 0010   HCO3 24.0 10/08/2020 0010   TCO2 26 10/08/2020 0010   ACIDBASEDEF 4.0 (H) 10/08/2020 0010   O2SAT 100.0 10/08/2020 0010     CBG (last 3)  Recent Labs    10/13/20 2339 10/14/20 0328 10/14/20 0739  GLUCAP 166* 115* 133*    Critical care time: The patient is critically ill with multiple organ systems failure and requires high complexity decision making for assessment and support, frequent evaluation and titration of therapies, application of advanced monitoring technologies and extensive interpretation of multiple databases.  Critical care time 43 mins. This represents my time independent of the NPs time taking care of the pt. This is excluding procedures.    Audria Nine DO [place] [facility] 10/14/2020, 11:07 AM See Amion for pager If no response to pager, please call [phone] until 1900 After 1900 please call [facility] [phone]

## 2020-10-14 NOTE — Progress Notes (Signed)
RT NOTES: Patient pulled ETT out to 18cm, advanced tube back to 22 cm at the lip without difficulty. Pt getting adequate volumes. RN at bedside.

## 2020-10-14 NOTE — Progress Notes (Signed)
Seen sedated on bed with ongoing Propofol infusion, pt responded thru facial gesture, not in distress with with ETT to MV with Fi02=40%, with ongoing OGT feeding, with foley catheter to bag, on bld sugar monitoring

## 2020-10-14 NOTE — Progress Notes (Signed)
RT NOTES: Pt weaning 10/5 pressure support. Placed back on full support at this time d/t RN sedating patient.

## 2020-10-14 NOTE — TOC Progression Note (Signed)
Transition of Care [facility]) - Progression Note    Patient Details  Name: Torrian Canion MRN: [id number] Date of Birth: 1966-07-20  Transition of Care [facility]) CM/SW Contact  Bess Kinds, RN Phone Number: [phone] 10/14/2020, 2:40 PM  Clinical Narrative:     Received call back from [facility]. Able to offer bed pending insurance authoriztion if MD agreeable to transition. Insurance can take a couple days. Patient does not need to have a trach prior to transition.   Discussed offer with MD over secure chat. Patient may not need trach, however, agreeable to transition if family desires.   Spoke with [name] to discuss information for consideration. Contact information for [facility] provided to [name]. [name] to consider options.   TOC following for transition needs.     Barriers to Discharge: Continued Medical Work up  Expected Discharge Plan and Services     Discharge Planning Services: CM Consult   Living arrangements for the past 2 months: Single Family Home                                       Social Determinants of Health (SDOH) Interventions    Readmission Risk Interventions No flowsheet data found.

## 2020-10-15 ENCOUNTER — Inpatient Hospital Stay (HOSPITAL_COMMUNITY): Payer: 59

## 2020-10-15 DIAGNOSIS — J9601 Acute respiratory failure with hypoxia: Secondary | ICD-10-CM | POA: Diagnosis not present

## 2020-10-15 LAB — CBC
HCT: 30.6 % — ABNORMAL LOW (ref 39.0–52.0)
Hemoglobin: 9.7 g/dL — ABNORMAL LOW (ref 13.0–17.0)
MCH: 26.5 pg (ref 26.0–34.0)
MCHC: 31.7 g/dL (ref 30.0–36.0)
MCV: 83.6 fL (ref 80.0–100.0)
Platelets: 282 10*3/uL (ref 150–400)
RBC: 3.66 MIL/uL — ABNORMAL LOW (ref 4.22–5.81)
RDW: 16.2 % — ABNORMAL HIGH (ref 11.5–15.5)
WBC: 10.6 10*3/uL — ABNORMAL HIGH (ref 4.0–10.5)
nRBC: 0 % (ref 0.0–0.2)

## 2020-10-15 LAB — COMPREHENSIVE METABOLIC PANEL
ALT: 80 U/L — ABNORMAL HIGH (ref 0–44)
AST: 103 U/L — ABNORMAL HIGH (ref 15–41)
Albumin: 1.2 g/dL — ABNORMAL LOW (ref 3.5–5.0)
Alkaline Phosphatase: 444 U/L — ABNORMAL HIGH (ref 38–126)
Anion gap: 12 (ref 5–15)
BUN: 58 mg/dL — ABNORMAL HIGH (ref 6–20)
CO2: 24 mmol/L (ref 22–32)
Calcium: 8.1 mg/dL — ABNORMAL LOW (ref 8.9–10.3)
Chloride: 111 mmol/L (ref 98–111)
Creatinine, Ser: 2.98 mg/dL — ABNORMAL HIGH (ref 0.61–1.24)
GFR, Estimated: 24 mL/min — ABNORMAL LOW (ref >60)
Glucose, Bld: 154 mg/dL — ABNORMAL HIGH (ref 70–99)
Potassium: 4.2 mmol/L (ref 3.5–5.1)
Sodium: 147 mmol/L — ABNORMAL HIGH (ref 135–145)
Total Bilirubin: 0.9 mg/dL (ref 0.3–1.2)
Total Protein: 6 g/dL — ABNORMAL LOW (ref 6.5–8.1)

## 2020-10-15 LAB — MAGNESIUM: Magnesium: 1.9 mg/dL (ref 1.7–2.4)

## 2020-10-15 LAB — GLUCOSE, CAPILLARY
Glucose-Capillary: 161 mg/dL — ABNORMAL HIGH (ref 70–99)
Glucose-Capillary: 142 mg/dL — ABNORMAL HIGH (ref 70–99)
Glucose-Capillary: 169 mg/dL — ABNORMAL HIGH (ref 70–99)
Glucose-Capillary: 151 mg/dL — ABNORMAL HIGH (ref 70–99)
Glucose-Capillary: 81 mg/dL (ref 70–99)
Glucose-Capillary: 88 mg/dL (ref 70–99)

## 2020-10-15 MED ORDER — VITAL AF 1.2 CAL PO LIQD: 1000.0000 mL | ORAL | Status: DC

## 2020-10-15 MED ORDER — FUROSEMIDE 10 MG/ML IJ SOLN
40.0000 mg | Freq: Once | INTRAMUSCULAR | Status: AC
  Administered 2020-10-16: 40 mg via INTRAVENOUS
  Filled 2020-10-15: qty 4

## 2020-10-15 MED ORDER — PROSOURCE TF PO LIQD
45.0000 mL | Freq: Two times a day (BID) | ORAL | Status: DC
  Filled 2020-10-15: qty 45

## 2020-10-15 NOTE — Progress Notes (Signed)
eLink Physician-Brief Progress Note Patient Name: Tommy Perez DOB: June 26, 1966 MRN: [id number]   Date of Service  10/15/2020  HPI/Events of Note  Patient with persistent desaturation, and increased oxygen requirement.  eICU Interventions  Stat portable CXR, BNP ordered.        Migdalia Dk 10/15/2020, 10:37 PM

## 2020-10-15 NOTE — Progress Notes (Signed)
NAME:  Tommy Perez, MRN:  [id number], DOB:  [birth year], LOS: 8 ADMISSION DATE:  10/13/2020, CONSULTATION DATE: 09/30/2020 REFERRING MD: Dr. Lorin Mercy, [facility] CHIEF COMPLAINT: Status post cardiac arrest  Brief History:  55 yo male smoker admitted to [facility] 2/09 with Rt ACA CVA and HTN emergency with acute diastolic CHF.  He was to be d/c to CIR, but opted to go home.  he presented to ER on 2/16 with garbled speech and leg weakness.  Developed bradycardia leading to PEA arrest on 2/16 with ROSC after 21 minutes.  Found to have aspiration pneumonitis.  Past Medical History:  HTN, DM type 2  Significant Hospital Events:  2/09 Admit to [facility] 2/15 d/c home 2/16 admit to [facility], PEA arrest from aspiration pneumonitis  Consults:    Significant Diagnostic Tests:   CT head 2/16 >> atrophy, periventricular small vessel disease, prior infarcts at several sites  Abd u/s 2/17 >> cholelithiasis w/o acute cholecystitis, b/l effusions Rt > Lt, trace ascites, medical renal disease  Bronchoscopy 2/20 >>   Micro Data:  MRSA PCR 2/16 >> negative Sputum 2/17 >> candida BAL 2/20 >> 60K yeast  Antimicrobials:  Levaquin 2/17 >> 2/23 eraxis 2/22->2/24 Subjective/Interim history:  2/24: awake and following commands, appears comfortable on vent today. sbt in progress on 10/8 will attempt to lower settings and then hoepful extubation today. TOC working on dispo as well with family.  2/23: tolerating sbt but with increased ps up to 14. Will cont to try to decrease this. Yesterday had issue with hypoxemia that req increased peep and fio2 but improved today. Possible that we will be able to wean and then extubate in next 24 hours hopefully once family has clear plan delineated   2/22: pt did not tolerate sbt this am. Awake and following commands, visiting with family. Secretions still persist, perhaps a bit improved. Renal function stable. Updated family and him that we need a clear plan once able to be extubated  If  he deteriorates 1. reintubate and trach or 2. Transition to comfort 2/21: copious secretions. Awake and following commands. S/p bronch yesterday with some improvement of RLL ? If he will be able to maintain clearance s/p intubation/weakness and recent cva.   Objective   Blood pressure (!) 144/79, pulse 76, temperature 97.6 F (36.4 C), temperature source Axillary, resp. rate 16, weight 68.6 kg, SpO2 100 %.    Vent Mode: PSV FiO2 (%):  [30 %-40 %] 30 % Set Rate:  [18 bmp] 18 bmp Vt Set:  [600 mL] 600 mL PEEP:  [8 cmH20] 8 cmH20 Pressure Support:  [5 cmH20-10 cmH20] 5 cmH20 Plateau Pressure:  [20 cmH20-23 cmH20] 23 cmH20   Intake/Output Summary (Last 24 hours) at 10/15/2020 1316 Last data filed at 10/15/2020 1100 Gross per 24 hour  Intake 1733.13 ml  Output 1900 ml  Net -166.87 ml   Filed Weights   10/12/20 0359 10/14/20 0500 10/15/20 0500  Weight: 72.6 kg 69.2 kg 68.6 kg    Examination:  General - on vent, awake and following commands Eyes - pupils reactive, non icteric ENT - no sinus tenderness, no stridor, mmmp Cardiac - regular rate/rhythm, no murmur Chest -rales Abdomen - soft, mildly tender, + bowel sounds Extremities - no cyanosis, clubbing, or edema, poor hygiene of feet Skin - scaling b/l le Neuro - RASS 0  Resolved problems:  PEA cardiac arrest  Assessment & Plan:   Acute hypoxic respiratory failure from aspiration pneumonitis, acute pulmonary edema, b/l transudate pleural effusions.  Mucus plugging of RML and RLL. Tobacco abuse. - pressure support as able on 10/8 today with tolerating sbt - last dose of ntifungal today - cxr with increasing density again of RLL, personally reviewed by me - completed 7 days levaquin; has PCN allergy - nicotine patch -secretions improved -plan would be for re-intubation and trach if fails   Acute on chronic diastolic CHF. Hx of HTN, HLD. - goal SBP < 160 - continue norvasc, lipitor, coreg, hydralazine  Acute metabolic  encephalopathy after cardiac arrest with recent CVA. - RASS goal 0 - continue ASA, plavix  CKD 3b. - f/u BMET - monitor urine outpt  Elevated LFTs 2nd to hypoxia. - abdominal u/s unremarkable for acute process - f/u LFTs intermittently with increase in numbers, bili stable  Severe protein calorie malnutrition. - tube feeds  DM type 2 poorly controlled with hyperglycemia. -SSI - hold outpt jardiance, metformin  Social determinants of health. - pt will need to be able to be extubated to eval competent ability, he is able to follow commands but cannot assign hcpoa at this time.   Best practice (evaluated daily)  Diet: tube feeds DVT prophylaxis: Lovenox GI prophylaxis: Protonix Mobility: Bedrest Disposition: ICU   Goals of Care:  Code Status: Full code  Labs    CMP Latest Ref Rng & Units 10/15/2020 10/14/2020 10/13/2020  Glucose 70 - 99 mg/dL 154(H) 146(H) 151(H)  BUN 6 - 20 mg/dL 58(H) 54(H) 53(H)  Creatinine 0.61 - 1.24 mg/dL 2.98(H) 2.91(H) 3.00(H)  Sodium 135 - 145 mmol/L 147(H) 148(H) 146(H)  Potassium 3.5 - 5.1 mmol/L 4.2 4.4 4.1  Chloride 98 - 111 mmol/L 111 113(H) 112(H)  CO2 22 - 32 mmol/L _0 Calcium 8.9 - 10.3 mg/dL 8.1(L) 8.0(L) 7.9(L)  Total Protein 6.5 - 8.1 g/dL 6.0(L) 5.7(L) 5.6(L)  Total Bilirubin 0.3 - 1.2 mg/dL 0.9 0.8 0.8  Alkaline Phos 38 - 126 U/L 444(H) 435(H) 402(H)  AST 15 - 41 U/L 103(H) 95(H) 73(H)  ALT 0 - 44 U/L 80(H) 77(H) 69(H)    CBC Latest Ref Rng & Units 10/15/2020 10/14/2020 10/13/2020  WBC 4.0 - 10.5 K/uL 10.6(H) 7.4 9.0  Hemoglobin 13.0 - 17.0 g/dL 9.7(L) 9.8(L) 10.1(L)  Hematocrit 39.0 - 52.0 % 30.6(L) 30.7(L) 30.8(L)  Platelets 150 - 400 K/uL 282 259 224    ABG    Component Value Date/Time   PHART 7.326 (L) 10/08/2020 0010   PCO2ART 42.4 10/08/2020 0010   PO2ART 295 (H) 10/08/2020 0010   HCO3 24.0 10/08/2020 0010   TCO2 26 10/08/2020 0010   ACIDBASEDEF 4.0 (H) 10/08/2020 0010   O2SAT 100.0 10/08/2020 0010    CBG  (last 3)  Recent Labs    10/15/20 0328 10/15/20 0756 10/15/20 1254  GLUCAP 161* 142* 169*    Critical care time: The patient is critically ill with multiple organ systems failure and requires high complexity decision making for assessment and support, frequent evaluation and titration of therapies, application of advanced monitoring technologies and extensive interpretation of multiple databases.  Critical care time 32 mins. This represents my time independent of the NPs time taking care of the pt. This is excluding procedures.    Audria Nine DO [place] [facility] 10/15/2020, 1:16 PM See Amion for pager If no response to pager, please call [phone] until 1900 After 1900 please call [facility] [phone]

## 2020-10-15 NOTE — TOC Progression Note (Addendum)
Transition of Care [facility]) - Progression Note    Patient Details  Name: Haroun Cotham MRN: [id number] Date of Birth: March 05, 1966  Transition of Care [facility]) CM/SW Contact  Kermit Balo, RN Phone Number: 10/15/2020, 11:38 AM  Clinical Narrative:    [facility] has offered a bed pending insurance. CM has reached out to pts cousin: [name] and she is in agreement with starting insurance. CM has updated Irving Burton at [facility]. [name] to take a tour tomorrow.  TOC following.  1630: [facility] as received Therapist, occupational for admission for potentially tomorrow. CM has updated pts cousin: [name]. She is touring the facility tomorrow and CM will follow up with MD tomorrow to see if pt is ready to d/c to LTACH>     Barriers to Discharge: Continued Medical Work up  Expected Discharge Plan and Services     Discharge Planning Services: CM Consult   Living arrangements for the past 2 months: Single Family Home                                       Social Determinants of Health (SDOH) Interventions    Readmission Risk Interventions No flowsheet data found.

## 2020-10-15 NOTE — Progress Notes (Addendum)
Referred back pt to e link nurse, and informed that pt still desaturating even O2 increase to 10 LPM, with O2Sat=83-85%, pt RR- 24-28/min, pt still trying hard to cough out secretion, with frequent use of flutter wave valve, elink nurse said will refer pt to Dr Warrick Parisian

## 2020-10-15 NOTE — Progress Notes (Signed)
Nutrition Follow-up  DOCUMENTATION CODES:   Not applicable  INTERVENTION:   Tube Feeding via OG: Increase Vital AF 1.2 to 65 ml/hr Pro-Source TF 45 mL daily TF goal regimen provides 1912 kcals, 128 g of protein and 1264 mL of free water   NUTRITION DIAGNOSIS:   Inadequate oral intake related to acute illness as evidenced by NPO status.  Being addressed via TF   GOAL:   Patient will meet greater than or equal to 90% of their needs  Progressing  MONITOR:   Vent status,TF tolerance,Weight trends,Labs  REASON FOR ASSESSMENT:   Consult,Ventilator Enteral/tube feeding initiation and management  ASSESSMENT:   55 yo male admitted post recent CVA with cardiac arrest likely from aspiration event. PMH includes DM, HTN   2/09 Admit to [facility] with CVA, HTN emergency 2/15 D/C to home 2/16 Admit to [facility], PEA arrest, intubated  Noted [facility] has offered bed pending insurance.  Noted plan to work towards extubation with plan for re-intubation with trach if fails  Vital AF 1.2 at 55 ml/hr via OG  Current wt 68.6 kg; admit weight 76 kg.  Noted newly documented DTI  Labs: sodium 147 (H), Creatinine 2.98, BUN 58, corrected calcium 10.3 (albumin 1.2) Meds: ss novolog  Diet Order:   Diet Order            Diet NPO time specified  Diet effective now                 EDUCATION NEEDS:   Not appropriate for education at this time  Skin:  Skin Assessment: Skin Integrity Issues: Skin Integrity Issues:: DTI DTI: sacrum  Last BM:  2/23  Height:   Ht Readings from Last 1 Encounters:  09/29/20 5\' 11"  (1.803 m)    Weight:   Wt Readings from Last 1 Encounters:  10/15/20 68.6 kg   BMI:  Body mass index is 21.09 kg/m.  Estimated Nutritional Needs:   Kcal:  1900-2100 kcals  Protein:  115-130 g  Fluid:  >/= 1.9 L   10/17/20 MS, RDN, LDN, CNSC Registered Dietitian III Clinical Nutrition RD Pager and On-Call Pager Number Located in [place]

## 2020-10-15 NOTE — Progress Notes (Signed)
eLink Physician-Brief Progress Note Patient Name: Tommy Perez DOB: 10-25-1965 MRN: [id number]   Date of Service  10/15/2020  HPI/Events of Note  Transient desaturation in the context of a weak cough, patient extubated earlier today, saturation back to 94-96 % with increase in O2 from 6 to 7 L  / minute.  eICU Interventions  Flutter wave valve, NTS, Chest PT, CXR if desaturation recurs.        Tommy Perez 10/15/2020, 10:00 PM

## 2020-10-15 NOTE — Progress Notes (Signed)
Referred pt to e link spoke to Ms Misty Stanley, informed that pt extubated today, on [place] at 6 LPM desaturating to 82-86%, but still looked comfortable, pt trying  hard to cough out secretion but failed due to weak coughing ability, tried to suction with small amount of white secretion, already seen by RT, put pt on salter at 7 LPM and O2 Sat improved to 94-96%, RT advised for xray, and last xray was done 2/23, recommended for mucinex, repeat xray and lasix, Ms Kennyth Arnold will discuss problem with the doctor

## 2020-10-15 NOTE — Progress Notes (Signed)
RT called to bedside for patient desatting on 6L.  Patient satting 88% when RT arrived.  Patient trying to cough up secretions with yaunker.  BS rhonchi bilaterally.  RT suggest patient get a flutter valve to help assist patient with his cough.  Patient now on 7L salter satting 96%.  RT will continue to monitor.

## 2020-10-15 NOTE — Progress Notes (Signed)
With new order from DR Ogan, pt is for stat chest xray and bld work

## 2020-10-16 DIAGNOSIS — E1122 Type 2 diabetes mellitus with diabetic chronic kidney disease: Secondary | ICD-10-CM | POA: Diagnosis not present

## 2020-10-16 DIAGNOSIS — J9601 Acute respiratory failure with hypoxia: Secondary | ICD-10-CM | POA: Diagnosis not present

## 2020-10-16 DIAGNOSIS — N179 Acute kidney failure, unspecified: Secondary | ICD-10-CM

## 2020-10-16 DIAGNOSIS — N1831 Chronic kidney disease, stage 3a: Secondary | ICD-10-CM | POA: Diagnosis not present

## 2020-10-16 LAB — COMPREHENSIVE METABOLIC PANEL
ALT: 83 U/L — ABNORMAL HIGH (ref 0–44)
AST: 85 U/L — ABNORMAL HIGH (ref 15–41)
Albumin: 1.4 g/dL — ABNORMAL LOW (ref 3.5–5.0)
Alkaline Phosphatase: 443 U/L — ABNORMAL HIGH (ref 38–126)
Anion gap: 11 (ref 5–15)
BUN: 62 mg/dL — ABNORMAL HIGH (ref 6–20)
CO2: 23 mmol/L (ref 22–32)
Calcium: 8.4 mg/dL — ABNORMAL LOW (ref 8.9–10.3)
Chloride: 113 mmol/L — ABNORMAL HIGH (ref 98–111)
Creatinine, Ser: 3.23 mg/dL — ABNORMAL HIGH (ref 0.61–1.24)
GFR, Estimated: 22 mL/min — ABNORMAL LOW (ref >60)
Glucose, Bld: 96 mg/dL (ref 70–99)
Potassium: 4.8 mmol/L (ref 3.5–5.1)
Sodium: 147 mmol/L — ABNORMAL HIGH (ref 135–145)
Total Bilirubin: 1 mg/dL (ref 0.3–1.2)
Total Protein: 6.3 g/dL — ABNORMAL LOW (ref 6.5–8.1)

## 2020-10-16 LAB — GLUCOSE, CAPILLARY
Glucose-Capillary: 108 mg/dL — ABNORMAL HIGH (ref 70–99)
Glucose-Capillary: 112 mg/dL — ABNORMAL HIGH (ref 70–99)
Glucose-Capillary: 121 mg/dL — ABNORMAL HIGH (ref 70–99)
Glucose-Capillary: 129 mg/dL — ABNORMAL HIGH (ref 70–99)
Glucose-Capillary: 94 mg/dL (ref 70–99)
Glucose-Capillary: 97 mg/dL (ref 70–99)

## 2020-10-16 LAB — CBC
HCT: 33.9 % — ABNORMAL LOW (ref 39.0–52.0)
Hemoglobin: 10.8 g/dL — ABNORMAL LOW (ref 13.0–17.0)
MCH: 27 pg (ref 26.0–34.0)
MCHC: 31.9 g/dL (ref 30.0–36.0)
MCV: 84.8 fL (ref 80.0–100.0)
Platelets: 298 10*3/uL (ref 150–400)
RBC: 4 MIL/uL — ABNORMAL LOW (ref 4.22–5.81)
RDW: 16.6 % — ABNORMAL HIGH (ref 11.5–15.5)
WBC: 11.3 10*3/uL — ABNORMAL HIGH (ref 4.0–10.5)
nRBC: 0 % (ref 0.0–0.2)

## 2020-10-16 LAB — MAGNESIUM: Magnesium: 2 mg/dL (ref 1.7–2.4)

## 2020-10-16 LAB — BRAIN NATRIURETIC PEPTIDE: B Natriuretic Peptide: 1384.9 pg/mL — ABNORMAL HIGH (ref 0.0–100.0)

## 2020-10-16 MED ORDER — FUROSEMIDE 10 MG/ML IJ SOLN
80.0000 mg | Freq: Once | INTRAMUSCULAR | Status: AC
  Administered 2020-10-16: 80 mg via INTRAVENOUS
  Filled 2020-10-16: qty 8

## 2020-10-16 NOTE — Progress Notes (Signed)
Spoke with RD Ogan, seen chest xray with order to give lasix IV- given and to put pt on BiPAP, RT Jessica aware

## 2020-10-16 NOTE — Progress Notes (Signed)
   10/16/20 1133  Clinical Encounter Type  Visited With Patient and family together;Family  Visit Type Initial  Referral From Nurse  Consult/Referral To Chaplain   Chaplain responded to page for family support. Pt's cousins, Marcial Pacas and [employer], were requesting clarification regarding AD. Chaplain explained hospital only notarizes HCPOA, not durable POA. Chaplain provided AD education for patient and Pt's family. Pt wishes Marcial Pacas to be primary HCPOA and [name] to be the back-up. Chaplain checked all paperwork is ready for notary. Chaplain is working with notary to have AD notarized at 2:30pm today. Chaplain remains available.  This note was prepared by Chaplain Resident, Tacy Learn, MDiv. Chaplain remains available as needed through the on-call pager: [phone].

## 2020-10-16 NOTE — TOC Progression Note (Signed)
Transition of Care [facility]) - Progression Note    Patient Details  Name: Tommy Perez MRN: [id number] Date of Birth: Apr 12, 1966  Transition of Care [facility]) CM/SW Contact  Kermit Balo, RN Phone Number: 10/16/2020, 5:31 PM  Clinical Narrative:    Per MD pt not ready for [facility] today. Will re-assess in am. Irving Burton with [facility] updated and cousin [name]  Updated.  TOC following.     Barriers to Discharge: Continued Medical Work up  Expected Discharge Plan and Services     Discharge Planning Services: CM Consult   Living arrangements for the past 2 months: Single Family Home                                       Social Determinants of Health (SDOH) Interventions    Readmission Risk Interventions No flowsheet data found.

## 2020-10-16 NOTE — Progress Notes (Signed)
eLink Physician-Brief Progress Note Patient Name: Tommy Perez DOB: 01-26-1966 MRN: [id number]   Date of Service  10/16/2020  HPI/Events of Note  CXR consistent with pulmonary edema.  eICU Interventions  Lasix 40 mg iv x 1, BIPAP ordered pending diuresis.        [name] [name] 10/16/2020, 12:02 AM

## 2020-10-16 NOTE — Progress Notes (Signed)
   10/16/20 1410  Clinical Encounter Type  Visited With Patient  Visit Type Follow-up  Referral From Nurse  Consult/Referral To Chaplain   Chaplain coordinated notary and witnesses for AD. AD was notarized and witnessed by two witnesses. A copy of the completed AD was placed in Pt's physical chart and two copies were given to the Pt along with the original. Chaplain remains available.  This note was prepared by Chaplain Resident, Tacy Learn, MDiv. Chaplain remains available as needed through the on-call pager: [phone].

## 2020-10-16 NOTE — Progress Notes (Signed)
NAME:  Tommy Perez, MRN:  [id number], DOB:  1966/05/25, LOS: 9 ADMISSION DATE:  10/12/2020, CONSULTATION DATE: 10/12/2020 REFERRING MD: Dr. Lorin Mercy, [facility] CHIEF COMPLAINT: Status post cardiac arrest  Brief History:  55 yo male smoker admitted to [facility] 2/09 with Rt ACA CVA and HTN emergency with acute diastolic CHF.  He was to be d/c to CIR, but opted to go home.  he presented to ER on 2/16 with garbled speech and leg weakness.  Developed bradycardia leading to PEA arrest on 2/16 with ROSC after 21 minutes.  Found to have aspiration pneumonitis.  Past Medical History:  HTN, DM type 2  Significant Hospital Events:  2/09 Admit to [facility] 2/15 d/c home 2/16 admit to [facility], PEA arrest from aspiration pneumonitis  Consults:    Significant Diagnostic Tests:   CT head 2/16 >> atrophy, periventricular small vessel disease, prior infarcts at several sites  Abd u/s 2/17 >> cholelithiasis w/o acute cholecystitis, b/l effusions Rt > Lt, trace ascites, medical renal disease  Bronchoscopy 2/20 >>   Micro Data:  MRSA PCR 2/16 >> negative Sputum 2/17 >> candida BAL 2/20 >> 60K yeast  Antimicrobials:  Levaquin 2/17 >> 2/23 eraxis 2/22->2/24 Subjective/Interim history:  2/25: Last night patient became hypoxic, tachypneic requiring BiPAP throughout the night with improvement in oxygen saturation and work of breathing 2/24: awake and following commands, appears comfortable on vent today. sbt in progress on 10/8 will attempt to lower settings and then hoepful extubation today. TOC working on dispo as well with family.  2/23: tolerating sbt but with increased ps up to 14. Will cont to try to decrease this. Yesterday had issue with hypoxemia that req increased peep and fio2 but improved today. Possible that we will be able to wean and then extubate in next 24 hours hopefully once family has clear plan delineated   2/22: pt did not tolerate sbt this am. Awake and following commands, visiting with family.  Secretions still persist, perhaps a bit improved. Renal function stable. Updated family and him that we need a clear plan once able to be extubated  If he deteriorates 1. reintubate and trach or 2. Transition to comfort 2/21: copious secretions. Awake and following commands. S/p bronch yesterday with some improvement of RLL ? If he will be able to maintain clearance s/p intubation/weakness and recent cva.   Objective   Blood pressure (!) 141/76, pulse 74, temperature (!) 97.4 F (36.3 C), temperature source Axillary, resp. rate 13, weight 68.4 kg, SpO2 93 %.    FiO2 (%):  [60 %] 60 %   Intake/Output Summary (Last 24 hours) at 10/16/2020 1335 Last data filed at 10/16/2020 1100 Gross per 24 hour  Intake 110 ml  Output 1900 ml  Net -1790 ml   Filed Weights   10/14/20 0500 10/15/20 0500 10/16/20 0425  Weight: 69.2 kg 68.6 kg 68.4 kg    Examination:  General -middle-aged African-American male, lying on the bed on BiPAP  HEENT: Atraumatic, normocephalic, dry mucous membranes, no JVD Cardiac -regular rate and rhythm, no murmur/gallop Chest -bilateral crackles, no wheezes Abdomen - soft, mildly tender, + bowel sounds Extremities - no cyanosis, clubbing, or edema, poor hygiene of feet Skin - scaling b/l le Neuro -opens eyes with vocal stimuli, following commands  Resolved problems:  PEA cardiac arrest  Assessment & Plan:   Acute hypoxic respiratory failure from aspiration pneumonitis, acute pulmonary edema, b/l transudate pleural effusions. Mucus plugging of RML and RLL. Tobacco abuse.  Patient was extubated yesterday,  last night he became hypoxic, likely due to volume overload Requiring BiPAP throughout the night and this morning His oxygen saturation and work of breathing improved with BiPAP Completed antibiotic therapy Continue aggressive diuresis Monitor intake and output nicotine patch  Acute on chronic diastolic CHF. Patient is still looks volume overloaded Lasix 80 mg  IV x1 Monitor intake output and daily weight Continue Coreg and hydralazine  Acute metabolic encephalopathy after cardiac arrest with recent CVA. RASS goal 0, currently off sedation Continue aspirin and Plavix  AKI on CKD 3b. Likely due to cardiorenal syndrome Follow BMP monitor urine outpt  Severe protein calorie malnutrition. Dietitian follow-up  DM type 2 poorly controlled with hyperglycemia. -SSI - hold outpt jardiance, metformin  Best practice (evaluated daily)  Diet: NPO DVT prophylaxis: Lovenox GI prophylaxis: Protonix Mobility: As tolerated Disposition: ICU   Goals of Care:  Code Status: Full code  Labs    CMP Latest Ref Rng & Units 10/16/2020 10/15/2020 10/14/2020  Glucose 70 - 99 mg/dL 96 154(H) 146(H)  BUN 6 - 20 mg/dL 62(H) 58(H) 54(H)  Creatinine 0.61 - 1.24 mg/dL 3.23(H) 2.98(H) 2.91(H)  Sodium 135 - 145 mmol/L 147(H) 147(H) 148(H)  Potassium 3.5 - 5.1 mmol/L 4.8 4.2 4.4  Chloride 98 - 111 mmol/L 113(H) 111 113(H)  CO2 22 - 32 mmol/L _0 Calcium 8.9 - 10.3 mg/dL 8.4(L) 8.1(L) 8.0(L)  Total Protein 6.5 - 8.1 g/dL 6.3(L) 6.0(L) 5.7(L)  Total Bilirubin 0.3 - 1.2 mg/dL 1.0 0.9 0.8  Alkaline Phos 38 - 126 U/L 443(H) 444(H) 435(H)  AST 15 - 41 U/L 85(H) 103(H) 95(H)  ALT 0 - 44 U/L 83(H) 80(H) 77(H)    CBC Latest Ref Rng & Units 10/16/2020 10/15/2020 10/14/2020  WBC 4.0 - 10.5 K/uL 11.3(H) 10.6(H) 7.4  Hemoglobin 13.0 - 17.0 g/dL 10.8(L) 9.7(L) 9.8(L)  Hematocrit 39.0 - 52.0 % 33.9(L) 30.6(L) 30.7(L)  Platelets 150 - 400 K/uL 298 282 259    ABG    Component Value Date/Time   PHART 7.326 (L) 10/08/2020 0010   PCO2ART 42.4 10/08/2020 0010   PO2ART 295 (H) 10/08/2020 0010   HCO3 24.0 10/08/2020 0010   TCO2 26 10/08/2020 0010   ACIDBASEDEF 4.0 (H) 10/08/2020 0010   O2SAT 100.0 10/08/2020 0010    CBG (last 3)  Recent Labs    10/16/20 0345 10/16/20 0735 10/16/20 1233  GLUCAP 94 97 108*    Total critical care time: 45 minutes  Performed  by: [place] care time was exclusive of separately billable procedures and treating other patients.   Critical care was necessary to treat or prevent imminent or life-threatening deterioration.   Critical care was time spent personally by me on the following activities: development of treatment plan with patient and/or surrogate as well as nursing, discussions with consultants, evaluation of patient's response to treatment, examination of patient, obtaining history from patient or surrogate, ordering and performing treatments and interventions, ordering and review of laboratory studies, ordering and review of radiographic studies, pulse oximetry and re-evaluation of patient's condition.   Jacky Kindle MD [facility] See Amion for pager If no response to pager, please call [phone] until 7pm After 7pm, Please call E-link [phone]

## 2020-10-17 ENCOUNTER — Inpatient Hospital Stay (HOSPITAL_COMMUNITY): Payer: 59

## 2020-10-17 DIAGNOSIS — F17210 Nicotine dependence, cigarettes, uncomplicated: Secondary | ICD-10-CM | POA: Diagnosis not present

## 2020-10-17 DIAGNOSIS — N179 Acute kidney failure, unspecified: Secondary | ICD-10-CM | POA: Diagnosis not present

## 2020-10-17 DIAGNOSIS — E1122 Type 2 diabetes mellitus with diabetic chronic kidney disease: Secondary | ICD-10-CM | POA: Diagnosis not present

## 2020-10-17 DIAGNOSIS — I63521 Cerebral infarction due to unspecified occlusion or stenosis of right anterior cerebral artery: Secondary | ICD-10-CM | POA: Diagnosis not present

## 2020-10-17 LAB — CBC
HCT: 34.4 % — ABNORMAL LOW (ref 39.0–52.0)
Hemoglobin: 10.5 g/dL — ABNORMAL LOW (ref 13.0–17.0)
MCH: 27 pg (ref 26.0–34.0)
MCHC: 30.5 g/dL (ref 30.0–36.0)
MCV: 88.4 fL (ref 80.0–100.0)
Platelets: 383 10*3/uL (ref 150–400)
RBC: 3.89 MIL/uL — ABNORMAL LOW (ref 4.22–5.81)
RDW: 16.9 % — ABNORMAL HIGH (ref 11.5–15.5)
WBC: 16.2 10*3/uL — ABNORMAL HIGH (ref 4.0–10.5)
nRBC: 0 % (ref 0.0–0.2)

## 2020-10-17 LAB — GLUCOSE, CAPILLARY
Glucose-Capillary: 134 mg/dL — ABNORMAL HIGH (ref 70–99)
Glucose-Capillary: 124 mg/dL — ABNORMAL HIGH (ref 70–99)
Glucose-Capillary: 106 mg/dL — ABNORMAL HIGH (ref 70–99)
Glucose-Capillary: 136 mg/dL — ABNORMAL HIGH (ref 70–99)
Glucose-Capillary: 158 mg/dL — ABNORMAL HIGH (ref 70–99)
Glucose-Capillary: 210 mg/dL — ABNORMAL HIGH (ref 70–99)

## 2020-10-17 LAB — COMPREHENSIVE METABOLIC PANEL
ALT: 63 U/L — ABNORMAL HIGH (ref 0–44)
AST: 46 U/L — ABNORMAL HIGH (ref 15–41)
Albumin: 1.4 g/dL — ABNORMAL LOW (ref 3.5–5.0)
Alkaline Phosphatase: 413 U/L — ABNORMAL HIGH (ref 38–126)
Anion gap: 11 (ref 5–15)
BUN: 87 mg/dL — ABNORMAL HIGH (ref 6–20)
CO2: 26 mmol/L (ref 22–32)
Calcium: 7.9 mg/dL — ABNORMAL LOW (ref 8.9–10.3)
Chloride: 112 mmol/L — ABNORMAL HIGH (ref 98–111)
Creatinine, Ser: 3.76 mg/dL — ABNORMAL HIGH (ref 0.61–1.24)
GFR, Estimated: 18 mL/min — ABNORMAL LOW (ref >60)
Glucose, Bld: 130 mg/dL — ABNORMAL HIGH (ref 70–99)
Potassium: 5.8 mmol/L — ABNORMAL HIGH (ref 3.5–5.1)
Sodium: 149 mmol/L — ABNORMAL HIGH (ref 135–145)
Total Bilirubin: 0.9 mg/dL (ref 0.3–1.2)
Total Protein: 6.6 g/dL (ref 6.5–8.1)

## 2020-10-17 LAB — MAGNESIUM: Magnesium: 2.4 mg/dL (ref 1.7–2.4)

## 2020-10-17 LAB — TRIGLYCERIDES: Triglycerides: 228 mg/dL — ABNORMAL HIGH (ref <150)

## 2020-10-17 MED ORDER — HYDRALAZINE HCL 20 MG/ML IJ SOLN
10.0000 mg | Freq: Once | INTRAMUSCULAR | Status: AC
  Administered 2020-10-17: 10 mg via INTRAVENOUS
  Filled 2020-10-17: qty 1

## 2020-10-17 MED ORDER — CLOPIDOGREL BISULFATE 75 MG PO TABS
75.0000 mg | ORAL_TABLET | Freq: Every day | ORAL | Status: DC
  Administered 2020-10-18 – 2020-10-19 (×2): 75 mg via ORAL
  Filled 2020-10-17 (×2): qty 1

## 2020-10-17 MED ORDER — ATORVASTATIN CALCIUM 40 MG PO TABS
40.0000 mg | ORAL_TABLET | Freq: Every day | ORAL | Status: DC
Start: 2020-10-17 — End: 2020-10-21
  Administered 2020-10-17 – 2020-10-19 (×3): 40 mg via ORAL
  Filled 2020-10-17 (×3): qty 1

## 2020-10-17 MED ORDER — HYDRALAZINE HCL 50 MG PO TABS
100.0000 mg | ORAL_TABLET | Freq: Three times a day (TID) | ORAL | Status: DC
  Administered 2020-10-17 – 2020-10-19 (×8): 100 mg via ORAL
  Filled 2020-10-17 (×8): qty 2

## 2020-10-17 MED ORDER — BETHANECHOL CHLORIDE 5 MG PO TABS
5.0000 mg | ORAL_TABLET | Freq: Three times a day (TID) | ORAL | Status: DC
Start: 2020-10-17 — End: 2020-10-21
  Administered 2020-10-17 – 2020-10-19 (×8): 5 mg via ORAL
  Filled 2020-10-17 (×14): qty 1

## 2020-10-17 MED ORDER — ASPIRIN 81 MG PO CHEW
81.0000 mg | CHEWABLE_TABLET | Freq: Every day | ORAL | Status: DC
  Administered 2020-10-18 – 2020-10-19 (×2): 81 mg via ORAL
  Filled 2020-10-17 (×2): qty 1

## 2020-10-17 MED ORDER — CARVEDILOL 12.5 MG PO TABS
12.5000 mg | ORAL_TABLET | Freq: Two times a day (BID) | ORAL | Status: DC
  Administered 2020-10-17: 12.5 mg via ORAL
  Filled 2020-10-17: qty 1

## 2020-10-17 MED ORDER — ALBUMIN HUMAN 25 % IV SOLN
25.0000 g | Freq: Once | INTRAVENOUS | Status: AC
  Administered 2020-10-17: 25 g via INTRAVENOUS
  Filled 2020-10-17: qty 100

## 2020-10-17 MED ORDER — AMLODIPINE BESYLATE 10 MG PO TABS
10.0000 mg | ORAL_TABLET | Freq: Every day | ORAL | Status: DC
Start: 2020-10-17 — End: 2020-10-21
  Administered 2020-10-18 – 2020-10-19 (×2): 10 mg via ORAL
  Filled 2020-10-17 (×2): qty 1

## 2020-10-17 MED ORDER — LACTATED RINGERS IV BOLUS: 500.0000 mL | Freq: Once | INTRAVENOUS | Status: DC

## 2020-10-17 NOTE — Progress Notes (Signed)
Modified Barium Swallow Progress Note  Patient Details  Name: Tommy Perez MRN: [id number] Date of Birth: 1966/05/21  Today's Date: 10/17/2020  Modified Barium Swallow completed.  Full report located under Chart Review in the Imaging Section.  Brief recommendations include the following:  Clinical Impression  Patient presents with a mild oral dysphagia and a mild to moderate pharyngeal phase dysphagia. Oral phase characterized by delays in anterior to posterior transit of puree solids and nectar thick liquids resulting in premature spillage into vallecular sinus. In addition, patient unable to orally manipulate barium tablet in mouth with puree solids and it became lodged in left buccal cavity. During pharyngeal phase of swallow, patient tolerated nectar thick liquids without penetration or aspiration, only trace pyriform sinus residuals and delay of swallow initiation to level of vallecular sinus. With thin liquids, patient exhibited delay in swallow initiation to level of pyriform sinuses and had one instance of silent aspiration of trace to mild amount of thin liquids before the swallow was initiated. This aspiration even occured when thin liquids had filled up pyriform sinus and then aspiration occured right before swallow triggered. Puree solids were tolerated with swallow initiation delay to vallecular sinus but only intermittent trace pharyngeal residuals.   Swallow Evaluation Recommendations       SLP Diet Recommendations: Dysphagia 1 (Puree) solids;Nectar thick liquid   Liquid Administration via: Cup;Straw   Medication Administration: Crushed with puree   Supervision: Staff to assist with self feeding;Full supervision/cueing for compensatory strategies   Compensations: Minimize environmental distractions;Slow rate;Small sips/bites   Postural Changes: Seated upright at 90 degrees   Oral Care Recommendations: Oral care BID;Staff/trained caregiver to provide oral care      Angela Nevin, MA, CCC-SLP Speech Therapy [facility]

## 2020-10-17 NOTE — Evaluation (Signed)
Clinical/Bedside Swallow Evaluation Patient Details  Name: Tommy Perez MRN: [id number] Date of Birth: [birth year]  Today's Date: 10/17/2020 Time: SLP Start Time (ACUTE ONLY): 1435 SLP Stop Time (ACUTE ONLY): 1500 SLP Time Calculation (min) (ACUTE ONLY): 25 min  Past Medical History:  Past Medical History:  Diagnosis Date  . Ambulatory dysfunction   . Diabetes mellitus, type 2 (HCC)   . Dysphagia following cerebrovascular accident (CVA)   . Hypertension   . Nicotine dependence    Past Surgical History: History reviewed. No pertinent surgical history. HPI:  Patient is a 55 y.o. male with PMH: HTN, DM-2, tobacco use, who had been admitted to [facility] on 2/9 with Right sided CVA and HTN emergency with acute diastolic CHF and was to be discharged to CIR but opted to go home. He presented to ER on 2/16 with garbled speech and leg weakness, developed bradycardia leading to PEA arrest on 2/16 with ROSC after 21 minutes; he was found to have aspiration pneumonitis. He was intubated on 2/16 and self-extubated on 12/24.   Assessment / Plan / Recommendation Clinical Impression  Patient presents with what appears to be a primary pharyngeal phase dysphagia, with oral phase appearing functional with consideration of missing dentition as well as poor quality of dentition. He exhibited immediate throat clearing and cough with thin liquid sips from cup, but difficult to determine if related to aspiration/penetration or from loosening up of phlegm, as patient did expectorate a large amount of thick light yellow secretions.  Patient's SpO2 was maintained in high 90's% and RR was in range of 20-24. Plan for MBS to objectively evaluate patient's swallow function. SLP Visit Diagnosis: Dysphagia, unspecified (R13.10)    Aspiration Risk  Severe aspiration risk    Diet Recommendation NPO;Ice chips PRN after oral care   Medication Administration: Via alternative means    Other  Recommendations Oral Care  Recommendations: Oral care BID   Follow up Recommendations Skilled Nursing facility;24 hour supervision/assistance      Frequency and Duration min 2x/week  1 week       Prognosis Prognosis for Safe Diet Advancement: Good Barriers to Reach Goals: Cognitive deficits      Swallow Study   General Date of Onset: 10/17/20 HPI: Patient is a 55 y.o. male with PMH: HTN, DM-2, tobacco use, who had been admitted to [facility] on 2/9 with Right sided CVA and HTN emergency with acute diastolic CHF and was to be discharged to CIR but opted to go home. He presented to ER on 2/16 with garbled speech and leg weakness, developed bradycardia leading to PEA arrest on 2/16 with ROSC after 21 minutes; he was found to have aspiration pneumonitis. He was intubated on 2/16 and self-extubated on 12/24. Type of Study: Bedside Swallow Evaluation Previous Swallow Assessment: MBS 2 weeks ago during prior admission Diet Prior to this Study: NPO Temperature Spikes Noted: No Respiratory Status: Nasal cannula History of Recent Intubation: Yes Length of Intubations (days): 9 days Date extubated: 08/14/21 Behavior/Cognition: Alert;Cooperative;Pleasant mood Oral Cavity Assessment: Other (comment) (moist, with some thick clear secretions in oral cavity) Oral Care Completed by SLP: Yes Oral Cavity - Dentition: Poor condition;Missing dentition Vision: Functional for self-feeding Self-Feeding Abilities: Able to feed self Patient Positioning: Upright in bed Baseline Vocal Quality: Normal Volitional Cough: Congested Volitional Swallow: Able to elicit    Oral/Motor/Sensory Function Overall Oral Motor/Sensory Function: Within functional limits   Ice Chips     Thin Liquid Thin Liquid: Impaired Presentation: Cup Pharyngeal  Phase Impairments: Suspected  delayed Swallow;Cough - Immediate;Cough - Delayed    Nectar Thick     Honey Thick     Puree     Solid           Angela Nevin, MA, CCC-SLP Speech Therapy [facility] Acute  Re[facility]

## 2020-10-17 NOTE — Progress Notes (Signed)
NAME:  Tommy Perez, MRN:  [id number], DOB:  Aug 14, 1966, LOS: 37 ADMISSION DATE:  09/28/2020, CONSULTATION DATE: 10/06/2020 REFERRING MD: Dr. Lorin Mercy, [facility] CHIEF COMPLAINT: Status post cardiac arrest  Brief History:  55 yo male smoker admitted to [facility] 2/09 with Rt ACA CVA and HTN emergency with acute diastolic CHF.  He was to be d/c to CIR, but opted to go home.  he presented to ER on 2/16 with garbled speech and leg weakness.  Developed bradycardia leading to PEA arrest on 2/16 with ROSC after 21 minutes.  Found to have aspiration pneumonitis.  Past Medical History:  HTN, DM type 2  Significant Hospital Events:  2/09 Admit to [facility] 2/15 d/c home 2/16 admit to [facility], PEA arrest from aspiration pneumonitis  2/25: Last night patient became hypoxic, tachypneic requiring BiPAP throughout the night with improvement in oxygen saturation and work of breathing 2/24: awake and following commands, appears comfortable on vent today. sbt in progress on 10/8 will attempt to lower settings and then hoepful extubation today. TOC working on dispo as well with family.  2/23: tolerating sbt but with increased ps up to 14. Will cont to try to decrease this. Yesterday had issue with hypoxemia that req increased peep and fio2 but improved today. Possible that we will be able to wean and then extubate in next 24 hours hopefully once family has clear plan delineated   2/22: pt did not tolerate sbt this am. Awake and following commands, visiting with family. Secretions still persist, perhaps a bit improved. Renal function stable. Updated family and him that we need a clear plan once able to be extubated  If he deteriorates 1. reintubate and trach or 2. Transition to comfort 2/21: copious secretions. Awake and following commands. S/p bronch yesterday with some improvement of RLL ? If he will be able to maintain clearance s/p intubation/weakness and recent cva.    Consults:    Significant Diagnostic Tests:   CT head  2/16 >> atrophy, periventricular small vessel disease, prior infarcts at several sites  Abd u/s 2/17 >> cholelithiasis w/o acute cholecystitis, b/l effusions Rt > Lt, trace ascites, medical renal disease  Bronchoscopy 2/20 >>   Micro Data:  MRSA PCR 2/16 >> negative Sputum 2/17 >> candida BAL 2/20 >> 60K yeast  Antimicrobials:  Levaquin 2/17 >> 2/23 eraxis 2/22->2/24 Subjective/Interim history:    Objective   Blood pressure (!) 152/90, pulse 91, temperature 97.6 F (36.4 C), temperature source Axillary, resp. rate (!) 22, weight 68.4 kg, SpO2 95 %.    FiO2 (%):  [40 %-80 %] 40 %   Intake/Output Summary (Last 24 hours) at 10/17/2020 1055 Last data filed at 10/17/2020 0800 Gross per 24 hour  Intake --  Output 1620 ml  Net -1620 ml   Filed Weights   10/14/20 0500 10/15/20 0500 10/16/20 0425  Weight: 69.2 kg 68.6 kg 68.4 kg    Examination:  General -middle-aged African-American male, lying on the bed, currently on high flow nasal cannula HEENT: Atraumatic, normocephalic, dry mucous membranes, no JVD Cardiac -regular rate and rhythm, no murmur/gallop Chest -bilateral crackles, absent air entry at the bases bilaterally, no wheezes Abdomen - soft, mildly tender, + bowel sounds Extremities - no cyanosis, clubbing, or edema, poor hygiene of feet Skin - scaling b/l le Neuro -opens eyes with vocal stimuli, following commands  Resolved problems:  PEA cardiac arrest  Assessment & Plan:   Acute hypoxic respiratory failure from aspiration pneumonitis, acute pulmonary edema, b/l large transudate pleural  effusions. Mucus plugging of RML and RLL. Tobacco abuse.  Patient required BiPAP overnight this morning BiPAP was taken off, he is on 15 high flow nasal cannula with O2 sat about 95% Completed antibiotic therapy I think intravascularly he is dry that led to worsening serum creatinine with aggressive diuresis We will give him albumin 25% Monitor intake and output nicotine  patch  Acute on chronic diastolic CHF. Patient is still looks volume overloaded with third spacing but intravascularly he is dry Holding diuresis now Monitor intake output and daily weight Continue Coreg and hydralazine  Acute metabolic encephalopathy after cardiac arrest with recent CVA. RASS goal 0, currently off sedation, mental status is improved Continue aspirin and Plavix  AKI on CKD 3b. Likely due to cardiorenal syndrome and aggressive diuresis Patient serum creatinine continue to trend up now is 3.7 Holding diuretic, will give him 25% albumin Follow BMP monitor urine outpt  Severe protein calorie malnutrition. Dietitian follow-up  DM type 2 poorly controlled with hyperglycemia. -SSI - hold outpt jardiance, metformin  Best practice (evaluated daily)  Diet: NPO, speech and swallow evaluation is requested DVT prophylaxis: Lovenox GI prophylaxis: Protonix Mobility: As tolerated Disposition: ICU   Goals of Care:  Code Status: Full code  Labs    CMP Latest Ref Rng & Units 10/17/2020 10/16/2020 10/15/2020  Glucose 70 - 99 mg/dL 130(H) 96 154(H)  BUN 6 - 20 mg/dL 87(H) 62(H) 58(H)  Creatinine 0.61 - 1.24 mg/dL 3.76(H) 3.23(H) 2.98(H)  Sodium 135 - 145 mmol/L 149(H) 147(H) 147(H)  Potassium 3.5 - 5.1 mmol/L 5.8(H) 4.8 4.2  Chloride 98 - 111 mmol/L 112(H) 113(H) 111  CO2 22 - 32 mmol/L _0 Calcium 8.9 - 10.3 mg/dL 7.9(L) 8.4(L) 8.1(L)  Total Protein 6.5 - 8.1 g/dL 6.6 6.3(L) 6.0(L)  Total Bilirubin 0.3 - 1.2 mg/dL 0.9 1.0 0.9  Alkaline Phos 38 - 126 U/L 413(H) 443(H) 444(H)  AST 15 - 41 U/L 46(H) 85(H) 103(H)  ALT 0 - 44 U/L 63(H) 83(H) 80(H)    CBC Latest Ref Rng & Units 10/17/2020 10/16/2020 10/15/2020  WBC 4.0 - 10.5 K/uL 16.2(H) 11.3(H) 10.6(H)  Hemoglobin 13.0 - 17.0 g/dL 10.5(L) 10.8(L) 9.7(L)  Hematocrit 39.0 - 52.0 % 34.4(L) 33.9(L) 30.6(L)  Platelets 150 - 400 K/uL 383 298 282    ABG    Component Value Date/Time   PHART 7.326 (L) 10/08/2020 0010    PCO2ART 42.4 10/08/2020 0010   PO2ART 295 (H) 10/08/2020 0010   HCO3 24.0 10/08/2020 0010   TCO2 26 10/08/2020 0010   ACIDBASEDEF 4.0 (H) 10/08/2020 0010   O2SAT 100.0 10/08/2020 0010    CBG (last 3)  Recent Labs    10/16/20 2334 10/17/20 0358 10/17/20 0733  GLUCAP 129* 134* 124*    Total critical care time: 39 minutes  Performed by: [place] care time was exclusive of separately billable procedures and treating other patients.   Critical care was necessary to treat or prevent imminent or life-threatening deterioration.   Critical care was time spent personally by me on the following activities: development of treatment plan with patient and/or surrogate as well as nursing, discussions with consultants, evaluation of patient's response to treatment, examination of patient, obtaining history from patient or surrogate, ordering and performing treatments and interventions, ordering and review of laboratory studies, ordering and review of radiographic studies, pulse oximetry and re-evaluation of patient's condition.   Jacky Kindle MD [facility] See Amion for pager If no  response to pager, please call [phone] until 7pm After 7pm, Please call E-link [phone]

## 2020-10-18 ENCOUNTER — Other Ambulatory Visit: Payer: Self-pay

## 2020-10-18 DIAGNOSIS — N179 Acute kidney failure, unspecified: Secondary | ICD-10-CM | POA: Diagnosis not present

## 2020-10-18 DIAGNOSIS — E1122 Type 2 diabetes mellitus with diabetic chronic kidney disease: Secondary | ICD-10-CM | POA: Diagnosis not present

## 2020-10-18 DIAGNOSIS — F17210 Nicotine dependence, cigarettes, uncomplicated: Secondary | ICD-10-CM | POA: Diagnosis not present

## 2020-10-18 DIAGNOSIS — R262 Difficulty in walking, not elsewhere classified: Secondary | ICD-10-CM | POA: Diagnosis not present

## 2020-10-18 LAB — BASIC METABOLIC PANEL
Anion gap: 14 (ref 5–15)
BUN: 93 mg/dL — ABNORMAL HIGH (ref 6–20)
CO2: 27 mmol/L (ref 22–32)
Calcium: 7.6 mg/dL — ABNORMAL LOW (ref 8.9–10.3)
Chloride: 112 mmol/L — ABNORMAL HIGH (ref 98–111)
Creatinine, Ser: 3.75 mg/dL — ABNORMAL HIGH (ref 0.61–1.24)
GFR, Estimated: 18 mL/min — ABNORMAL LOW (ref >60)
Glucose, Bld: 182 mg/dL — ABNORMAL HIGH (ref 70–99)
Potassium: 4.7 mmol/L (ref 3.5–5.1)
Sodium: 153 mmol/L — ABNORMAL HIGH (ref 135–145)

## 2020-10-18 LAB — GLUCOSE, CAPILLARY
Glucose-Capillary: 179 mg/dL — ABNORMAL HIGH (ref 70–99)
Glucose-Capillary: 141 mg/dL — ABNORMAL HIGH (ref 70–99)
Glucose-Capillary: 113 mg/dL — ABNORMAL HIGH (ref 70–99)
Glucose-Capillary: 204 mg/dL — ABNORMAL HIGH (ref 70–99)
Glucose-Capillary: 181 mg/dL — ABNORMAL HIGH (ref 70–99)

## 2020-10-18 MED ORDER — ORAL CARE MOUTH RINSE
15.0000 mL | Freq: Two times a day (BID) | OROMUCOSAL | Status: DC
  Administered 2020-10-18 – 2020-10-20 (×4): 15 mL via OROMUCOSAL

## 2020-10-18 MED ORDER — LIP MEDEX EX OINT
TOPICAL_OINTMENT | CUTANEOUS | Status: DC | PRN
  Administered 2020-10-18: 1 via TOPICAL
  Filled 2020-10-18: qty 7

## 2020-10-18 MED ORDER — RESOURCE THICKENUP CLEAR PO POWD
ORAL | Status: DC | PRN
  Filled 2020-10-18: qty 125

## 2020-10-18 MED ORDER — CARVEDILOL 25 MG PO TABS
25.0000 mg | ORAL_TABLET | Freq: Two times a day (BID) | ORAL | Status: DC
  Administered 2020-10-18 – 2020-10-19 (×4): 25 mg via ORAL
  Filled 2020-10-18 (×4): qty 1

## 2020-10-18 MED ORDER — HYDRALAZINE HCL 20 MG/ML IJ SOLN
10.0000 mg | INTRAMUSCULAR | Status: DC | PRN
  Administered 2020-10-18: 10 mg via INTRAVENOUS
  Filled 2020-10-18 (×2): qty 1

## 2020-10-18 MED ORDER — INSULIN ASPART 100 UNIT/ML ~~LOC~~ SOLN: 0.0000 [IU] | Freq: Every day | SUBCUTANEOUS | Status: DC

## 2020-10-18 MED ORDER — CLONIDINE HCL 0.1 MG PO TABS
0.1000 mg | ORAL_TABLET | Freq: Three times a day (TID) | ORAL | Status: DC | PRN
  Administered 2020-10-18: 0.1 mg via ORAL
  Filled 2020-10-18: qty 1

## 2020-10-18 MED ORDER — INSULIN ASPART 100 UNIT/ML ~~LOC~~ SOLN
0.0000 [IU] | Freq: Three times a day (TID) | SUBCUTANEOUS | Status: DC
  Administered 2020-10-18: 7 [IU] via SUBCUTANEOUS
  Administered 2020-10-19 (×2): 3 [IU] via SUBCUTANEOUS
  Administered 2020-10-20: 4 [IU] via SUBCUTANEOUS

## 2020-10-18 NOTE — Progress Notes (Signed)
eLink Physician-Brief Progress Note Patient Name: Tommy Perez DOB: Jul 16, 1966 MRN: [id number]   Date of Service  10/18/2020  HPI/Events of Note  Hypertension - BP = 182/94.  eICU Interventions  Plan: 1. Hydralazine 10 mg IV Q 4 hours PRN  SBP > 170 or DBP > 100.      Intervention Category Major Interventions: Hypertension - evaluation and management  Tesha Archambeau Dennard Nip 10/18/2020, 5:49 AM

## 2020-10-18 NOTE — Progress Notes (Signed)
eLink Physician-Brief Progress Note Patient Name: Tommy Perez DOB: [birth year] MRN: [id number]   Date of Service  10/18/2020  HPI/Events of Note  Hypertension - BP + 173/96.  eICU Interventions  Plan: 1. Catapres 0.1 mg PO Q 8 hours PRN SBP > 160 or DBP > 100.      Intervention Category Major Interventions: Hypertension - evaluation and management  Riham Polyakov Eugene 10/18/2020, 1:46 AM

## 2020-10-18 NOTE — Progress Notes (Addendum)
NAME:  Tommy Perez, MRN:  [id number], DOB:  05/24/1966, LOS: 67 ADMISSION DATE:  09/30/2020, CONSULTATION DATE: 09/29/2020 REFERRING MD: Dr. Lorin Mercy, [facility] CHIEF COMPLAINT: Status post cardiac arrest  Brief History:  55 yo male smoker admitted to [facility] 2/09 with Rt ACA CVA and HTN emergency with acute diastolic CHF.  He was to be d/c to CIR, but opted to go home.  he presented to ER on 2/16 with garbled speech and leg weakness.  Developed bradycardia leading to PEA arrest on 2/16 with ROSC after 21 minutes.  Found to have aspiration pneumonitis.  Past Medical History:  HTN, DM type 2  Significant Hospital Events:  2/09 Admit to [facility] 2/15 d/c home 2/16 admit to [facility], PEA arrest from aspiration pneumonitis  2/25: Last night patient became hypoxic, tachypneic requiring BiPAP throughout the night with improvement in oxygen saturation and work of breathing 2/24: awake and following commands, appears comfortable on vent today. sbt in progress on 10/8 will attempt to lower settings and then hoepful extubation today. TOC working on dispo as well with family.  2/23: tolerating sbt but with increased ps up to 14. Will cont to try to decrease this. Yesterday had issue with hypoxemia that req increased peep and fio2 but improved today. Possible that we will be able to wean and then extubate in next 24 hours hopefully once family has clear plan delineated   2/22: pt did not tolerate sbt this am. Awake and following commands, visiting with family. Secretions still persist, perhaps a bit improved. Renal function stable. Updated family and him that we need a clear plan once able to be extubated  If he deteriorates 1. reintubate and trach or 2. Transition to comfort 2/21: copious secretions. Awake and following commands. S/p bronch yesterday with some improvement of RLL ? If he will be able to maintain clearance s/p intubation/weakness and recent cva.    Consults:    Significant Diagnostic Tests:   CT head  2/16 >> atrophy, periventricular small vessel disease, prior infarcts at several sites  Abd u/s 2/17 >> cholelithiasis w/o acute cholecystitis, b/l effusions Rt > Lt, trace ascites, medical renal disease  Bronchoscopy 2/20 >>   Micro Data:  MRSA PCR 2/16 >> negative Sputum 2/17 >> candida BAL 2/20 >> 60K yeast  Antimicrobials:  Levaquin 2/17 >> 2/23 eraxis 2/22->2/24 Subjective/Interim history:  Patient continued to require high flow nasal cannula oxygen alternating with the BiPAP. His serum creatinine remain elevated with no improvement  Objective   Blood pressure (!) 172/86, pulse 79, temperature (!) 97 F (36.1 C), temperature source Oral, resp. rate 15, weight 68.4 kg, SpO2 95 %.    FiO2 (%):  [60 %-80 %] 60 %   Intake/Output Summary (Last 24 hours) at 10/18/2020 1036 Last data filed at 10/18/2020 0800 Gross per 24 hour  Intake 47.64 ml  Output 1525 ml  Net -1477.36 ml   Filed Weights   10/14/20 0500 10/15/20 0500 10/16/20 0425  Weight: 69.2 kg 68.6 kg 68.4 kg    Examination:  General -middle-aged African-American male, lying on the bed, currently on high flow nasal cannula HEENT: Atraumatic, normocephalic, dry mucous membranes, no JVD Cardiac -regular rate and rhythm, no murmur/gallop Chest -bilateral crackles, absent air entry at the bases bilaterally, no wheezes Abdomen - soft, mildly tender, + bowel sounds Extremities - no cyanosis, clubbing, or edema, poor hygiene of feet Skin - scaling b/l le Neuro -alert, awake, following commands, moving all 4 extremities  Resolved problems:  PEA  cardiac arrest  Assessment & Plan:   Acute hypoxic respiratory failure from aspiration pneumonitis, acute pulmonary edema, b/l large transudate pleural effusions. Mucus plugging of RML and RLL. Tobacco abuse.  Patient has been requiring BiPAP alternating with high flow nasal cannula oxygen, unable to titrate down I think intravascularly he is dry that led to worsening serum  creatinine with aggressive diuresis No improvement with 25% albumin Continue to hold diuresis Monitor intake and output nicotine patch  Acute on chronic diastolic CHF. Patient is still looks volume overloaded with third spacing but intravascularly he is dry Holding diuresis now Monitor intake output and daily weight Continue Coreg and hydralazine  Acute metabolic encephalopathy after cardiac arrest with recent CVA. Mental status has improved Continue aspirin and Plavix  AKI on CKD 3b. Likely due to cardiorenal syndrome and aggressive diuresis Patient serum creatinine continue to trend up now is 3.7 Holding diuretic Follow BMP monitor urine outpt  Hypernatremia Liley due to aggressive diuresis Closely monitor  Severe protein calorie malnutrition. Dietitian follow-up  DM type 2 poorly controlled with hyperglycemia. Continue sliding scale Holding home meds jardiance, metformin  Best practice (evaluated daily)  Diet: Level 1 dysphagia diet DVT prophylaxis: Lovenox GI prophylaxis: Protonix Mobility: As tolerated Disposition: ICU   Goals of Care:  Code Status: Full code  Labs    CMP Latest Ref Rng & Units 10/18/2020 10/17/2020 10/16/2020  Glucose 70 - 99 mg/dL 182(H) 130(H) 96  BUN 6 - 20 mg/dL 93(H) 87(H) 62(H)  Creatinine 0.61 - 1.24 mg/dL 3.75(H) 3.76(H) 3.23(H)  Sodium 135 - 145 mmol/L 153(H) 149(H) 147(H)  Potassium 3.5 - 5.1 mmol/L 4.7 5.8(H) 4.8  Chloride 98 - 111 mmol/L 112(H) 112(H) 113(H)  CO2 22 - 32 mmol/L _0 Calcium 8.9 - 10.3 mg/dL 7.6(L) 7.9(L) 8.4(L)  Total Protein 6.5 - 8.1 g/dL - 6.6 6.3(L)  Total Bilirubin 0.3 - 1.2 mg/dL - 0.9 1.0  Alkaline Phos 38 - 126 U/L - 413(H) 443(H)  AST 15 - 41 U/L - 46(H) 85(H)  ALT 0 - 44 U/L - 63(H) 83(H)    CBC Latest Ref Rng & Units 10/17/2020 10/16/2020 10/15/2020  WBC 4.0 - 10.5 K/uL 16.2(H) 11.3(H) 10.6(H)  Hemoglobin 13.0 - 17.0 g/dL 10.5(L) 10.8(L) 9.7(L)  Hematocrit 39.0 - 52.0 % 34.4(L) 33.9(L)  30.6(L)  Platelets 150 - 400 K/uL 383 298 282    ABG    Component Value Date/Time   PHART 7.326 (L) 10/08/2020 0010   PCO2ART 42.4 10/08/2020 0010   PO2ART 295 (H) 10/08/2020 0010   HCO3 24.0 10/08/2020 0010   TCO2 26 10/08/2020 0010   ACIDBASEDEF 4.0 (H) 10/08/2020 0010   O2SAT 100.0 10/08/2020 0010    CBG (last 3)  Recent Labs    10/17/20 2325 10/18/20 0431 10/18/20 0747  GLUCAP 210* 179* 141*    Total critical care time: 37 minutes  Performed by: [place] care time was exclusive of separately billable procedures and treating other patients.   Critical care was necessary to treat or prevent imminent or life-threatening deterioration.   Critical care was time spent personally by me on the following activities: development of treatment plan with patient and/or surrogate as well as nursing, discussions with consultants, evaluation of patient's response to treatment, examination of patient, obtaining history from patient or surrogate, ordering and performing treatments and interventions, ordering and review of laboratory studies, ordering and review of radiographic studies, pulse oximetry and re-evaluation of patient's condition.   Merle Whitehorn  Yanky Vanderburg MD [place] [facility] See Amion for pager If no response to pager, please call [phone] until 7pm After 7pm, Please call E-link [phone]

## 2020-10-18 NOTE — Progress Notes (Signed)
  Speech Language Pathology Treatment: Dysphagia  Patient Details Name: Tommy Perez MRN: [id number] DOB: 1966-05-23 Today's Date: 10/18/2020 Time: 6979-4801 SLP Time Calculation (min) (ACUTE ONLY): 8 min  Assessment / Plan / Recommendation Clinical Impression  Pt was seen for skilled ST targeting diet tolerance post MBS.  Pt was encountered awake/alert in bed and he was agreeable to this tx session.  Pt and RN reported that he had been consuming purees and nectar-thick liquid (NTL) without difficulty.  Pt was seen with minimal trials of NTL and puree.  He tolerated puree without difficulty, but exhibited wet vocal quality following 1/2 trials of NTL.  He was able to clear vocal quality with a cued throat clear, and no additional clinical s/sx of aspiration were observed.  Pt politely refused additional PO trials on this date.  Recommend continuation of Dysphagia 1 (puree) solids and nectar-thick liquids with medications administered crushed in puree with supervision and monitoring of diet tolerance.  Pt may benefit from clearing his throat intermittently during PO intake.  Thickener was ordered to the pt's room.  SLP will f/u to monitor diet tolerance per POC.    HPI HPI: Patient is a 55 y.o. male with PMH: HTN, DM-2, tobacco use, who had been admitted to [facility] on 2/9 with Right sided CVA and HTN emergency with acute diastolic CHF and was to be discharged to CIR but opted to go home. He presented to ER on 2/16 with garbled speech and leg weakness, developed bradycardia leading to PEA arrest on 2/16 with ROSC after 21 minutes; he was found to have aspiration pneumonitis. He was intubated on 2/16 and self-extubated on 12/24.      SLP Plan  Continue with current plan of care       Recommendations  Diet recommendations: Nectar-thick liquid;Dysphagia 1 (puree) Liquids provided via: Cup;Straw Medication Administration: Crushed with puree Supervision: Staff to assist with self feeding;Full  supervision/cueing for compensatory strategies Compensations: Minimize environmental distractions;Slow rate;Small sips/bites;Clear throat intermittently;Multiple dry swallows after each bite/sip Postural Changes and/or Swallow Maneuvers: Seated upright 90 degrees                Plan: Continue with current plan of care       GO               Villa Herb., M.S., CCC-SLP Acute Rehabilitation Services Office: [phone]  Shanon Rosser [facility] 10/18/2020, 9:43 AM

## 2020-10-19 ENCOUNTER — Inpatient Hospital Stay (HOSPITAL_COMMUNITY): Payer: 59

## 2020-10-19 ENCOUNTER — Other Ambulatory Visit: Payer: Self-pay | Admitting: *Deleted

## 2020-10-19 DIAGNOSIS — R262 Difficulty in walking, not elsewhere classified: Secondary | ICD-10-CM | POA: Diagnosis not present

## 2020-10-19 LAB — GLUCOSE, CAPILLARY
Glucose-Capillary: 149 mg/dL — ABNORMAL HIGH (ref 70–99)
Glucose-Capillary: 150 mg/dL — ABNORMAL HIGH (ref 70–99)
Glucose-Capillary: 69 mg/dL — ABNORMAL LOW (ref 70–99)
Glucose-Capillary: 161 mg/dL — ABNORMAL HIGH (ref 70–99)

## 2020-10-19 LAB — BASIC METABOLIC PANEL
Anion gap: 11 (ref 5–15)
BUN: 95 mg/dL — ABNORMAL HIGH (ref 6–20)
CO2: 26 mmol/L (ref 22–32)
Calcium: 7.2 mg/dL — ABNORMAL LOW (ref 8.9–10.3)
Chloride: 112 mmol/L — ABNORMAL HIGH (ref 98–111)
Creatinine, Ser: 3.53 mg/dL — ABNORMAL HIGH (ref 0.61–1.24)
GFR, Estimated: 20 mL/min — ABNORMAL LOW (ref >60)
Glucose, Bld: 189 mg/dL — ABNORMAL HIGH (ref 70–99)
Potassium: 4.4 mmol/L (ref 3.5–5.1)
Sodium: 149 mmol/L — ABNORMAL HIGH (ref 135–145)

## 2020-10-19 MED ORDER — ACETYLCYSTEINE 20 % IN SOLN
4.0000 mL | Freq: Three times a day (TID) | RESPIRATORY_TRACT | Status: DC
  Administered 2020-10-19 – 2020-10-20 (×3): 4 mL via RESPIRATORY_TRACT
  Filled 2020-10-19 (×2): qty 4

## 2020-10-19 MED ORDER — ALBUTEROL SULFATE (2.5 MG/3ML) 0.083% IN NEBU
2.5000 mg | INHALATION_SOLUTION | Freq: Three times a day (TID) | RESPIRATORY_TRACT | Status: DC
  Administered 2020-10-19 – 2020-10-20 (×3): 2.5 mg via RESPIRATORY_TRACT
  Filled 2020-10-19 (×2): qty 3

## 2020-10-19 MED ORDER — VANCOMYCIN HCL 750 MG/150ML IV SOLN
750.0000 mg | INTRAVENOUS | Status: DC
  Administered 2020-10-20: 750 mg via INTRAVENOUS
  Filled 2020-10-19: qty 150

## 2020-10-19 MED ORDER — COLLAGENASE 250 UNIT/GM EX OINT
TOPICAL_OINTMENT | Freq: Every day | CUTANEOUS | Status: DC
  Filled 2020-10-19: qty 30

## 2020-10-19 MED ORDER — ALBUTEROL SULFATE (2.5 MG/3ML) 0.083% IN NEBU
INHALATION_SOLUTION | RESPIRATORY_TRACT | Status: AC
  Filled 2020-10-19: qty 3

## 2020-10-19 MED ORDER — SODIUM CHLORIDE 0.9 % IV SOLN
2.0000 g | INTRAVENOUS | Status: DC
  Administered 2020-10-19: 2 g via INTRAVENOUS
  Filled 2020-10-19: qty 2

## 2020-10-19 MED ORDER — VANCOMYCIN HCL 1250 MG/250ML IV SOLN
1250.0000 mg | Freq: Once | INTRAVENOUS | Status: AC
  Administered 2020-10-19: 1250 mg via INTRAVENOUS
  Filled 2020-10-19: qty 250

## 2020-10-19 MED ORDER — SODIUM CHLORIDE 3 % IN NEBU
4.0000 mL | INHALATION_SOLUTION | Freq: Two times a day (BID) | RESPIRATORY_TRACT | Status: DC
  Administered 2020-10-19 – 2020-10-20 (×3): 4 mL via RESPIRATORY_TRACT
  Filled 2020-10-19 (×3): qty 4

## 2020-10-19 MED ORDER — ACETYLCYSTEINE 20 % IN SOLN
4.0000 mL | Freq: Three times a day (TID) | RESPIRATORY_TRACT | Status: DC
  Filled 2020-10-19: qty 4

## 2020-10-19 MED ORDER — FUROSEMIDE 10 MG/ML IJ SOLN
40.0000 mg | Freq: Once | INTRAMUSCULAR | Status: AC
  Administered 2020-10-19: 40 mg via INTRAVENOUS
  Filled 2020-10-19: qty 4

## 2020-10-19 NOTE — Progress Notes (Signed)
Pharmacy Antibiotic Note  Tommy Perez is a 55 y.o. male admitted on 09/22/2020 with pneumonia.  Pharmacy has been consulted for vancomycin and cefepime dosing.  Plan: Vancomycin 1250 mvg IV x 1, then 750 q 24 hrs Cefepime 2g IV q 24 hrs. F/u cultures, renal function and clinical course.   Height: _0  (180.3 cm) Weight: 68.4 kg (150 lb 12.7 oz) IBW/kg (Calculated) : 75.3  Temp (24hrs), Avg:97.9 F (36.6 C), Min:96.1 F (35.6 C), Max:98.9 F (37.2 C)  Recent Labs  Lab 10/13/20 0225 10/14/20 0751 10/15/20 0147 10/16/20 0008 10/17/20 0427 10/18/20 0453 10/19/20 0212  WBC 9.0 7.4 10.6* 11.3* 16.2*  --   --   CREATININE 3.00* 2.91* 2.98* 3.23* 3.76* 3.75* 3.53*    Estimated Creatinine Clearance: 22.9 mL/min (A) (by C-G formula based on SCr of 3.53 mg/dL (H)).    Allergies  Allergen Reactions  . Penicillins Other (See Comments)    Levoflox 2/17>>2/23 Eraxis 2/22>>2/24  2/17 resp candida albicans 2/20 BAL 60K  candida albicans Thank you for allowing pharmacy to be a part of this patient's care.  [place] Crane, Roylene Reason, BCCP Clinical Pharmacist  10/19/2020 6:44 PM   [facility] pharmacy phone numbers are listed on [email or web address]

## 2020-10-19 NOTE — Consult Note (Signed)
WOC Nurse wound follow up Wound type:Pressure (Unstageable) Measurement: 6cm x 7.4cm with central area of eschar measuring 3cm x 2.4cm Wound bed:As described above Drainage (amount, consistency, odor) None Periwound: intact Dressing procedure/placement/frequency: Will add collagenase (Santyl) to central area of eschar today.    WOC nursing team will follow, seeing every 7-10 days and will remain available to this patient, the nursing and medical teams.  Please re-consult if needed between visits. Thanks, Ladona Mow, MSN, RN, GNP, [place]  Pager# [phone]

## 2020-10-19 NOTE — Progress Notes (Signed)
Nutrition Follow-up  DOCUMENTATION CODES:   Not applicable  INTERVENTION:  -Vital Cuisine Shake po TID, each supplement provides 520 kcal and 22 grams of protein -MVI with minerals daily  If PO intake does not improve and if aligned with GOC, recommend placement of NGT/Cortrak for continued nutrition support. Consider: Vital AF 1.2 @ 65 ml/hr with 61ml ProSource TF daily. This would provide 1912 kcals, 128 g of protein and 1264 mL of free water  NUTRITION DIAGNOSIS:   Inadequate oral intake related to acute illness as evidenced by NPO status. -- updated, pt now on dysphagia 1 diet with nectar thick liquids  GOAL:   Patient will meet greater than or equal to 90% of their needs -- will address with supplements  MONITOR:   PO intake,Supplement acceptance,Diet advancement,Weight trends,Labs,I & O's  REASON FOR ASSESSMENT:   Consult,Ventilator Enteral/tube feeding initiation and management  ASSESSMENT:   Pt admitted s/p recent CVA with cardiac arrest; found to have aspiration pneumonitis. PMH includes HTN and type 2 DM   2/09 Admit to [facility] with CVA, HTN emergency 2/15 D/C to home 2/16 Admit to [facility], PEA arrest, intubated, OGT placed 2/24 self-extubated; OGT removed 2/26 Diet advanced to Dysphagia 1 with Nectar Thick liquids s/p MBS  [facility] has offered bed pending insurance authorization.   Per CCM, pt continues to have tenuous respiratory status with need for HFNC, but did not need BiPAP overnight. Diuresis being held due to elevated Cr.   Pt sleeping at time of RD visit and did not wake to RD voice. Pt with poor po intake since diet advancement. 10-30% x 2 recorded meals. Noted untouched meal tray at bedside. Will order nectar thick oral nutrition supplement in hopes of increasing pt's kcal/protein intake; however, it pt unable to increase po intake/if aligned with GOC, recommend continuation of nutrition support via NGT/Cortrak. See TF recommendations above.    Admission weight: 76 kg  Current weight: 68.4 kg    UOP: x24 hours Rectal tube: x24 hours  Medications: ss novolog TID w/ meals and bedtime Labs: Na 149 (H), Cr 3.53 (H - down from yesterday) CBGs: 765-465-03  Diet Order:   Diet Order            DIET - DYS 1 Room service appropriate? Yes; Fluid consistency: Nectar Thick  Diet effective now                 EDUCATION NEEDS:   Not appropriate for education at this time  Skin:  Skin Assessment: Skin Integrity Issues: Skin Integrity Issues:: DTI DTI: sacrum  Last BM:  2/28 type 7 via rectal tube  Height:   Ht Readings from Last 1 Encounters:  10/18/20 5\' 11"  (1.803 m)    Weight:   Wt Readings from Last 1 Encounters:  10/16/20 68.4 kg   BMI:  Body mass index is 21.03 kg/m.  Estimated Nutritional Needs:   Kcal:  1900-2100 kcals  Protein:  115-130 g  Fluid:  >/= 1.9 L   10/18/20, MS, RD, LDN RD pager number and weekend/on-call pager number located in [place].

## 2020-10-19 NOTE — Progress Notes (Addendum)
PCCM note  Patient's sats in the upper 80s, appears comfortable with no respiratory distress Bilateral rhonchi on auscultation  Get chest x-ray Start Vanco and cefepime for HAP given elevation in WBC count today Lasix 40 mg x 1 Monitor for intubation needs.  Addendum: CXR reviewed with right hemithorax opacification,. Suspect mucus plug Recommend aggressive chest PT and starting mucomyst nebs.  Discussed code status with cousin at bedside. Family needs more time to decide. He is full code for now  The patient is critically ill with multiple organ system failure and requires high complexity decision making for assessment and support, frequent evaluation and titration of therapies, advanced monitoring, review of radiographic studies and interpretation of complex data.   Critical Care Time devoted to patient care services, exclusive of separately billable procedures, described in this note is 35 minutes.   Chilton Greathouse MD [place] [facility] See Amion for pager  If no response to pager , please call [phone] until 7pm After 7:00 pm call Elink  [phone] 10/19/2020, 6:57 PM

## 2020-10-19 NOTE — Progress Notes (Signed)
NAME:  Tommy Perez, MRN:  [id number], DOB:  03-05-1966, LOS: 60 ADMISSION DATE:  09/28/2020, CONSULTATION DATE: 10/02/2020 REFERRING MD: Dr. Lorin Mercy, [facility] CHIEF COMPLAINT: Status post cardiac arrest  Brief History:  55 yo male smoker admitted to [facility] 2/09 with Rt ACA CVA and HTN emergency with acute diastolic CHF.  He was to be d/c to CIR, but opted to go home.  he presented to ER on 2/16 with garbled speech and leg weakness.  Developed bradycardia leading to PEA arrest on 2/16 with ROSC after 21 minutes.  Found to have aspiration pneumonitis.  Past Medical History:  HTN, DM type 2  Significant Hospital Events:  2/09 Admit to [facility] 2/15 d/c home 2/16 admit to [facility], PEA arrest from aspiration pneumonitis 2/21: copious secretions. Awake and following commands. S/p bronch yesterday with some improvement of RLL ? If he will be able to maintain clearance s/p intubation/weakness and recent cva.  2/22: pt did not tolerate sbt this am. Awake and following commands, visiting with family. Secretions still persist, perhaps a bit improved. Renal function stable. Updated family and him that we need a clear plan once able to be extubated  If he deteriorates 1. reintubate and trach or 2. Transition to comfort 2/23: tolerating sbt but with increased ps up to 14. Will cont to try to decrease this. Yesterday had issue with hypoxemia that req increased peep and fio2 but improved today. Possible that we will be able to wean and then extubate in next 24 hours hopefully once family has clear plan delineated   2/24: awake and following commands, appears comfortable on vent today. sbt in progress on 10/8 will attempt to lower settings and then hoepful extubation today. TOC working on dispo as well with family.  2/25: Last night patient became hypoxic, tachypneic requiring BiPAP throughout the night with improvement in oxygen saturation and work of breathing   Consults:    Significant Diagnostic Tests:   CT head  2/16 >> atrophy, periventricular small vessel disease, prior infarcts at several sites  Abd u/s 2/17 >> cholelithiasis w/o acute cholecystitis, b/l effusions Rt > Lt, trace ascites, medical renal disease  Bronchoscopy 2/20 >>   Micro Data:  MRSA PCR 2/16 >> negative Sputum 2/17 >> candida BAL 2/20 >> 60K yeast  Antimicrobials:  Levaquin 2/17 >> 2/23 Eraxis 2/22->2/24  Subjective/Interim history:   Continues to have tenuous respiratory status with need for high flow nasal cannula.  Has not needed BiPAP overnight Has poor cough and airway clearance  Objective   Blood pressure (!) 160/82, pulse 93, temperature 98.7 F (37.1 C), temperature source Oral, resp. rate (!) 27, height _0  (1.803 m), weight 68.4 kg, SpO2 (!) 87 %.        Intake/Output Summary (Last 24 hours) at 10/19/2020 0818 Last data filed at 10/19/2020 0753 Gross per 24 hour  Intake 660 ml  Output 1475 ml  Net -815 ml   Filed Weights   10/14/20 0500 10/15/20 0500 10/16/20 0425  Weight: 69.2 kg 68.6 kg 68.4 kg    Examination: Gen:      No acute distress, chronically ill appearing HEENT:  EOMI, sclera anicteric Neck:     No masses; no thyromegaly Lungs:    Bilateral crackles; normal respiratory effort CV:         Regular rate and rhythm; no murmurs Abd:      + bowel sounds; soft, non-tender; no palpable masses, no distension Ext:    No edema; adequate peripheral perfusion,  chronic stasis dermatitis of the legs Skin:      Warm and dry; no rash Neuro: Awake, responsive  Labs/imaging personally reviewed Significant for creatinine 3.53, WBC 16.2 No new imaging  Resolved problems:  PEA cardiac arrest  Assessment & Plan:   Acute hypoxic respiratory failure from aspiration pneumonitis, acute pulmonary edema, b/l large transudate pleural effusions. Mucus plugging of RML and RLL. Tobacco abuse. Continue high flow nasal cannula, wean down as tolerated Continue chest PT, flutter valve Add hypertonic saline  to help with mucociliary clearance.  May need Mucomyst  Acute on chronic diastolic CHF. Volume overload with intravascular depletion Holding diuresis as he has elevated creatinine Continue Coreg and hydralazine.  Acute metabolic encephalopathy after cardiac arrest with recent CVA. Has been more alert.  Continue monitoring Continue aspirin and Plavix  AKI on CKD 3b. Likely due to cardiorenal syndrome and aggressive diuresis Creatinine has been stable.  Holding diuretics Follow labs and urine output  Hypernatremia > improving Liley due to aggressive diuresis Closely monitor  Severe protein calorie malnutrition. Dietitian follow-up  DM type 2 poorly controlled with hyperglycemia. Continue sliding scale Holding home meds jardiance, metformin  Best practice (evaluated daily)  Diet: Level 1 dysphagia diet DVT prophylaxis: Lovenox GI prophylaxis: Protonix Mobility: As tolerated Disposition: ICU   Goals of Care:  Code Status: Full code  Critical care time:   The patient is critically ill with multiple organ system failure and requires high complexity decision making for assessment and support, frequent evaluation and titration of therapies, advanced monitoring, review of radiographic studies and interpretation of complex data.   Critical Care Time devoted to patient care services, exclusive of separately billable procedures, described in this note is 45 minutes.   Marshell Garfinkel MD [place] [facility] See Amion for pager  If no response to pager , please call [phone] until 7pm After 7:00 pm call Elink  [phone] 10/19/2020, 8:18 AM

## 2020-10-19 NOTE — Patient Outreach (Signed)
[facility]) Care Management  10/19/2020  Tommy Perez 23-Apr-1966 [id number]   Case Closure  Pt remains inpt status since 2/16 (12 days). Pt no longer meets [facility] criteria for case management services.  Elliot Cousin, RN Care Management Coordinator [facility] Main Office [phone]

## 2020-10-20 ENCOUNTER — Inpatient Hospital Stay (HOSPITAL_COMMUNITY): Payer: 59

## 2020-10-20 DIAGNOSIS — J90 Pleural effusion, not elsewhere classified: Secondary | ICD-10-CM | POA: Diagnosis not present

## 2020-10-20 DIAGNOSIS — I69391 Dysphagia following cerebral infarction: Secondary | ICD-10-CM

## 2020-10-20 DIAGNOSIS — I5031 Acute diastolic (congestive) heart failure: Secondary | ICD-10-CM

## 2020-10-20 DIAGNOSIS — Z7189 Other specified counseling: Secondary | ICD-10-CM | POA: Diagnosis not present

## 2020-10-20 DIAGNOSIS — Z515 Encounter for palliative care: Secondary | ICD-10-CM

## 2020-10-20 DIAGNOSIS — J9602 Acute respiratory failure with hypercapnia: Secondary | ICD-10-CM

## 2020-10-20 DIAGNOSIS — R262 Difficulty in walking, not elsewhere classified: Secondary | ICD-10-CM | POA: Diagnosis not present

## 2020-10-20 DIAGNOSIS — J9601 Acute respiratory failure with hypoxia: Secondary | ICD-10-CM | POA: Diagnosis not present

## 2020-10-20 DIAGNOSIS — I63521 Cerebral infarction due to unspecified occlusion or stenosis of right anterior cerebral artery: Secondary | ICD-10-CM | POA: Diagnosis not present

## 2020-10-20 LAB — POCT I-STAT 7, (LYTES, BLD GAS, ICA,H+H)
Acid-Base Excess: 0 mmol/L (ref 0.0–2.0)
Acid-Base Excess: 0 mmol/L (ref 0.0–2.0)
Acid-Base Excess: 2 mmol/L (ref 0.0–2.0)
Bicarbonate: 29.7 mmol/L — ABNORMAL HIGH (ref 20.0–28.0)
Bicarbonate: 26.5 mmol/L (ref 20.0–28.0)
Bicarbonate: 26.5 mmol/L (ref 20.0–28.0)
Calcium, Ion: 1.07 mmol/L — ABNORMAL LOW (ref 1.15–1.40)
Calcium, Ion: 1.06 mmol/L — ABNORMAL LOW (ref 1.15–1.40)
Calcium, Ion: 1.02 mmol/L — ABNORMAL LOW (ref 1.15–1.40)
HCT: 33 % — ABNORMAL LOW (ref 39.0–52.0)
HCT: 30 % — ABNORMAL LOW (ref 39.0–52.0)
HCT: 28 % — ABNORMAL LOW (ref 39.0–52.0)
Hemoglobin: 11.2 g/dL — ABNORMAL LOW (ref 13.0–17.0)
Hemoglobin: 10.2 g/dL — ABNORMAL LOW (ref 13.0–17.0)
Hemoglobin: 9.5 g/dL — ABNORMAL LOW (ref 13.0–17.0)
O2 Saturation: 100 %
O2 Saturation: 83 %
O2 Saturation: 84 %
Patient temperature: 98.6
Potassium: 4 mmol/L (ref 3.5–5.1)
Potassium: 4.2 mmol/L (ref 3.5–5.1)
Potassium: 4.1 mmol/L (ref 3.5–5.1)
Sodium: 153 mmol/L — ABNORMAL HIGH (ref 135–145)
Sodium: 153 mmol/L — ABNORMAL HIGH (ref 135–145)
Sodium: 154 mmol/L — ABNORMAL HIGH (ref 135–145)
TCO2: 32 mmol/L (ref 22–32)
TCO2: 28 mmol/L (ref 22–32)
TCO2: 28 mmol/L (ref 22–32)
pCO2 arterial: 77 mmHg (ref 32.0–48.0)
pCO2 arterial: 51.8 mmHg — ABNORMAL HIGH (ref 32.0–48.0)
pCO2 arterial: 39.6 mmHg (ref 32.0–48.0)
pH, Arterial: 7.195 — CL (ref 7.350–7.450)
pH, Arterial: 7.317 — ABNORMAL LOW (ref 7.350–7.450)
pH, Arterial: 7.434 (ref 7.350–7.450)
pO2, Arterial: 287 mmHg — ABNORMAL HIGH (ref 83.0–108.0)
pO2, Arterial: 52 mmHg — ABNORMAL LOW (ref 83.0–108.0)
pO2, Arterial: 47 mmHg — ABNORMAL LOW (ref 83.0–108.0)

## 2020-10-20 LAB — BODY FLUID CELL COUNT WITH DIFFERENTIAL
Lymphs, Fluid: 26 %
Monocyte-Macrophage-Serous Fluid: 14 % — ABNORMAL LOW (ref 50–90)
Neutrophil Count, Fluid: 60 % — ABNORMAL HIGH (ref 0–25)
Total Nucleated Cell Count, Fluid: 388 cu mm (ref 0–1000)

## 2020-10-20 LAB — LACTATE DEHYDROGENASE: LDH: 283 U/L — ABNORMAL HIGH (ref 98–192)

## 2020-10-20 LAB — CBC
HCT: 33.7 % — ABNORMAL LOW (ref 39.0–52.0)
Hemoglobin: 10.7 g/dL — ABNORMAL LOW (ref 13.0–17.0)
MCH: 27.1 pg (ref 26.0–34.0)
MCHC: 31.8 g/dL (ref 30.0–36.0)
MCV: 85.3 fL (ref 80.0–100.0)
Platelets: 357 10*3/uL (ref 150–400)
RBC: 3.95 MIL/uL — ABNORMAL LOW (ref 4.22–5.81)
RDW: 15.9 % — ABNORMAL HIGH (ref 11.5–15.5)
WBC: 25.8 10*3/uL — ABNORMAL HIGH (ref 4.0–10.5)
nRBC: 0 % (ref 0.0–0.2)

## 2020-10-20 LAB — BASIC METABOLIC PANEL
Anion gap: 13 (ref 5–15)
BUN: 97 mg/dL — ABNORMAL HIGH (ref 6–20)
CO2: 24 mmol/L (ref 22–32)
Calcium: 7.3 mg/dL — ABNORMAL LOW (ref 8.9–10.3)
Chloride: 113 mmol/L — ABNORMAL HIGH (ref 98–111)
Creatinine, Ser: 3.61 mg/dL — ABNORMAL HIGH (ref 0.61–1.24)
GFR, Estimated: 19 mL/min — ABNORMAL LOW (ref >60)
Glucose, Bld: 144 mg/dL — ABNORMAL HIGH (ref 70–99)
Potassium: 4 mmol/L (ref 3.5–5.1)
Sodium: 150 mmol/L — ABNORMAL HIGH (ref 135–145)

## 2020-10-20 LAB — PROTEIN, TOTAL: Total Protein: 6.2 g/dL — ABNORMAL LOW (ref 6.5–8.1)

## 2020-10-20 LAB — ECHOCARDIOGRAM COMPLETE
AR max vel: 3.24 cm2
AV Area VTI: 3.64 cm2
AV Area mean vel: 3.14 cm2
AV Mean grad: 2 mmHg
AV Peak grad: 4.1 mmHg
Ao pk vel: 1.01 m/s
Area-P 1/2: 3.72 cm2
Height: 71 in
MV VTI: 2.37 cm2
S' Lateral: 2.3 cm
Weight: 2412.71 oz

## 2020-10-20 LAB — PROTEIN, PLEURAL OR PERITONEAL FLUID: Total protein, fluid: <3 g/dL

## 2020-10-20 LAB — GLUCOSE, CAPILLARY
Glucose-Capillary: 151 mg/dL — ABNORMAL HIGH (ref 70–99)
Glucose-Capillary: 118 mg/dL — ABNORMAL HIGH (ref 70–99)
Glucose-Capillary: 87 mg/dL (ref 70–99)

## 2020-10-20 LAB — TRIGLYCERIDES: Triglycerides: 125 mg/dL (ref <150)

## 2020-10-20 LAB — GLUCOSE, PLEURAL OR PERITONEAL FLUID: Glucose, Fluid: 156 mg/dL

## 2020-10-20 LAB — MAGNESIUM: Magnesium: 2.3 mg/dL (ref 1.7–2.4)

## 2020-10-20 LAB — LACTATE DEHYDROGENASE, PLEURAL OR PERITONEAL FLUID: LD, Fluid: 161 U/L — ABNORMAL HIGH (ref 3–23)

## 2020-10-20 LAB — PHOSPHORUS: Phosphorus: 5.4 mg/dL — ABNORMAL HIGH (ref 2.5–4.6)

## 2020-10-20 MED ORDER — ETOMIDATE 2 MG/ML IV SOLN: 20.0000 mg | Freq: Once | INTRAVENOUS | Status: AC

## 2020-10-20 MED ORDER — NOREPINEPHRINE 4 MG/250ML-% IV SOLN: 2.0000 ug/min | INTRAVENOUS | Status: DC

## 2020-10-20 MED ORDER — ETOMIDATE 2 MG/ML IV SOLN
INTRAVENOUS | Status: AC
  Administered 2020-10-20: 20 mg via INTRAVENOUS
  Filled 2020-10-20: qty 20

## 2020-10-20 MED ORDER — POLYETHYLENE GLYCOL 3350 17 G PO PACK: 17.0000 g | PACK | Freq: Every day | ORAL | Status: DC

## 2020-10-20 MED ORDER — PHENYLEPHRINE 40 MCG/ML (10ML) SYRINGE FOR IV PUSH (FOR BLOOD PRESSURE SUPPORT)
PREFILLED_SYRINGE | INTRAVENOUS | Status: AC
  Filled 2020-10-20: qty 10

## 2020-10-20 MED ORDER — FENTANYL CITRATE (PF) 100 MCG/2ML IJ SOLN: 50.0000 ug | INTRAMUSCULAR | Status: DC | PRN

## 2020-10-20 MED ORDER — SODIUM BICARBONATE 8.4 % IV SOLN
INTRAVENOUS | Status: AC
  Filled 2020-10-20: qty 50

## 2020-10-20 MED ORDER — ASPIRIN 81 MG PO CHEW
81.0000 mg | CHEWABLE_TABLET | Freq: Every day | ORAL | Status: DC
Start: 2020-10-21 — End: 2020-10-21

## 2020-10-20 MED ORDER — ETOMIDATE 2 MG/ML IV SOLN
INTRAVENOUS | Status: AC
  Filled 2020-10-20: qty 20

## 2020-10-20 MED ORDER — SODIUM CHLORIDE 0.9 % IV SOLN: 250.0000 mL | INTRAVENOUS | Status: DC

## 2020-10-20 MED ORDER — ROCURONIUM BROMIDE 50 MG/5ML IV SOLN
60.0000 mg | Freq: Once | INTRAVENOUS | Status: AC
  Administered 2020-10-20: 60 mg via INTRAVENOUS
  Filled 2020-10-20: qty 6

## 2020-10-20 MED ORDER — ADULT MULTIVITAMIN W/MINERALS CH: 1.0000 | ORAL_TABLET | Freq: Every day | ORAL | Status: DC

## 2020-10-20 MED ORDER — DEXMEDETOMIDINE HCL IN NACL 400 MCG/100ML IV SOLN: 0.0000 ug/kg/h | INTRAVENOUS | Status: DC

## 2020-10-20 MED ORDER — ALBUMIN HUMAN 25 % IV SOLN
25.0000 g | Freq: Once | INTRAVENOUS | Status: AC
  Administered 2020-10-20: 25 g via INTRAVENOUS
  Filled 2020-10-20: qty 100

## 2020-10-20 MED ORDER — FENTANYL CITRATE (PF) 100 MCG/2ML IJ SOLN
INTRAMUSCULAR | Status: AC
  Filled 2020-10-20: qty 2

## 2020-10-20 MED ORDER — DOCUSATE SODIUM 50 MG/5ML PO LIQD: 100.0000 mg | Freq: Two times a day (BID) | ORAL | Status: DC

## 2020-10-20 MED ORDER — ROCURONIUM BROMIDE 10 MG/ML (PF) SYRINGE
PREFILLED_SYRINGE | INTRAVENOUS | Status: AC
  Filled 2020-10-20: qty 10

## 2020-10-20 MED ORDER — MIDAZOLAM HCL 2 MG/2ML IJ SOLN
INTRAMUSCULAR | Status: AC
  Filled 2020-10-20: qty 4

## 2020-10-20 MED ORDER — EPINEPHRINE 1 MG/10ML IJ SOSY
PREFILLED_SYRINGE | INTRAMUSCULAR | Status: AC
  Filled 2020-10-20: qty 10

## 2020-10-20 MED ORDER — ROCURONIUM BROMIDE 10 MG/ML (PF) SYRINGE
PREFILLED_SYRINGE | INTRAVENOUS | Status: AC
  Administered 2020-10-20: 100 mg
  Filled 2020-10-20: qty 10

## 2020-10-20 MED ORDER — NOREPINEPHRINE 4 MG/250ML-% IV SOLN
INTRAVENOUS | Status: AC
  Administered 2020-10-20: 4 mg
  Filled 2020-10-20: qty 250

## 2020-10-20 MED ORDER — FUROSEMIDE 10 MG/ML IJ SOLN
40.0000 mg | Freq: Once | INTRAMUSCULAR | Status: AC
  Administered 2020-10-20: 40 mg via INTRAVENOUS
  Filled 2020-10-20: qty 4

## 2020-10-20 MED ORDER — CLOPIDOGREL BISULFATE 75 MG PO TABS
75.0000 mg | ORAL_TABLET | Freq: Every day | ORAL | Status: DC
Start: 2020-10-21 — End: 2020-10-21

## 2020-10-20 DEATH — deceased

## 2020-10-21 LAB — CYTOLOGY - NON PAP

## 2020-10-23 LAB — BODY FLUID CULTURE W GRAM STAIN: Culture: NO GROWTH

## 2020-11-20 NOTE — Progress Notes (Signed)
SLP Cancellation Note  Patient Details Name: Tommy Perez MRN: [id number] DOB: Aug 05, 1966   Cancelled treatment:       Reason Eval/Treat Not Completed: Medical issues which prohibited therapy; pt currently intubated; will follow for goals of care   Ardyth Gal MA, CCC-SLP Acute Rehabilitation Services   10/26/2020, 3:47 PM

## 2020-11-20 NOTE — Progress Notes (Signed)
Patient has passed with his family at the bedside .Time of death 48 with asystole on monitor and no heart sounds auscultated by myself and Scientist, research (physical sciences). Dr Alvia Grove is aware

## 2020-11-20 NOTE — Progress Notes (Signed)
Held CPT at this time.  Patient just respiratory arrested and had to be intubated.  WIll get patient stable and then do at next round.

## 2020-11-20 NOTE — Progress Notes (Signed)
PCCM note  Patient was stable after intubation Then rapidly developed hypotension. Bedside ultrasound did not show lung sliding on the right We attempted needle compression and emergent chest tube placement on the right but he went into PEA arrest As per the wishes of the family CPR was not performed Epi x2 given with no ROSC 18:12 time of death Family at bedside.  The patient is critically ill with multiple organ system failure and requires high complexity decision making for assessment and support, frequent evaluation and titration of therapies, advanced monitoring, review of radiographic studies and interpretation of complex data.   Critical Care Time devoted to patient care services, exclusive of separately billable procedures, described in this note is 45 minutes.   Chilton Greathouse MD [place] [facility] See Amion for pager  If no response to pager , please call [phone] until 7pm After 7:00 pm call Elink  [phone] 2020-11-12, 6:22 PM

## 2020-11-20 NOTE — Progress Notes (Signed)
Nurse requested chaplain for the family. Met Tommy Perez-patient like a father to him. He was very emotional and needed support. Chaplain provided prayer and emotional support.   10/18/20 1800  Clinical Encounter Type  Visited With Other (Comment) (Tommy Perez-(like a son))  Visit Type Spiritual support  Referral From Nurse  Consult/Referral To Chaplain  Spiritual Encounters  Spiritual Needs Prayer;Grief support  Advance Directives (For Healthcare)  Does Patient Have a Medical Advance Directive? Yes  Does patient want to make changes to medical advance directive? No - Patient declined  Type of Advance Directive [place] in Chart? Yes - validated most recent copy scanned in chart (See row information)  [place]  Does Patient Have a Mental Health Advance Directive? No  Would patient like information on creating a mental health advance directive? No - Patient declined
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
## 2020-11-20 NOTE — Progress Notes (Signed)
  Echocardiogram 2D Echocardiogram has been performed.  Gerda Diss 11/09/2020, 11:18 AM

## 2020-11-20 NOTE — Procedures (Signed)
Insertion of Chest Tube Procedure Note  Jamicah Anstead  [id number]  October 15, 1965  Date:11/13/2020  Time:6:22 PM    Provider Performing: Chilton Greathouse   Procedure: Chest Tube Insertion (32551)  Indication(s) Pneumothorax  Consent Unable to obtain consent due to emergent nature of procedure.  Anesthesia Topical only with 1% lidocaine    Time Out Verified patient identification, verified procedure, site/side was marked, verified correct patient position, special equipment/implants available, medications/allergies/relevant history reviewed, required imaging and test results available.   Sterile Technique Maximal sterile technique was not able to be achieved due to emergent nature of procedure.   Procedure Description Ultrasound not used to identify appropriate pleural anatomy for placement and overlying skin marked. Area of placement cleaned and draped in sterile fashion.  A Wayne catheter was placed into the right pleural space using Seldinger technique. Appropriate return of air was obtained.  The tube was connected to atrium and placed on -20 cm H2O wall suction.     EBL Minimal  Specimen(s) none   Chilton Greathouse MD [facility] See Amion for pager  If no response to pager , please call [phone] until 7pm After 7:00 pm call Elink  [phone] 10/27/2020, 6:23 PM

## 2020-11-20 NOTE — Progress Notes (Addendum)
NAME:  Tommy Perez, MRN:  [id number], DOB:  January 22, 1966, LOS: 73 ADMISSION DATE:  10/09/2020, CONSULTATION DATE: 09/26/2020 REFERRING MD: Dr. Lorin Mercy, [facility] CHIEF COMPLAINT: Status post cardiac arrest  Brief History:  55 yo male smoker admitted to [facility] 2/09 with Rt ACA CVA and HTN emergency with acute diastolic CHF.  He was to be d/c to CIR, but opted to go home.  he presented to ER on 2/16 with garbled speech and leg weakness.  Developed bradycardia leading to PEA arrest on 2/16 with ROSC after 21 minutes.  Found to have aspiration pneumonitis.  Past Medical History:  HTN, DM type 2  Significant Hospital Events:  2/09 Admit to [facility] 2/15 d/c home 2/16 admit to [facility], PEA arrest from aspiration pneumonitis 2/21: copious secretions. Awake and following commands. S/p bronch yesterday with some improvement of RLL ? If he will be able to maintain clearance s/p intubation/weakness and recent cva.  2/22: pt did not tolerate sbt this am. Awake and following commands, visiting with family. Secretions still persist, perhaps a bit improved. Renal function stable. Updated family and him that we need a clear plan once able to be extubated  If he deteriorates 1. reintubate and trach or 2. Transition to comfort 2/23: tolerating sbt but with increased ps up to 14. Will cont to try to decrease this. Yesterday had issue with hypoxemia that req increased peep and fio2 but improved today. Possible that we will be able to wean and then extubate in next 24 hours hopefully once family has clear plan delineated   2/24: awake and following commands, appears comfortable on vent today. sbt in progress on 10/8 will attempt to lower settings and then hoepful extubation today. TOC working on dispo as well with family.  2/25: Hypoxic, tachypneic requiring BiPAP throughout the night with improvement in oxygen saturation and work of breathing 3/1: likely mucous plug on the right with left hemithorax opacification.  Started Mucomyst  nebs, back on BiPAP.  Consults:    Significant Diagnostic Tests:   CT head 2/16 >> atrophy, periventricular small vessel disease, prior infarcts at several sites  Abd u/s 2/17 >> cholelithiasis w/o acute cholecystitis, b/l effusions Rt > Lt, trace ascites, medical renal disease  Bronchoscopy 2/20 >>   Micro Data:  MRSA PCR 2/16 >> negative Sputum 2/17 >> candida BAL 2/20 >> 60K yeast  Antimicrobials:  Levaquin 2/17 >> 2/23 Eraxis 2/22->2/24  Vanco 2/28 >> Cefepime 2/28 >>  Subjective/Interim history:   Back on BiPAP overnight with left lung opacification.  It is opened up a bit today but continues to have significant dyspnea, tenuous respiratory status  Objective   Blood pressure (!) 147/78, pulse 88, temperature 98.2 F (36.8 C), temperature source Axillary, resp. rate (!) 29, height _0  (1.803 m), weight 68.4 kg, SpO2 (!) 88 %.    FiO2 (%):  [100 %] 100 %   Intake/Output Summary (Last 24 hours) at 11/12/20 0905 Last data filed at 11/12/2020 0700 Gross per 24 hour  Intake 340 ml  Output 2100 ml  Net -1760 ml   Filed Weights   10/14/20 0500 10/15/20 0500 10/16/20 0425  Weight: 69.2 kg 68.6 kg 68.4 kg    Examination: Blood pressure (!) 147/78, pulse 88, temperature 98.2 F (36.8 C), temperature source Axillary, resp. rate (!) 29, height _1  (1.803 m), weight 68.4 kg, SpO2 (!) 88 %. Gen:      No acute distress, chronically ill-appearing HEENT:  EOMI, sclera anicteric Neck:  No masses; no thyromegaly Lungs:    Reduced breath sounds CV:         Regular rate and rhythm; no murmurs Abd:      + bowel sounds; soft, non-tender; no palpable masses, no distension Ext:    No edema; adequate peripheral perfusion Skin:      Warm and dry; no rash Neuro: Awake, mild agitation  Labs/imaging personally reviewed Significant for creatinine 3.53, WBC 16.2 No new imaging  Resolved problems:  PEA cardiac arrest  Assessment & Plan:  Acute hypoxic respiratory failure  from aspiration pneumonitis, acute pulmonary edema, b/l large transudate pleural effusions. Recurrent mucus plugging of RML and RLL Tobacco abuse Continue high flow nasal cannula and BiPAP alternating Continue chest PT, flutter valve Added hypertonic saline and Mucomyst to help with mucociliary clearance He appears to be headed towards intubation.  We will plan on right thoracentesis.  Risk-benefit discussed with POA as he is on Plavix and we have decided to proceed If he tolerates it then consider left thoracentesis as well in order to prevent going back on the ventilator Started Zosyn  Acute on chronic diastolic CHF. Volume overload with intravascular depletion He received 1 dose of Lasix yesterday.  We will repeat another dose today Continue Coreg and hydralazine.  Acute metabolic encephalopathy after cardiac arrest with recent CVA. Hold plavix for thoracentesis, continue aspirin  AKI on CKD 3b. Likely due to cardiorenal syndrome and aggressive diuresis Creatinine has been stable.  Follow labs and urine output  Hypernatremia [place] due to diuresis Closely monitor  Severe protein calorie malnutrition. Dietitian follow-up  DM type 2 poorly controlled with hyperglycemia. Continue sliding scale Holding home meds jardiance, metformin  Goals of care Discussed with power of attorney and cousin.  They have requested full code.  If he gets reintubated then he will need a tracheostomy Will consult palliative care  Best practice (evaluated daily)  Diet: Level 1 dysphagia diet DVT prophylaxis: Lovenox GI prophylaxis: Protonix Mobility: As tolerated Disposition: ICU   Goals of Care:  Code Status: Full code  Critical care time:   The patient is critically ill with multiple organ system failure and requires high complexity decision making for assessment and support, frequent evaluation and titration of therapies, advanced monitoring, review of radiographic studies and  interpretation of complex data.   Critical Care Time devoted to patient care services, exclusive of separately billable procedures, described in this note is 45 minutes.   Marshell Garfinkel MD  Pulmonary & Critical care See Amion for pager  If no response to pager , please call [phone] until 7pm After 7:00 pm call Elink  [phone] November 17, 2020, 9:05 AM

## 2020-11-20 NOTE — Progress Notes (Signed)
I called Zipporah Plants and informed him that Mr.  Dubeau stopped breathing and had to be intubated . He is going to come to the bedside

## 2020-11-20 NOTE — Procedures (Signed)
Intubation Procedure Note  Tommy Perez  [id number]  1966/04/12  Date:10/27/2020  Time:4:33 PM   Provider Performing:Reka Wist    Procedure: Intubation (31500)  Indication(s) Respiratory Failure  Consent Unable to obtain consent due to emergent nature of procedure.   Anesthesia Etomidate and Rocuronium   Time Out Verified patient identification, verified procedure, site/side was marked, verified correct patient position, special equipment/implants available, medications/allergies/relevant history reviewed, required imaging and test results available.   Sterile Technique Usual hand hygeine, masks, and gloves were used   Procedure Description Patient positioned in bed supine.  Sedation given as noted above.  Patient was intubated with endotracheal tube using Glidescope.  View was Grade 1 full glottis .  Number of attempts was 1.  Colorimetric CO2 detector was consistent with tracheal placement.   Complications/Tolerance None; patient tolerated the procedure well. Chest X-ray is ordered to verify placement.   EBL Minimal   Specimen(s) None  Chilton Greathouse MD [facility] See Amion for pager  If no response to pager , please call [phone] until 7pm After 7:00 pm call Elink  [phone] 11/11/2020, 4:34 PM

## 2020-11-20 NOTE — Discharge Summary (Signed)
Physician Death Summary  Patient ID: Tommy Perez MRN: [id number] DOB/AGE: 26-Feb-1966 55 y.o.  Admit date: 10/13/2020 Discharge date: 16-Nov-2020  Admission Diagnoses: Cardiac arrest Aspiration pneumonia  Discharge Diagnoses:  Cardiac arrest on admission Aspiration pneumonia Sepsis, septic shock present on admission Acute kidney injury, CKD stage 3a Diastolic heart failure History of stroke Recurrent mucous plugging. Severe protein calorie malnutrition Diabetes mellitus, poorly controlled  Discharged Condition: Deceased  Hospital Course:  55 yo male smoker admitted to [facility] 2/09 with Rt ACA CVA and HTN emergency with acute diastolic CHF.  He was to be d/c to CIR, but opted to go home.  he presented to ER on 2/16 with garbled speech and leg weakness.  Developed bradycardia leading to PEA arrest on 2/16 with ROSC after 21 minutes.  Found to have aspiration pneumonitis.  2/09 Admit to [facility] 2/15 d/c home 2/16 admit to [facility], PEA arrest from aspiration pneumonitis 2/21: copious secretions. Awake and following commands. S/p bronch yesterday with some improvement of RLL ? If he will be able to maintain clearance s/p intubation/weakness and recent cva.  2/22: pt did not tolerate sbt this am. Awake and following commands, visiting with family. Secretions still persist, perhaps a bit improved. Renal function stable. Updated family and him that we need a clear plan once able to be extubated  If he deteriorates 1. reintubate and trach or 2. Transition to comfort 2/23: tolerating sbt but with increased ps up to 14. Will cont to try to decrease this. Yesterday had issue with hypoxemia that req increased peep and fio2 but improved today. Possible that we will be able to wean and then extubate in next 24 hours hopefully once family has clear plan delineated   2/24: awake and following commands, appears comfortable on vent today. sbt in progress on 10/8 will attempt to lower settings and then hoepful  extubation today. TOC working on dispo as well with family.  2/25: Hypoxic, tachypneic requiring BiPAP throughout the night with improvement in oxygen saturation and work of breathing 3/1: likely mucous plug on the right with left hemithorax opacification.  Started Mucomyst nebs, back on BiPAP.  Palliative care met with the family and made him limited code with no CPR On 3/1 he underwent thoracentesis on the right due to worsening respiratory status requiring BiPAP.  He continued to deteriorate through the day and was intubated in the afternoon After intubation he developed sudden hypotension with possible pneumothorax on the right underwent needle decompression and chest tube placement but could not be revived In accordance with the wishes of the family he did not undergo CPR.  Time of death 6:12 PM  Signed: Nicholi Ghuman 2020/11/16, 6:24 PM

## 2020-11-20 NOTE — Procedures (Signed)
Thoracentesis  Procedure Note  Tommy Perez  [id number]  09-20-1965  Date:11/15/20  Time:9:40 AM   Provider Performing:Lennan Malone Judie Petit Pecola Leisure   Procedure: Thoracentesis with imaging guidance (10626)  Indication(s) Pleural Effusion  Consent Risks of the procedure as well as the alternatives and risks of each were explained to the patient and/or caregiver.  Consent for the procedure was obtained and is signed in the bedside chart  Anesthesia Topical only with 1% lidocaine    Time Out Verified patient identification, verified procedure, site/side was marked, verified correct patient position, special equipment/implants available, medications/allergies/relevant history reviewed, required imaging and test results available.   Sterile Technique Maximal sterile technique including full sterile barrier drape, hand hygiene, sterile gloves, mask, sterile ultrasound probe cover (if used).      Procedure Description Ultrasound was used to identify appropriate pleural anatomy for placement and overlying skin marked.  Area of drainage cleaned and draped in sterile fashion. Lidocaine was used to anesthetize the skin and subcutaneous tissue.  1200cc of straw-colored fluid was drained from the right pleural space. Catheter then removed and bandaid applied to site.   Complications/Tolerance Patient became briefly hypotensive/bradycardic at the end of the procedure; this resolved with getting patient supine back into the bed without additional intervention. Chest X-ray is ordered to confirm no post-procedural complication.   EBL Minimal   Specimen(s) Pleural fluid  The entire procedure was directly supervised/proctored by Joneen Roach, NP.  [name] Lair, PA-C [facility] 2020/11/15 9:42 AM  Please see [email or web address] for pager details.

## 2020-11-20 DEATH — deceased

## 2021-09-29 IMAGING — DX DG CHEST 1V PORT
1 series · 1 of 1 positions shown · non-contrast
Comparison: 10/14/2020 at [DATE] a.m.

CLINICAL DATA: Intubated, respiratory failure

EXAM:
PORTABLE CHEST 1 VIEW

[chest ap]
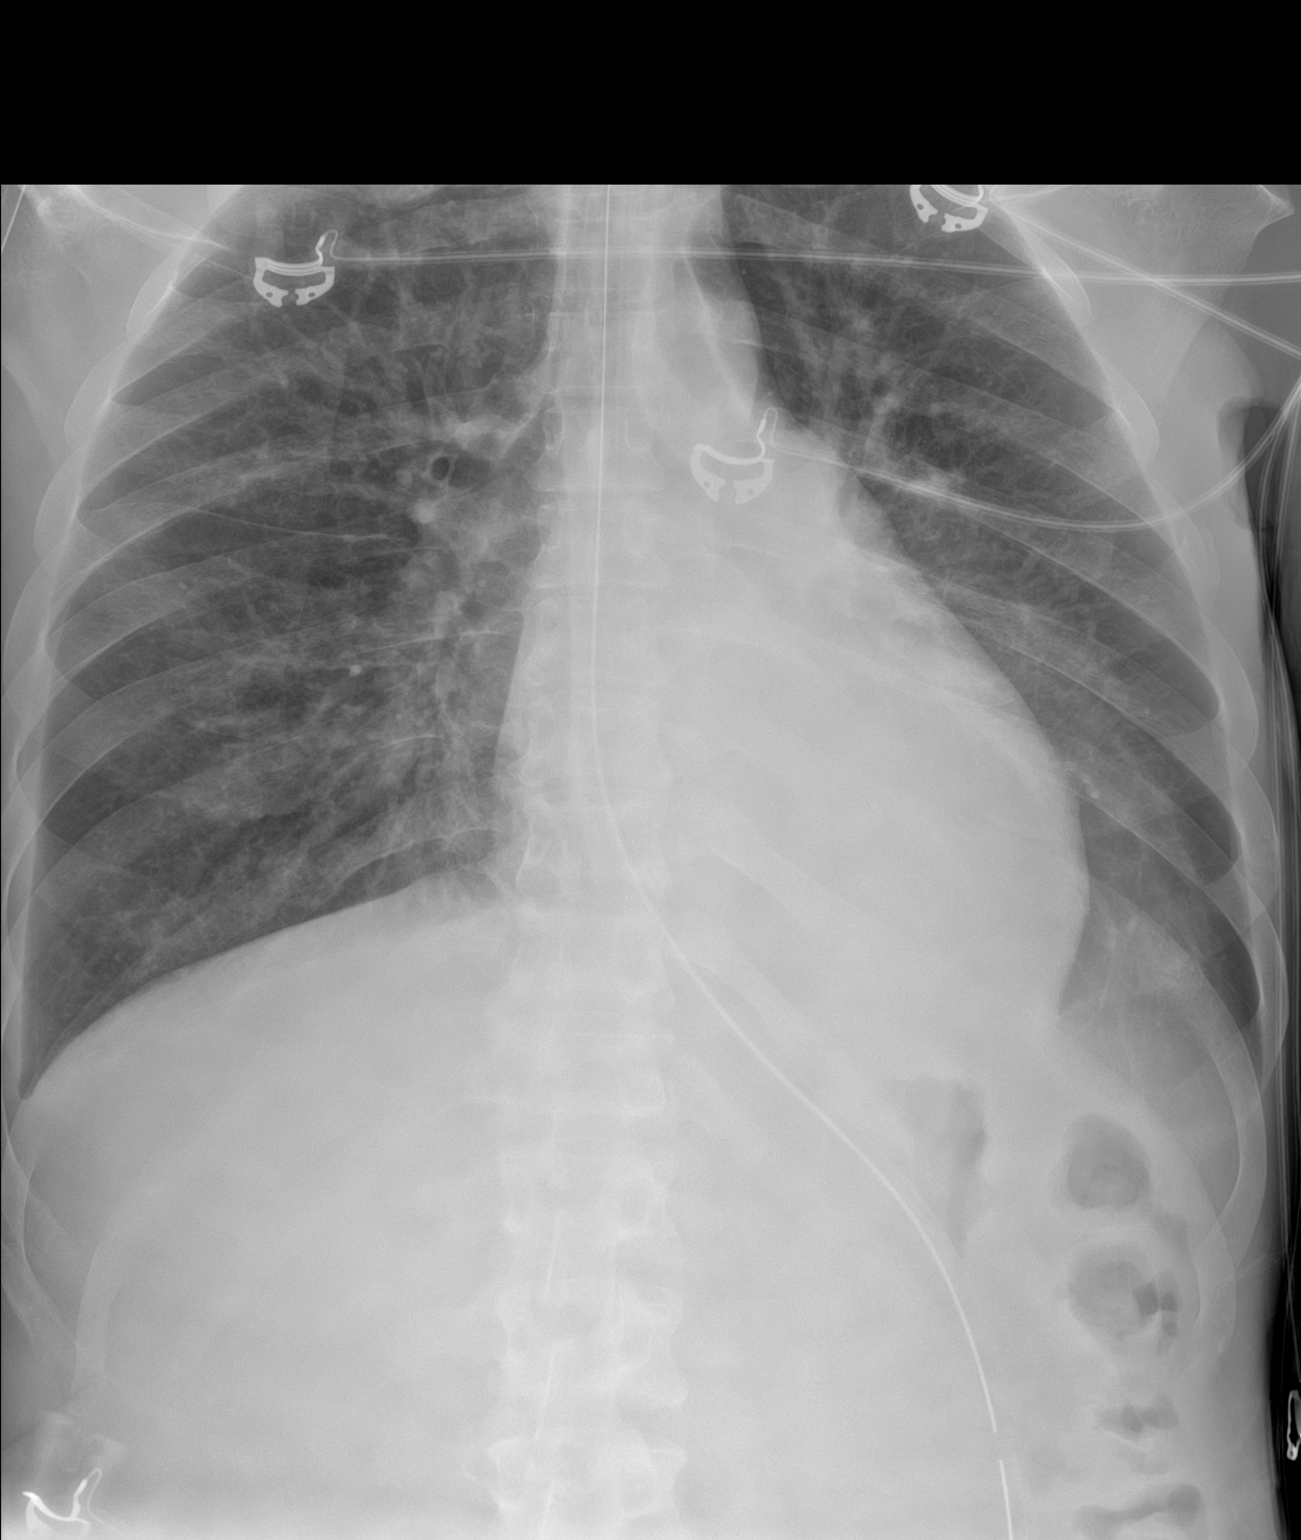

[1 of 1 positions shown; findings below may reference images not displayed]

FINDINGS: Single frontal view of the chest demonstrates endotracheal tube
overlying tracheal air column, tip midway between thoracic inlet and
carina. Enteric catheter passes below diaphragm tip excluded by
collimation with side port projecting over gastric body. Cardiac
silhouette is stable. Veiling opacity left lung base unchanged.
Stable small right pleural effusion. No pneumothorax. No acute bony
abnormalities.
IMPRESSION: 1. Support devices as above, with no evidence of complication after
intubation.
2. Persistent left basilar consolidation and/or effusion. Stable
right pleural effusion.

## 2021-09-30 IMAGING — DX DG CHEST 1V PORT
1 series · 1 of 1 positions shown · non-contrast
Comparison: Radiographs 10/14/2020

CLINICAL DATA: Acute respiratory failure

EXAM:
PORTABLE CHEST 1 VIEW

[chest ap]
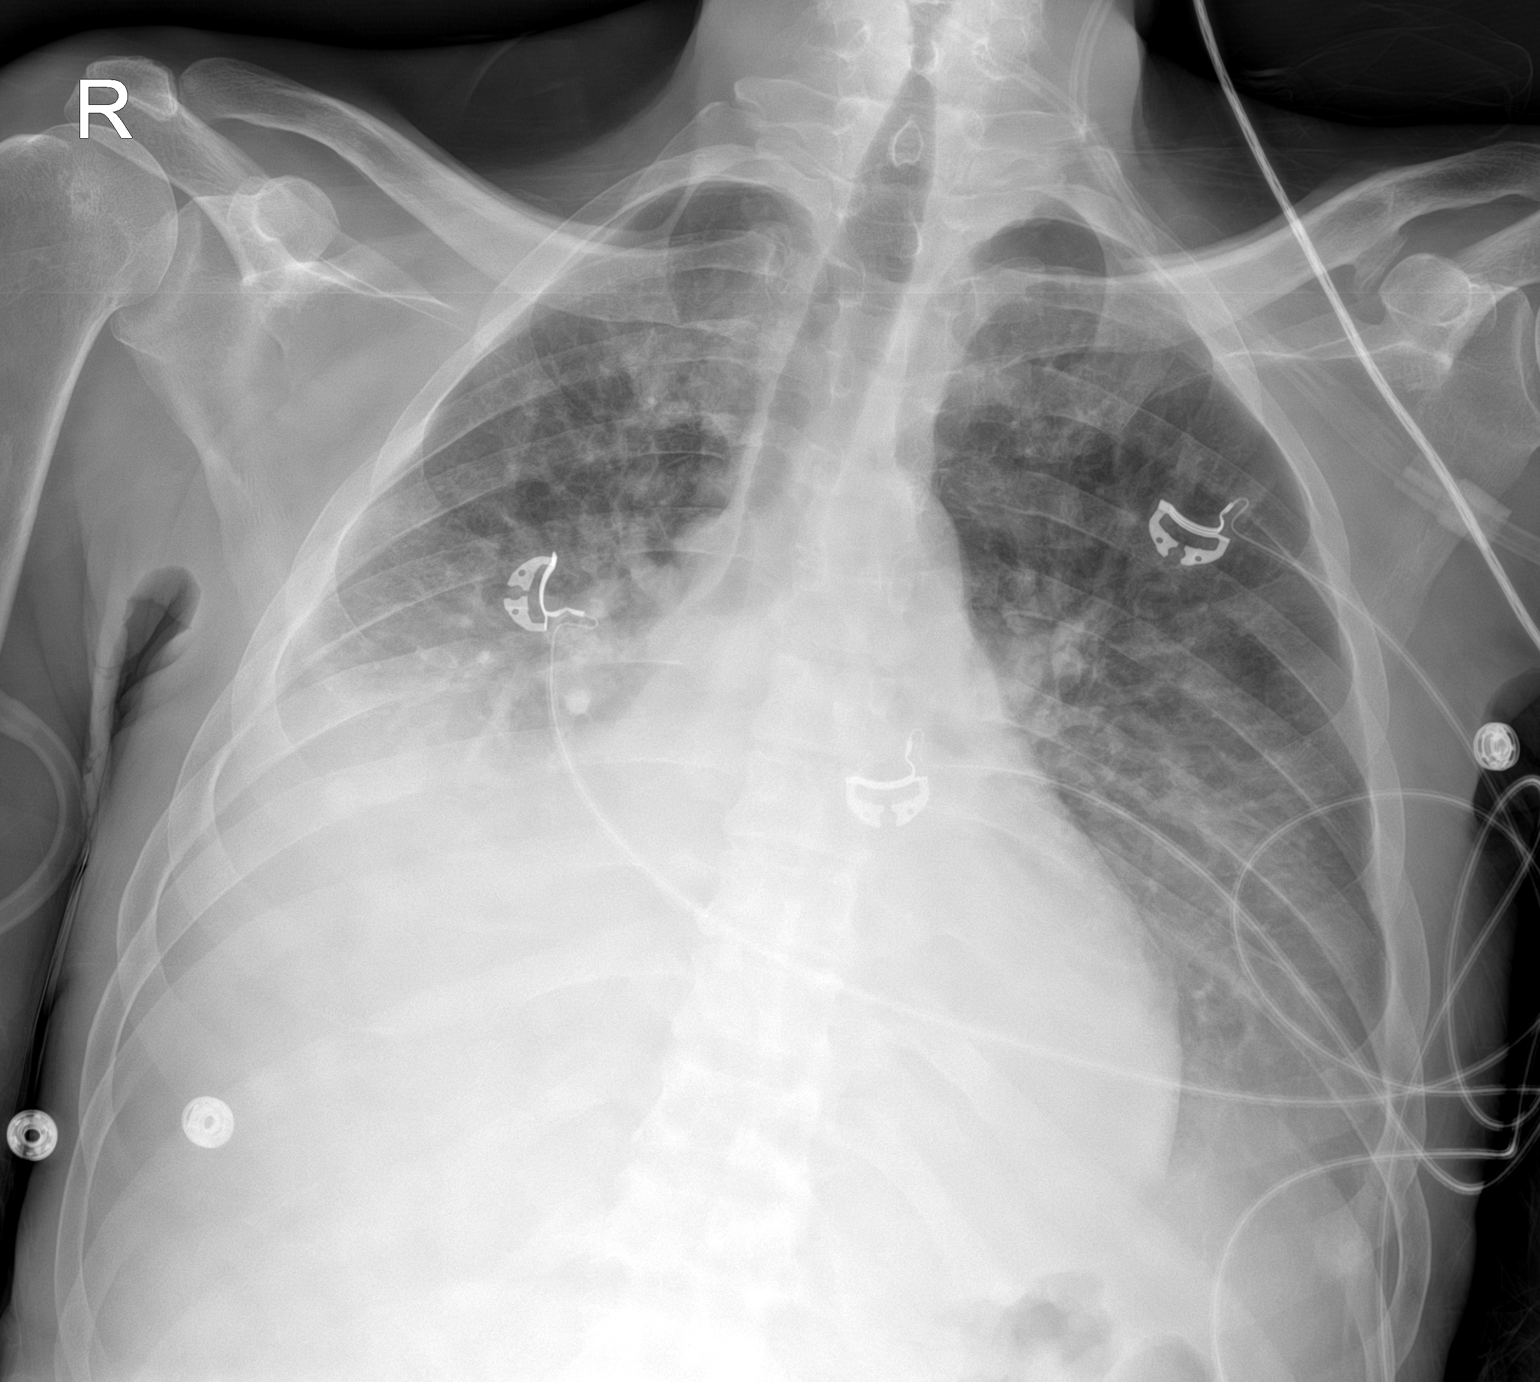

[1 of 1 positions shown; findings below may reference images not displayed]

FINDINGS: Interval removal of the previously seen endotracheal and
transesophageal tubes. There is increasing perihilar and basilar
predominant hazy opacity with pulmonary vascular congestion which
could suggest developing pulmonary edema with layering bilateral
pleural effusions, right greater than left. No visible pneumothorax.
Prominence of the cardiomediastinal silhouette likely related to
portable technique and low volumes. No gross contour abnormality is
seen. No acute osseous or soft tissue abnormality. Degenerative
changes are present in the imaged spine and shoulders. Telemetry
leads overlie the chest.
IMPRESSION: Increasing perihilar and basilar predominant hazy opacity with
pulmonary vascular congestion. Findings are suggestive of
developing/worsening pulmonary edema with increasing layering
bilateral pleural effusions, right greater than left.

## 2021-10-05 IMAGING — DX DG CHEST 1V PORT
1 series · 1 of 1 positions shown · non-contrast
Comparison: Film earlier today at [id number] hours

CLINICAL DATA: Stroke, CHF, cardiac arrest and aspiration.

EXAM:
PORTABLE CHEST 1 VIEW

[chest]
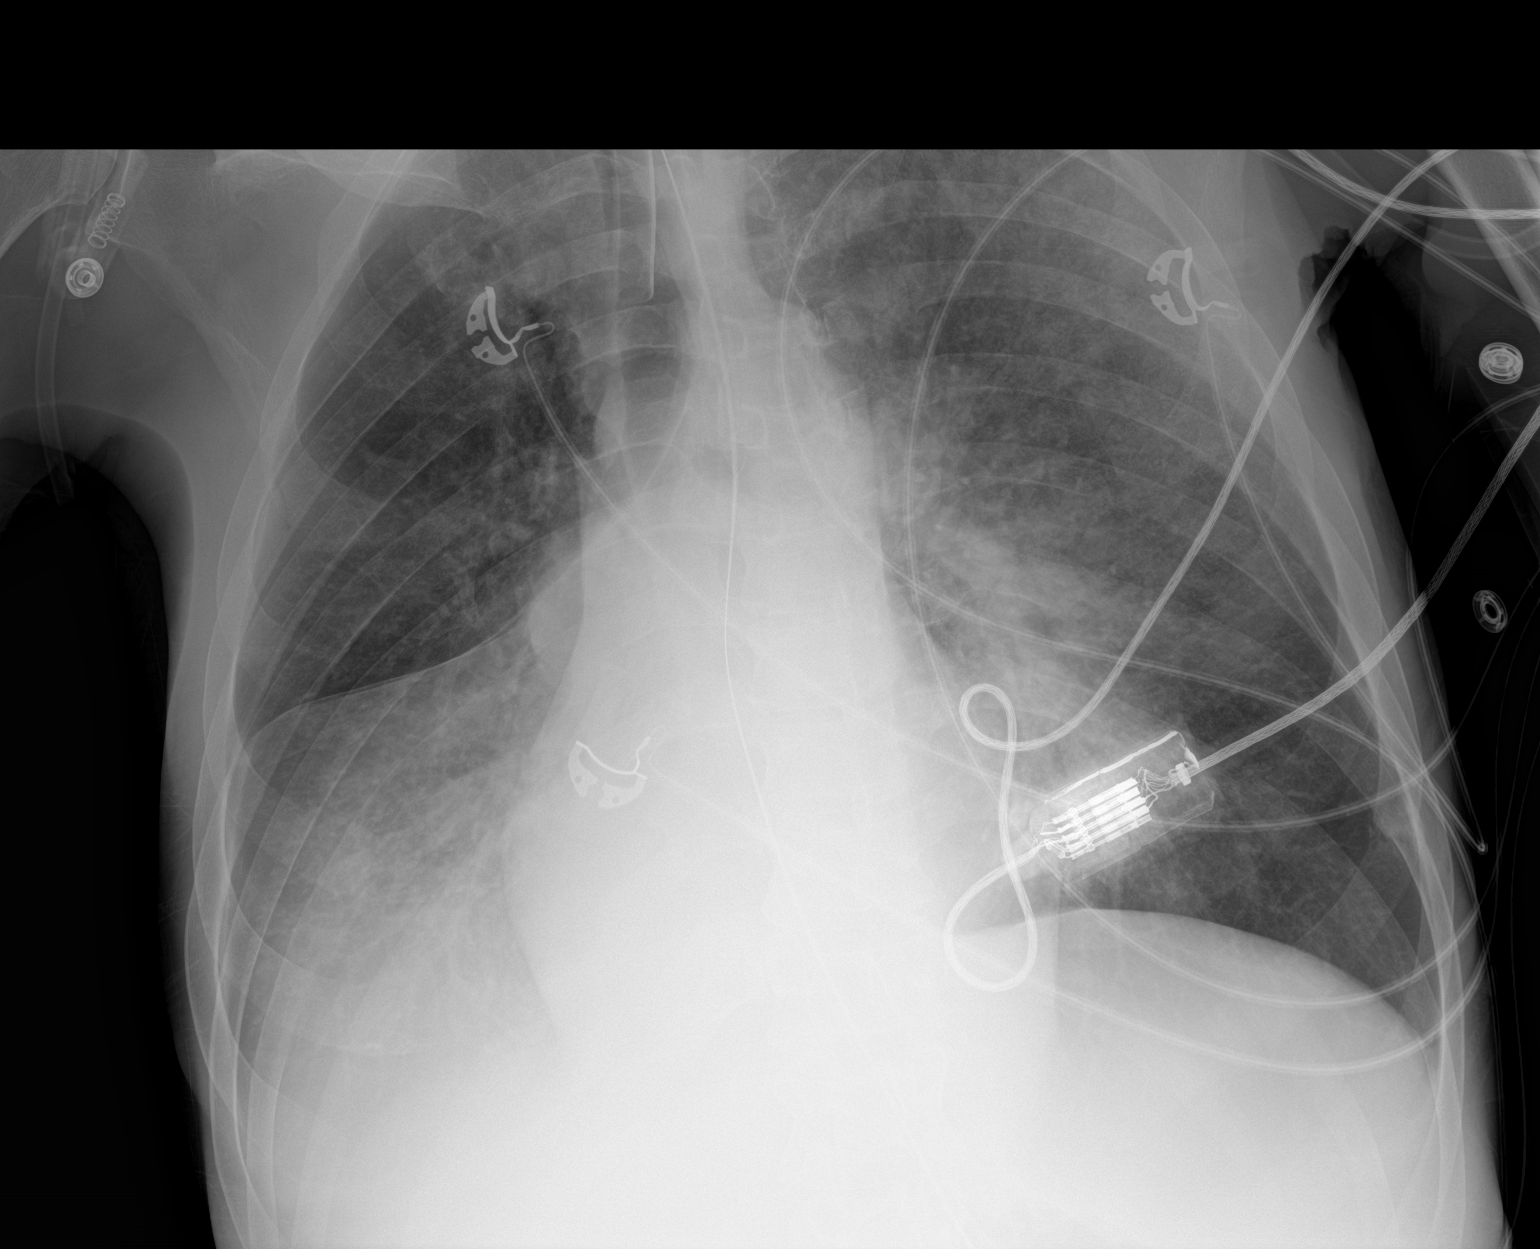

[1 of 1 positions shown; findings below may reference images not displayed]

FINDINGS: Endotracheal tube has now in place with the tip approximately 3.5 cm
above the carina. Gastric decompression tube extends below the
diaphragm. Dense airspace disease of the right lower lobe may be
secondary to aspiration. There is improved aeration of the left lung
with some residual airspace disease and edema remaining. No
pneumothorax or significant pleural fluid.
IMPRESSION: 1. Endotracheal tube tip approximately 3.5 cm above the carina.
Dense airspace disease of the right lower lobe may be secondary to
aspiration. Improved aeration of the left lung with some residual
airspace disease and edema remaining.
2. Gastric decompression tube extends below the diaphragm.

## 2021-10-05 IMAGING — DX DG ABDOMEN 1V
1 series · 1 of 1 positions shown · non-contrast
Comparison: Chest x-ray 10/20/2020.  Abdomen 10/08/2020.

CLINICAL DATA: OG tube placement.

EXAM:
ABDOMEN - 1 VIEW

[abdomen]
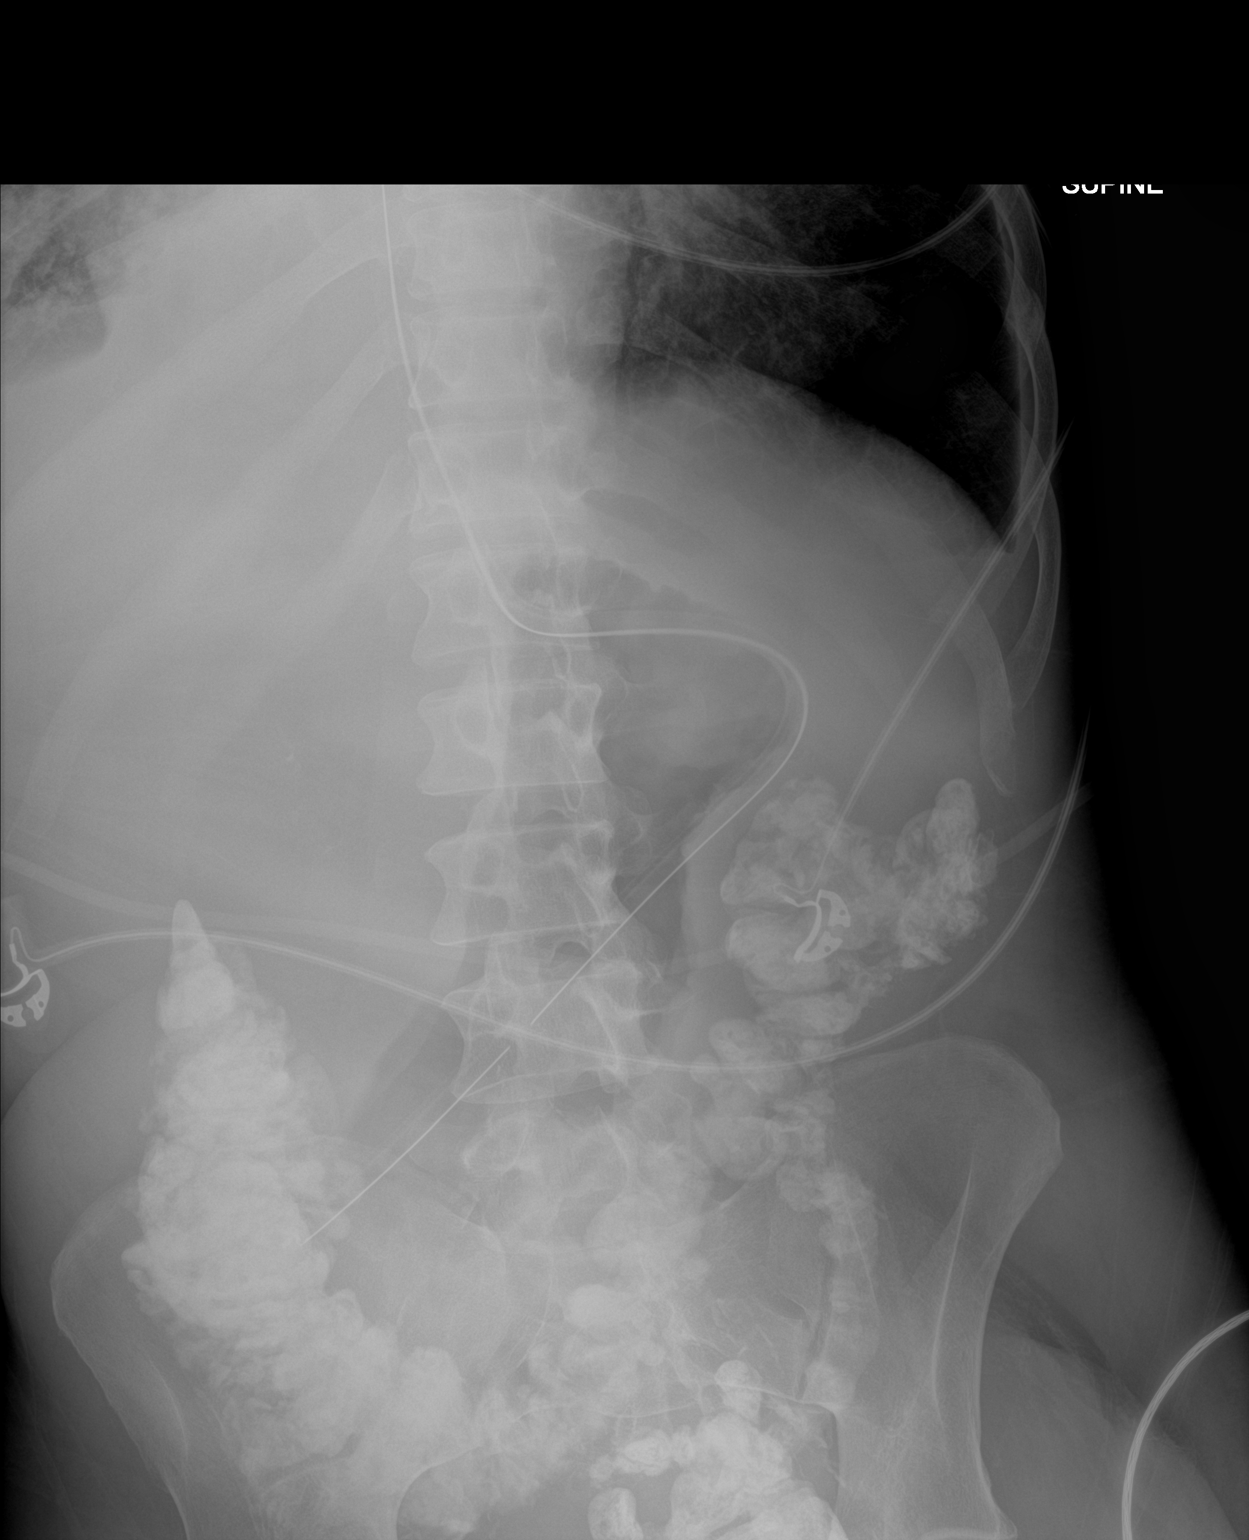

[1 of 1 positions shown; findings below may reference images not displayed]

FINDINGS: OG tube noted over the distal stomach. No bowel distention. Oral
contrast noted throughout the colon. Bibasilar infiltrates. Right
base atelectasis. Probable bilateral pleural effusions.
IMPRESSION: 1. NG tube noted over the distal stomach. No bowel distention. Oral
contrast noted throughout the colon.
2. Bibasilar infiltrates and right base atelectasis. Right pleural
effusion.

## 2021-10-05 IMAGING — DX DG CHEST 1V PORT
1 series · 1 of 1 positions shown · non-contrast
Comparison: 10/20/2020

CLINICAL DATA: Status post right thoracentesis

EXAM:
PORTABLE CHEST 1 VIEW

[chest ap]
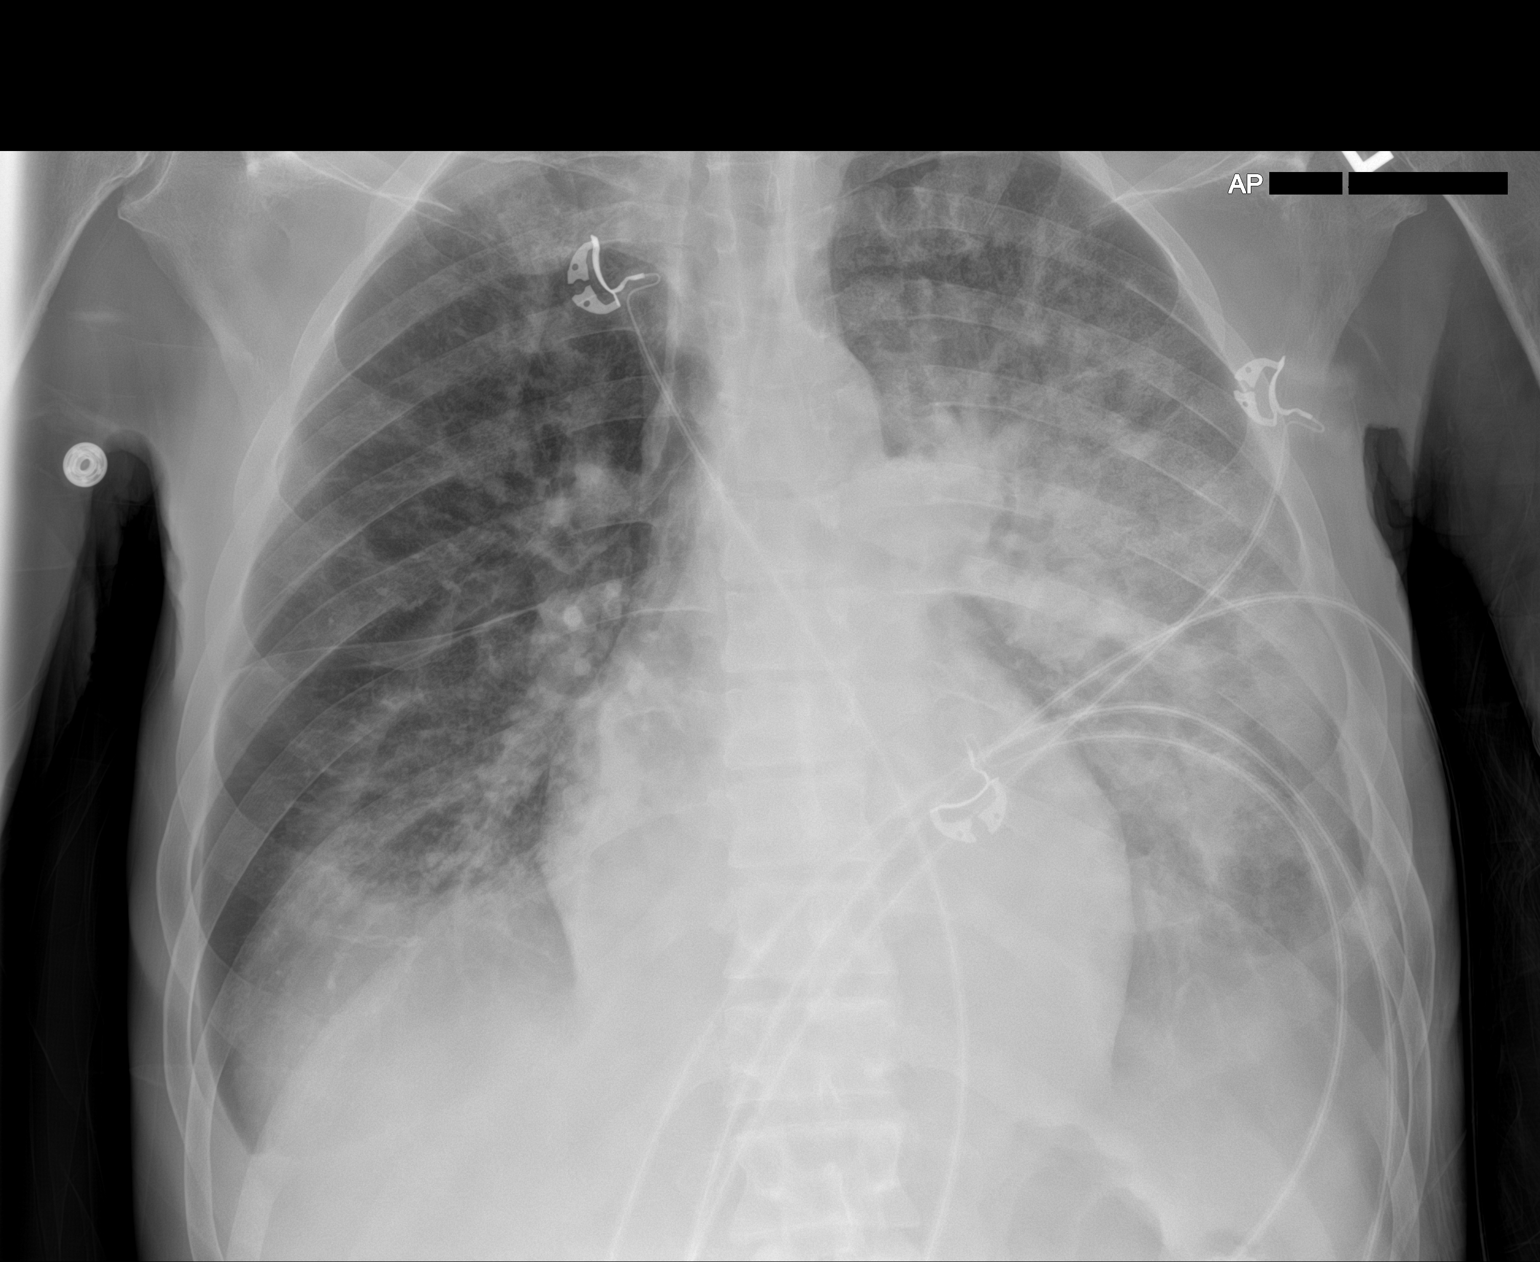

[1 of 1 positions shown; findings below may reference images not displayed]

FINDINGS: Cardiac shadow is stable. Patchy airspace opacity is again
identified throughout the left lung stable in appearance from the
prior exam. Undisplaced left seventh rib fracture is noted of
uncertain chronicity. Interval right-sided thoracentesis has been
performed with significant reduction in right pleural effusion. No
pneumothorax is noted. Mild central vascular congestion is seen.
IMPRESSION: No pneumothorax following right-sided thoracentesis.

Persistent airspace opacity primarily on the left with evidence of
central vascular congestion.
